# Patient Record
Sex: Female | Born: 1945
Health system: Southern US, Community
[De-identification: ages and names within clinical notes are randomized; demographics above are authoritative.]

## PROBLEM LIST (undated history)

## (undated) DIAGNOSIS — L570 Actinic keratosis: Secondary | ICD-10-CM

## (undated) DIAGNOSIS — R7303 Prediabetes: Secondary | ICD-10-CM

## (undated) DIAGNOSIS — J45909 Unspecified asthma, uncomplicated: Secondary | ICD-10-CM

## (undated) DIAGNOSIS — T7840XA Allergy, unspecified, initial encounter: Secondary | ICD-10-CM

## (undated) DIAGNOSIS — F419 Anxiety disorder, unspecified: Secondary | ICD-10-CM

## (undated) DIAGNOSIS — E785 Hyperlipidemia, unspecified: Secondary | ICD-10-CM

## (undated) DIAGNOSIS — I471 Supraventricular tachycardia, unspecified: Secondary | ICD-10-CM

## (undated) DIAGNOSIS — R011 Cardiac murmur, unspecified: Secondary | ICD-10-CM

## (undated) DIAGNOSIS — H269 Unspecified cataract: Secondary | ICD-10-CM

## (undated) DIAGNOSIS — T8859XA Other complications of anesthesia, initial encounter: Secondary | ICD-10-CM

## (undated) HISTORY — DX: Actinic keratosis: L57.0

## (undated) HISTORY — DX: Allergy, unspecified, initial encounter: T78.40XA

## (undated) HISTORY — PX: SKIN CANCER EXCISION: SHX779

## (undated) HISTORY — DX: Prediabetes: R73.03

## (undated) HISTORY — PX: FRACTURE SURGERY: SHX138

## (undated) HISTORY — DX: Hyperlipidemia, unspecified: E78.5

## (undated) HISTORY — DX: Cardiac murmur, unspecified: R01.1

## (undated) HISTORY — DX: Unspecified cataract: H26.9

---

## 1968-08-20 HISTORY — PX: APPENDECTOMY: SHX54

## 1968-08-20 HISTORY — PX: OOPHORECTOMY: SHX86

## 2019-11-10 ENCOUNTER — Ambulatory Visit (INDEPENDENT_AMBULATORY_CARE_PROVIDER_SITE_OTHER): Payer: Medicare Other | Admitting: Primary Care

## 2019-11-10 ENCOUNTER — Other Ambulatory Visit: Payer: Self-pay

## 2019-11-10 ENCOUNTER — Encounter: Payer: Self-pay | Admitting: Primary Care

## 2019-11-10 VITALS — BP 132/76 | HR 74 | Temp 98.2°F | Wt 120.0 lb

## 2019-11-10 DIAGNOSIS — E2839 Other primary ovarian failure: Secondary | ICD-10-CM | POA: Diagnosis not present

## 2019-11-10 DIAGNOSIS — E785 Hyperlipidemia, unspecified: Secondary | ICD-10-CM | POA: Diagnosis not present

## 2019-11-10 DIAGNOSIS — E7849 Other hyperlipidemia: Secondary | ICD-10-CM | POA: Insufficient documentation

## 2019-11-10 DIAGNOSIS — F419 Anxiety disorder, unspecified: Secondary | ICD-10-CM | POA: Diagnosis not present

## 2019-11-10 DIAGNOSIS — R011 Cardiac murmur, unspecified: Secondary | ICD-10-CM

## 2019-11-10 DIAGNOSIS — Z1231 Encounter for screening mammogram for malignant neoplasm of breast: Secondary | ICD-10-CM

## 2019-11-10 DIAGNOSIS — R768 Other specified abnormal immunological findings in serum: Secondary | ICD-10-CM

## 2019-11-10 LAB — COMPREHENSIVE METABOLIC PANEL
ALT: 19 U/L (ref 0–35)
AST: 22 U/L (ref 0–37)
Albumin: 4.5 g/dL (ref 3.5–5.2)
Alkaline Phosphatase: 122 U/L — ABNORMAL HIGH (ref 39–117)
BUN: 14 mg/dL (ref 6–23)
CO2: 30 mEq/L (ref 19–32)
Calcium: 9.9 mg/dL (ref 8.4–10.5)
Chloride: 102 mEq/L (ref 96–112)
Creatinine, Ser: 0.8 mg/dL (ref 0.40–1.20)
GFR: 70.17 mL/min (ref >60.00)
Glucose, Bld: 111 mg/dL — ABNORMAL HIGH (ref 70–99)
Potassium: 4.6 mEq/L (ref 3.5–5.1)
Sodium: 139 mEq/L (ref 135–145)
Total Bilirubin: 0.6 mg/dL (ref 0.2–1.2)
Total Protein: 7.9 g/dL (ref 6.0–8.3)

## 2019-11-10 LAB — CBC
HCT: 43 % (ref 36.0–46.0)
Hemoglobin: 14.2 g/dL (ref 12.0–15.0)
MCHC: 33.1 g/dL (ref 30.0–36.0)
MCV: 86.8 fl (ref 78.0–100.0)
Platelets: 248 10*3/uL (ref 150.0–400.0)
RBC: 4.95 Mil/uL (ref 3.87–5.11)
RDW: 13.8 % (ref 11.5–15.5)
WBC: 7.3 10*3/uL (ref 4.0–10.5)

## 2019-11-10 LAB — LIPID PANEL
Cholesterol: 339 mg/dL — ABNORMAL HIGH (ref 0–200)
HDL: 91.5 mg/dL (ref >39.00)
LDL Cholesterol: 228 mg/dL — ABNORMAL HIGH (ref 0–99)
NonHDL: 247.6
Total CHOL/HDL Ratio: 4
Triglycerides: 97 mg/dL (ref 0.0–149.0)
VLDL: 19.4 mg/dL (ref 0.0–40.0)

## 2019-11-10 NOTE — Assessment & Plan Note (Signed)
Evident on exam today, patient has no known history.  Echocardiogram pending.  Appears well. No exertional shortness of breath but some decreased "stamina"

## 2019-11-10 NOTE — Assessment & Plan Note (Signed)
Chronic for years, last labs with LDL of 184 in 2019. Refuses statin therapy.  Repeat lipids pending. Family history of stroke and hyperlipidemia in her mother.  Discussed potential effects of uncontrolled hyperlipidemia.

## 2019-11-10 NOTE — Patient Instructions (Addendum)
Stop by the lab prior to leaving today. I will notify you of your results once received.   Call the [facility] to schedule your mammogram and bone density scan.   You will be contacted regarding your echocardiogram.  Please let us know if you have not been contacted within two weeks.   It was a pleasure to meet you today! Please don't hesitate to call or message me with any questions. Welcome to [facility]!

## 2019-11-10 NOTE — Assessment & Plan Note (Signed)
Chronic and intermittent, mostly during travel. Uses very sparingly. Continue same.

## 2019-11-10 NOTE — Assessment & Plan Note (Signed)
Diagnosed several years ago, most recent reading in August 2019 of 1:640. Repeat ANA pending.

## 2019-11-10 NOTE — Progress Notes (Signed)
Subjective:    Patient ID: Cynthia Collins, female    DOB: September 25, 1945, 74 y.o.   MRN: [id number]  HPI  This visit occurred during the SARS-CoV-2 public health emergency.  Safety protocols were in place, including screening questions prior to the visit, additional usage of staff PPE, and extensive cleaning of exam room while observing appropriate contact time as indicated for disinfecting solutions.   Cynthia Collins is a 74 year old female who presents today to establish care and discuss the problems mentioned below. Will obtain/review records.  1) Hyperlipidemia: Diagnosed a few years ago. Last lipid panel was in August 2019, LDL of 184, HDL of 89, TC of 293. She takes a lot of OTC supplements for hyperlipidemia, against taking statin therapy due to intermittent muscle spasms.   Family history of hyperlipidemia, stroke, carotid artery disease in her mother. She endorses a healthy diet and stays active.   2) Positive ANA: Labs from August 2019 with positive ANA with titer of 1:640.   Once followed with rheumatology, tested negative for Sjogren's disease, Lupus. Family history of rheumatoid arthritis in her mother. Chronic history of muscle spasms that occur with increased activity. Occurs to entire body at times, taking [facility] with improvement. Historically has treated with acupuncture and massage therapy.   3) Agoraphobia/Anxiety: Uses Xanax very infrequently during travel.   BP Readings from Last 3 Encounters:  11/10/19 132/76     Review of Systems  Respiratory: Negative for shortness of breath.   Cardiovascular: Negative for chest pain.  Musculoskeletal: Positive for myalgias. Negative for arthralgias and joint swelling.  Skin: Negative for color change.  Neurological: Negative for dizziness, numbness and headaches.       Past Medical History:  Diagnosis Date  . Allergy   . Hyperlipidemia      Social History   Socioeconomic History  . Marital status: Divorced      Spouse name: Not on file  . Number of children: Not on file  . Years of education: Not on file  . Highest education level: Not on file  Occupational History  . Not on file  Tobacco Use  . Smoking status: Never Smoker  . Smokeless tobacco: Never Used  Substance and Sexual Activity  . Alcohol use: Yes    Alcohol/week: 1.0 standard drinks    Types: 1 Cans of beer per week    Comment: Once a month   . Drug use: Never  . Sexual activity: Not on file  Other Topics Concern  . Not on file  Social History Narrative  . Not on file   Social Determinants of Health   Financial Resource Strain:   . Difficulty of Paying Living Expenses:   Food Insecurity:   . Worried About Charity fundraiser in the Last Year:   . Arboriculturist in the Last Year:   Transportation Needs:   . Film/video editor (Medical):   Marland Kitchen Lack of Transportation (Non-Medical):   Physical Activity:   . Days of Exercise per Week:   . Minutes of Exercise per Session:   Stress:   . Feeling of Stress :   Social Connections:   . Frequency of Communication with Friends and Family:   . Frequency of Social Gatherings with Friends and Family:   . Attends Religious Services:   . Active Member of Clubs or Organizations:   . Attends Archivist Meetings:   Marland Kitchen Marital Status:   Intimate Partner Violence:   .  Fear of Current or Ex-Partner:   . Emotionally Abused:   Marland Kitchen Physically Abused:   . Sexually Abused:     Past Surgical History:  Procedure Laterality Date  . APPENDECTOMY  08/20/1968  . CESAREAN SECTION  08/20/1984  . OOPHORECTOMY Right 08/20/1968    Family History  Problem Relation Age of Onset  . Alcohol abuse Mother   . Arthritis Mother   . Heart disease Mother   . Hypertension Mother   . Mental illness Mother   . Stroke Mother   . Stroke Father   . Heart disease Father   . Hearing loss Father   . Alcohol abuse Sister   . Cancer Sister   . Heart disease Maternal Grandmother   .  Diabetes Maternal Grandfather   . Heart disease Paternal Grandmother     No Known Allergies  Current Outpatient Medications on File Prior to Visit  Medication Sig Dispense Refill  . ALPRAZolam (XANAX) 0.5 MG tablet Take 0.5 mg by mouth at bedtime as needed for anxiety.    . Ascorbic Acid (VITAMIN C) 1000 MG tablet Take 1,000 mg by mouth daily.    Marland Kitchen ELDERBERRY PO Take by mouth daily as needed.     No current facility-administered medications on file prior to visit.    BP 132/76 (BP Location: Left Arm, Patient Position: Sitting, Cuff Size: Small)   Pulse 74   Temp 98.2 F (36.8 C) (Temporal)   Wt 120 lb (54.4 kg)   SpO2 98%    Objective:   Physical Exam  Constitutional: She appears well-nourished.  Cardiovascular: Normal rate and regular rhythm.  Murmur heard. Murmur most prominent over aortic valve  Respiratory: Effort normal and breath sounds normal.  Musculoskeletal:     Cervical back: Neck supple.  Skin: Skin is warm and dry.  Psychiatric: She has a normal mood and affect.           Assessment & Plan:

## 2019-11-11 ENCOUNTER — Other Ambulatory Visit (INDEPENDENT_AMBULATORY_CARE_PROVIDER_SITE_OTHER): Payer: Medicare Other

## 2019-11-11 DIAGNOSIS — R739 Hyperglycemia, unspecified: Secondary | ICD-10-CM

## 2019-11-11 LAB — HEMOGLOBIN A1C: Hgb A1c MFr Bld: 5.8 % (ref 4.6–6.5)

## 2019-11-12 LAB — ANA: Anti Nuclear Antibody (ANA): POSITIVE — AB

## 2019-11-12 LAB — ANTI-NUCLEAR AB-TITER (ANA TITER)
ANA TITER: 1:80 {titer} — ABNORMAL HIGH
ANA Titer 1: 1:80 {titer} — ABNORMAL HIGH

## 2019-11-13 ENCOUNTER — Encounter: Payer: Self-pay | Admitting: *Deleted

## 2019-11-18 ENCOUNTER — Other Ambulatory Visit: Payer: Self-pay

## 2019-11-18 ENCOUNTER — Ambulatory Visit (INDEPENDENT_AMBULATORY_CARE_PROVIDER_SITE_OTHER): Payer: Medicare Other

## 2019-11-18 DIAGNOSIS — R011 Cardiac murmur, unspecified: Secondary | ICD-10-CM | POA: Diagnosis not present

## 2019-11-25 ENCOUNTER — Encounter: Payer: Self-pay | Admitting: Radiology

## 2019-11-25 ENCOUNTER — Ambulatory Visit
Admission: RE | Admit: 2019-11-25 | Discharge: 2019-11-25 | Disposition: A | Discharge status: Home / Self Care | Payer: Medicare Other | Source: Ambulatory Visit | Attending: Primary Care | Admitting: Primary Care

## 2019-11-25 DIAGNOSIS — Z1231 Encounter for screening mammogram for malignant neoplasm of breast: Secondary | ICD-10-CM | POA: Diagnosis present

## 2019-11-25 DIAGNOSIS — E2839 Other primary ovarian failure: Secondary | ICD-10-CM | POA: Diagnosis present

## 2019-11-30 ENCOUNTER — Ambulatory Visit: Payer: Medicare Other

## 2019-11-30 ENCOUNTER — Ambulatory Visit: Payer: Medicare Other | Attending: Internal Medicine

## 2019-11-30 ENCOUNTER — Other Ambulatory Visit: Payer: Self-pay

## 2019-11-30 DIAGNOSIS — Z23 Encounter for immunization: Secondary | ICD-10-CM

## 2019-11-30 NOTE — Progress Notes (Signed)
   Covid-19 Vaccination Clinic  Name:  Cynthia Collins    MRN: [id number] DOB: 11-Oct-1945  11/30/2019  Cynthia Collins was observed post Covid-19 immunization for 15 minutes without incident. She was provided with Vaccine Information Sheet and instruction to access the V-Safe system.   Cynthia Collins was instructed to call 911 with any severe reactions post vaccine: Marland Kitchen Difficulty breathing  . Swelling of face and throat  . A fast heartbeat  . A bad rash all over body  . Dizziness and weakness   Immunizations Administered    Name Date Dose VIS Date Route   Pfizer COVID-19 Vaccine 11/30/2019  7:07 PM 0.3 mL 07/31/2019 Intramuscular   Manufacturer: [place]   Lot: [id number]   [place]: [id number]

## 2019-12-18 ENCOUNTER — Ambulatory Visit: Payer: Medicare Other

## 2019-12-21 ENCOUNTER — Other Ambulatory Visit: Payer: Self-pay | Admitting: Primary Care

## 2019-12-21 ENCOUNTER — Ambulatory Visit (INDEPENDENT_AMBULATORY_CARE_PROVIDER_SITE_OTHER): Payer: Medicare Other | Admitting: Primary Care

## 2019-12-21 ENCOUNTER — Encounter: Payer: Self-pay | Admitting: Primary Care

## 2019-12-21 ENCOUNTER — Other Ambulatory Visit: Payer: Self-pay

## 2019-12-21 VITALS — BP 134/76 | HR 80 | Temp 96.5°F | Wt 118.5 lb

## 2019-12-21 DIAGNOSIS — E785 Hyperlipidemia, unspecified: Secondary | ICD-10-CM

## 2019-12-21 DIAGNOSIS — M810 Age-related osteoporosis without current pathological fracture: Secondary | ICD-10-CM

## 2019-12-21 DIAGNOSIS — M79622 Pain in left upper arm: Secondary | ICD-10-CM | POA: Diagnosis not present

## 2019-12-21 DIAGNOSIS — R011 Cardiac murmur, unspecified: Secondary | ICD-10-CM

## 2019-12-21 DIAGNOSIS — R5383 Other fatigue: Secondary | ICD-10-CM | POA: Diagnosis not present

## 2019-12-21 DIAGNOSIS — Z8249 Family history of ischemic heart disease and other diseases of the circulatory system: Secondary | ICD-10-CM

## 2019-12-21 DIAGNOSIS — R7303 Prediabetes: Secondary | ICD-10-CM

## 2019-12-21 DIAGNOSIS — N644 Mastodynia: Secondary | ICD-10-CM

## 2019-12-21 DIAGNOSIS — F419 Anxiety disorder, unspecified: Secondary | ICD-10-CM

## 2019-12-21 LAB — TSH: TSH: 1.28 u[IU]/mL (ref 0.35–4.50)

## 2019-12-21 NOTE — Patient Instructions (Signed)
You will be contacted regarding your referral to cardiology and the ultrasound of your arm.  Please let us know if you have not been contacted within two weeks.   It was a pleasure to see you today!

## 2019-12-21 NOTE — Assessment & Plan Note (Signed)
Recent echocardiogram grossly unremarkable.

## 2019-12-21 NOTE — Assessment & Plan Note (Signed)
LDL of 228 with HDL of 91.  Continues to refuse statin therapy. Given strong FH of heart disease, stroke, AAA, carotid artery stenosis, will refer to cardiology for evaluation.

## 2019-12-21 NOTE — Telephone Encounter (Signed)
Cynthia Collins, Cynthia Collins.

## 2019-12-21 NOTE — Assessment & Plan Note (Signed)
Noted on Bone Density scan from 2021, declines all Rx treatment despite recommendations. Compliant to calcium and vitamin D, also weight bearing exercise. Continue same.

## 2019-12-21 NOTE — Assessment & Plan Note (Addendum)
Chronic since February 2021, increased since Covid-19 vaccine three weeks ago, non provoked. Screening mammogram in early April 2021 negative.  Exam today without obvious mass to left axilla. Non tender. Will check ultrasound of left axilla.  ECG today with left axis, possible BBB?, no t-wave inversion or ST changes. No old ECG to compare.  Will also send to cardiology for cardiac work up given family history of heart disease/stroke coupled with LDL of 228. She has refused statin therapy on numerous occasions.

## 2019-12-21 NOTE — Progress Notes (Signed)
Subjective:    Patient ID: Cynthia Collins, female    DOB: 08-26-1945, 74 y.o.   MRN: [id number]  HPI  This visit occurred during the SARS-CoV-2 public health emergency.  Safety protocols were in place, including screening questions prior to the visit, additional usage of staff PPE, and extensive cleaning of exam room while observing appropriate contact time as indicated for disinfecting solutions.   Cynthia Collins is a 74 year old female with a history of hyperlipidemia, anxiety, cardiac murmur who presents today with a chief complaint of left axillary pain.   She has a chronic ache to her left upper extremity that began in December 2020 after moving to her new home. Her left axillary pain began after doing some demolition work on her house in February 2021. Over the last several weeks she's noticed increased discomfort to her left axilla with radiation to her left breast. Since then her left upper extremity pain has improved.   She had her Covid-19 vaccination three weeks ago, increased left axillary pain began about two weeks ago. She underwent a screening mammogram in early April 2021 which was negative.   She denies chest pain, nausea. She does admit to daily anxiety with an anxiety attack last week. She does note that she's "deteriorated" over the last year, feels more tired after activities, and has more muscle pain than before. She has a strong family history of stroke, carotid artery stenosis, AAA, and heart disease in both parents and brother.    Review of Systems  Eyes: Negative for visual disturbance.  Respiratory: Negative for shortness of breath.   Cardiovascular: Negative for chest pain.  Gastrointestinal: Negative for nausea.  Musculoskeletal: Positive for myalgias.       Left axillar fullness, left axillary discomfort with radiation across left chest  Neurological: Negative for dizziness.  Psychiatric/Behavioral: The patient is nervous/anxious.        Past Medical  History:  Diagnosis Date  . Allergy   . Hyperlipidemia   . Prediabetes      Social History   Socioeconomic History  . Marital status: Divorced    Spouse name: Not on file  . Number of children: Not on file  . Years of education: Not on file  . Highest education level: Not on file  Occupational History  . Not on file  Tobacco Use  . Smoking status: Never Smoker  . Smokeless tobacco: Never Used  Substance and Sexual Activity  . Alcohol use: Yes    Alcohol/week: 1.0 standard drinks    Types: 1 Cans of beer per week    Comment: Once a month   . Drug use: Never  . Sexual activity: Not on file  Other Topics Concern  . Not on file  Social History Narrative  . Not on file   Social Determinants of Health   Financial Resource Strain:   . Difficulty of Paying Living Expenses:   Food Insecurity:   . Worried About Charity fundraiser in the Last Year:   . Arboriculturist in the Last Year:   Transportation Needs:   . Film/video editor (Medical):   Marland Kitchen Lack of Transportation (Non-Medical):   Physical Activity:   . Days of Exercise per Week:   . Minutes of Exercise per Session:   Stress:   . Feeling of Stress :   Social Connections:   . Frequency of Communication with Friends and Family:   . Frequency of Social Gatherings with Friends and Family:   .  Attends Religious Services:   . Active Member of Clubs or Organizations:   . Attends Archivist Meetings:   Marland Kitchen Marital Status:   Intimate Partner Violence:   . Fear of Current or Ex-Partner:   . Emotionally Abused:   Marland Kitchen Physically Abused:   . Sexually Abused:     Past Surgical History:  Procedure Laterality Date  . APPENDECTOMY  08/20/1968  . CESAREAN SECTION  08/20/1984  . OOPHORECTOMY Right 08/20/1968    Family History  Problem Relation Age of Onset  . Alcohol abuse Mother   . Arthritis Mother   . Heart disease Mother   . Hypertension Mother   . Mental illness Mother   . Stroke Mother   .  Hypothyroidism Mother   . Stroke Father   . Heart disease Father   . Hearing loss Father   . Alcohol abuse Sister   . Lung cancer Sister   . Graves' disease Sister   . Heart disease Maternal Grandmother   . Diabetes Maternal Grandfather   . Heart disease Paternal Grandmother   . Breast cancer Neg Hx     No Known Allergies  Current Outpatient Medications on File Prior to Visit  Medication Sig Dispense Refill  . ALPRAZolam (XANAX) 0.5 MG tablet Take 0.5 mg by mouth at bedtime as needed for anxiety.    . Ascorbic Acid (VITAMIN C) 1000 MG tablet Take 1,000 mg by mouth daily.    Marland Kitchen ELDERBERRY PO Take by mouth daily as needed.    . Hyaluronic Acid-Vitamin C (HYALURONIC ACID PO) Take 100 mg by mouth daily.    . Misc Natural Products (LUTEIN 20 PO) Take 20 mg by mouth daily.    . Misc Natural Products (WHITE WILLOW BARK PO) Take by mouth. Patient states she takes this for pain PRN    . Nattokinase 100 MG CAPS Take 100 mg by mouth daily.    . NON FORMULARY Take 1 drop by mouth daily. [employer] Supreme    . RESVERATROL PO Take 1 capsule by mouth 3 (three) times a week.    . Zinc 50 MG CAPS Take 50 mg by mouth as needed.     No current facility-administered medications on file prior to visit.    BP 134/76   Pulse 80   Temp (!) 96.5 F (35.8 C) (Temporal)   Wt 118 lb 8 oz (53.8 kg)   SpO2 98%    Objective:   Physical Exam  Constitutional: She appears well-nourished.  Neck: Carotid bruit is present.  Cardiovascular: Normal rate and regular rhythm.  Murmur heard. Respiratory: Effort normal and breath sounds normal.  Musculoskeletal:     Cervical back: Neck supple.  Skin: Skin is warm and dry.  Psychiatric: She has a normal mood and affect.           Assessment & Plan:

## 2019-12-21 NOTE — Assessment & Plan Note (Signed)
Chronic over the last year which is a change from her normal. Family history of thyroid disease in sister. Checking TSH today.

## 2019-12-22 ENCOUNTER — Encounter: Payer: Self-pay | Admitting: Cardiovascular Disease

## 2019-12-22 ENCOUNTER — Ambulatory Visit (INDEPENDENT_AMBULATORY_CARE_PROVIDER_SITE_OTHER): Payer: Medicare Other | Admitting: Cardiovascular Disease

## 2019-12-22 VITALS — BP 160/74 | HR 78 | Ht 60.0 in | Wt 119.4 lb

## 2019-12-22 DIAGNOSIS — E785 Hyperlipidemia, unspecified: Secondary | ICD-10-CM | POA: Diagnosis not present

## 2019-12-22 DIAGNOSIS — Z136 Encounter for screening for cardiovascular disorders: Secondary | ICD-10-CM

## 2019-12-22 NOTE — Progress Notes (Signed)
Cardiology Office Note   Date:  12/23/2019   ID:  Cynthia Collins, DOB Mar 18, 1946, MRN [id number]  PCP:  Pleas Koch, NP  Cardiologist:   Kathlyn Sacramento, MD   Chief Complaint  Patient presents with  . Other    Left arm pain, HLD, heart murmur and Fam Hx CAD.      History of Present Illness: Cynthia Collins is a 74 y.o. female who referred by Alma Friendly for evaluation of cardiovascular disease given known history of severe hyperlipidemia.  Patient has no previous cardiac history.  She moved from [place] to be closer to her son.  She has known history of anxiety for many years and has known white coat syndrome.  She also has history of hyperlipidemia with elevated LDL and an HDL.  Her LDL is greater than 200.  She has history of positive ANA and suffers from chronic aches and pains and thus she refuses to go on a statin.  There is family history of cardiovascular disease.  Her father died of myocardial infarction.  Her mother had carotid disease and died of heart failure.  She is very active and walks 4 miles a day.  She was found to have a faint heart murmur and had an echocardiogram done in March which showed no significant valvular abnormalities.  She received COVID-19 vaccine about a month ago and had some worsening of pain in the left axilla 2 weeks after that and thus she is now hesitant about taking the second dose.    Past Medical History:  Diagnosis Date  . Allergy   . Heart murmur   . Hyperlipidemia   . Prediabetes     Past Surgical History:  Procedure Laterality Date  . APPENDECTOMY  08/20/1968  . CESAREAN SECTION  08/20/1984  . OOPHORECTOMY Right 08/20/1968     Current Outpatient Medications  Medication Sig Dispense Refill  . ALPRAZolam (XANAX) 0.5 MG tablet Take 0.5 mg by mouth at bedtime as needed for anxiety.    . Ascorbic Acid (VITAMIN C PO) Take by mouth daily.    . Ascorbic Acid (VITAMIN C) 1000 MG tablet Take 1,000 mg by mouth daily.      . Calcium Carbonate-Vitamin D (LIQUID CALCIUM/VITAMIN D PO) Take by mouth daily.    . Coenzyme Q10 (CO Q 10 PO) Take by mouth. Three times weekly.    Marland Kitchen ELDERBERRY PO Take by mouth daily as needed.    . Hyaluronic Acid-Vitamin C (HYALURONIC ACID PO) Take 100 mg by mouth daily.    . Menaquinone-7 (VITAMIN K2 PO) Take by mouth. 3 DAYS WEEKLY    . Misc Natural Products (LUTEIN 20 PO) Take 20 mg by mouth daily.    . Misc Natural Products (WHITE WILLOW BARK PO) Take by mouth. Patient states she takes this for pain PRN    . Multiple Vitamin (MULTIVITAMIN) tablet Take 1 tablet by mouth daily.    . Nattokinase 100 MG CAPS Take 100 mg by mouth daily.    . NON FORMULARY Take 1 drop by mouth daily. [employer] Supreme    . NON FORMULARY Bone Up 1 capsule bid.    . NON FORMULARY BCQ 3 days a week.    . Omega-3 Fatty Acids (FISH OIL PO) Take by mouth. Three times a week.    Marland Kitchen RESVERATROL PO Take 1 capsule by mouth 3 (three) times a week.    . Zinc 50 MG CAPS Take 50 mg by mouth as needed.  No current facility-administered medications for this visit.    Allergies:   Patient has no known allergies.    Social History:  The patient  reports that she has never smoked. She has never used smokeless tobacco. She reports current alcohol use of about 1.0 standard drinks of alcohol per week. She reports that she does not use drugs.   Family History:  The patient's family history includes Alcohol abuse in her mother and sister; Arthritis in her mother; Diabetes in her maternal grandfather; Berenice Primas' disease in her sister; Hearing loss in her father; Heart disease in her father, maternal grandmother, mother, and paternal grandmother; Hypertension in her mother; Hypothyroidism in her mother; Lung cancer in her sister; Mental illness in her mother; Stroke in her father and mother.    ROS:  Please see the history of present illness.   Otherwise, review of systems are positive for none.   All other systems are reviewed  and negative.    PHYSICAL EXAM: VS:  BP (!) 160/74 (BP Location: Right Arm, Patient Position: Sitting, Cuff Size: Normal)   Pulse 78   Ht 5' (1.524 m)   Wt 119 lb 6 oz (54.1 kg)   SpO2 98%   BMI 23.31 kg/m  , BMI Body mass index is 23.31 kg/m. GEN: Well nourished, well developed, in no acute distress  HEENT: normal  Neck: no JVD, carotid bruits, or masses Cardiac: RRR; no rubs, or gallops,no edema .  1 out of 6 systolic murmur at the base Respiratory:  clear to auscultation bilaterally, normal work of breathing GI: soft, nontender, nondistended, + BS MS: no deformity or atrophy  Skin: warm and dry, no rash Neuro:  Strength and sensation are intact Psych: euthymic mood, full affect   EKG:  EKG is ordered today. The ekg ordered today demonstrates normal sinus rhythm with left axis deviation   Recent Labs: 11/10/2019: ALT 19; BUN 14; Creatinine, Ser 0.80; Hemoglobin 14.2; Platelets 248.0; Potassium 4.6; Sodium 139 12/21/2019: TSH 1.28    Lipid Panel    Component Value Date/Time   CHOL 339 (H) 11/10/2019 1055   TRIG 97.0 11/10/2019 1055   HDL 91.50 11/10/2019 1055   CHOLHDL 4 11/10/2019 1055   VLDL 19.4 11/10/2019 1055   LDLCALC 228 (H) 11/10/2019 1055      Wt Readings from Last 3 Encounters:  12/22/19 119 lb 6 oz (54.1 kg)  12/21/19 118 lb 8 oz (53.8 kg)  11/10/19 120 lb (54.4 kg)       PAD Screen 12/22/2019  Previous PAD dx? No  Previous surgical procedure? No  Pain with walking? No  Feet/toe relief with dangling? No  Painful, non-healing ulcers? No  Extremities discolored? No      ASSESSMENT AND PLAN:  1.  Screening for cardiovascular disease: The patient is suspected of having familial hyperlipidemia given extremely elevated LDL.  I discussed different options for screening for heart disease I think the best test is coronary calcium score.  She is agreeable to proceed.  This test will help [place] risk stratify her management.  2.  Pain in the left axillary  area: The patient is getting further work-up with ultrasound and mammogram.  This is not cardiac.  3.  Familial hyperlipidemia: LDL was 228 but at the same time her HDL was 92.  Risk stratify with a coronary calcium score as outlined above.  The patient has refused statins in the past and reports that she had myalgia with supplements to treat hyperlipidemia likely containing  red yeast rice.  I explained to her that there are different options for treating hyperlipidemia including Zetia and PCSK9 inhibitors .  4. Health maintenance: I had a long discussion with the patient about the advantages of taking the second dose of COVID-19 infection   Disposition:   FU with me in 1 month  Signed,  Kathlyn Sacramento, MD  12/23/2019 4:10 PM    [place]

## 2019-12-22 NOTE — Patient Instructions (Signed)
Medication Instructions:  Your physician recommends that you continue on your current medications as directed. Please refer to the Current Medication list given to you today.  *If you need a refill on your cardiac medications before your next appointment, please call your pharmacy*   Lab Work: None ordered If you have labs (blood work) drawn today and your tests are completely normal, you will receive your results only by: Marland Kitchen MyChart Message (if you have MyChart) OR . A paper copy in the mail If you have any lab test that is abnormal or we need to change your treatment, we will call you to review the results.   Testing/Procedures: Dr. Fletcher Anon has recommended you have a CT Calcium Score. The out of pocket cost is $150.  Please call [phone] to schedule  [place], [place]   Follow-Up: At [facility], you and your health needs are our priority.  As part of our continuing mission to provide you with exceptional heart care, we have created designated Provider Care Teams.  These Care Teams include your primary Cardiologist (physician) and Advanced Practice Providers (APPs -  Physician Assistants and Nurse Practitioners) who all work together to provide you with the care you need, when you need it.  We recommend signing up for the patient portal called "MyChart".  Sign up information is provided on this After Visit Summary.  MyChart is used to connect with patients for Virtual Visits (Telemedicine).  Patients are able to view lab/test results, encounter notes, upcoming appointments, etc.  Non-urgent messages can be sent to your provider as well.   To learn more about what you can do with MyChart, go to [email or web address].    Your next appointment:   4 week(s)  The format for your next appointment:   In Person  Provider:    You may see No primary care provider on file. Dr. Fletcher Anon or one of the following Advanced  Practice Providers on your designated Care Team:    Murray Hodgkins, NP  Christell Faith, PA-C  Marrianne Mood, PA-C    Other Instructions Dr.Arida has ordered a CT coronary calcium score. This is $150 out of pocket.   Coronary CalciumScan A coronary calcium scan is an imaging test used to look for deposits of calcium and other fatty materials (plaques) in the inner lining of the blood vessels of the heart (coronary arteries). These deposits of calcium and plaques can partly clog and narrow the coronary arteries without producing any symptoms or warning signs. This puts a person at risk for a heart attack. This test can detect these deposits before symptoms develop. Tell a health care provider about:  Any allergies you have.  All medicines you are taking, including vitamins, herbs, eye drops, creams, and over-the-counter medicines.  Any problems you or family members have had with anesthetic medicines.  Any blood disorders you have.  Any surgeries you have had.  Any medical conditions you have.  Whether you are pregnant or may be pregnant. What are the risks? Generally, this is a safe procedure. However, problems may occur, including:  Harm to a pregnant woman and her unborn baby. This test involves the use of radiation. Radiation exposure can be dangerous to a pregnant woman and her unborn baby. If you are pregnant, you generally should not have this procedure done.  Slight increase in the risk of cancer. This is because of the radiation involved in the test. What happens before the procedure? No  preparation is needed for this procedure. What happens during the procedure?  You will undress and remove any jewelry around your neck or chest.  You will put on a hospital gown.  Sticky electrodes will be placed on your chest. The electrodes will be connected to an electrocardiogram (ECG) machine to record a tracing of the electrical activity of your heart.  A CT scanner will  take pictures of your heart. During this time, you will be asked to lie still and hold your breath for 2-3 seconds while a picture of your heart is being taken. The procedure may vary among health care providers and hospitals. What happens after the procedure?  You can get dressed.  You can return to your normal activities.  It is up to you to get the results of your test. Ask your health care provider, or the department that is doing the test, when your results will be ready. Summary  A coronary calcium scan is an imaging test used to look for deposits of calcium and other fatty materials (plaques) in the inner lining of the blood vessels of the heart (coronary arteries).  Generally, this is a safe procedure. Tell your health care provider if you are pregnant or may be pregnant.  No preparation is needed for this procedure.  A CT scanner will take pictures of your heart.  You can return to your normal activities after the scan is done. This information is not intended to replace advice given to you by your health care provider. Make sure you discuss any questions you have with your health care provider. Document Released: 02/02/2008 Document Revised: 06/25/2016 Document Reviewed: 06/25/2016 Elsevier Interactive Patient Education  2017 [employer].

## 2019-12-28 ENCOUNTER — Ambulatory Visit
Admission: RE | Admit: 2019-12-28 | Discharge: 2019-12-28 | Disposition: A | Discharge status: Home / Self Care | Payer: Medicare Other | Source: Ambulatory Visit | Attending: Cardiovascular Disease | Admitting: Cardiovascular Disease

## 2019-12-28 ENCOUNTER — Other Ambulatory Visit: Payer: Self-pay

## 2019-12-28 DIAGNOSIS — E785 Hyperlipidemia, unspecified: Secondary | ICD-10-CM | POA: Insufficient documentation

## 2019-12-29 ENCOUNTER — Ambulatory Visit
Admission: RE | Admit: 2019-12-29 | Discharge: 2019-12-29 | Disposition: A | Discharge status: Home / Self Care | Payer: Medicare Other | Source: Ambulatory Visit | Attending: Primary Care | Admitting: Primary Care

## 2019-12-29 DIAGNOSIS — N644 Mastodynia: Secondary | ICD-10-CM | POA: Diagnosis present

## 2019-12-29 DIAGNOSIS — M79622 Pain in left upper arm: Secondary | ICD-10-CM | POA: Diagnosis not present

## 2019-12-30 ENCOUNTER — Ambulatory Visit: Payer: Medicare Other | Attending: Internal Medicine

## 2019-12-30 ENCOUNTER — Ambulatory Visit: Payer: Medicare Other

## 2019-12-30 DIAGNOSIS — Z23 Encounter for immunization: Secondary | ICD-10-CM

## 2019-12-30 NOTE — Progress Notes (Signed)
   Covid-19 Vaccination Clinic  Name:  Cynthia Collins    MRN: [id number] DOB: 1946-06-15  12/30/2019  [name] was observed post Covid-19 immunization for 15 minutes without incident. She was provided with Vaccine Information Sheet and instruction to access the V-Safe system.   [name] was instructed to call 911 with any severe reactions post vaccine: Marland Kitchen Difficulty breathing  . Swelling of face and throat  . A fast heartbeat  . A bad rash all over body  . Dizziness and weakness   Immunizations Administered    Name Date Dose VIS Date Route   Pfizer COVID-19 Vaccine 12/30/2019  1:58 PM 0.3 mL 10/14/2018 Intramuscular   Manufacturer: [place]   Lot: [id number]   Platinum: [id number]

## 2019-12-31 ENCOUNTER — Telehealth: Payer: Self-pay

## 2019-12-31 DIAGNOSIS — E785 Hyperlipidemia, unspecified: Secondary | ICD-10-CM

## 2019-12-31 MED ORDER — EZETIMIBE 10 MG PO TABS
10.0000 mg | ORAL_TABLET | Freq: Every day | ORAL | 5 refills | Status: DC
Start: 2019-12-31 — End: 2020-03-08

## 2019-12-31 NOTE — Telephone Encounter (Signed)
Patient made aware of her  coronary calcium score results and Dr. Tyrell Antonio recommendation. Patient is agreeable with starting Zetia 10 mg daily. Patient will [facility] her fasting lipid and [facility]  Patient will keep her 01/19/20 appt with Laurann Montana, NP. She will discuss proceeding with a stress test at her appt, she sts that she is not opposed to it. Patient verbalized understanding and voiced appreciation for the call.

## 2019-12-31 NOTE — Telephone Encounter (Signed)
-----   Message from Wellington Hampshire, MD sent at 12/30/2019  3:36 PM EDT ----- Inform patient that coronary calcium score was extremely high.  We have to treat her hyperlipidemia.  She reports inability to take statins.  Recommend adding Zetia 10 mg daily and repeat lipid and liver profile in 2 months.  I suspect that ultimately she will require treatment with a PCSK9 inhibitor. We should also consider doing a stress test to make sure she has no obstructive disease given the previous calcifications.  This can be discussed with her during his follow-up appointment with [facility] in few weeks.

## 2020-01-04 MED ORDER — SERTRALINE HCL 50 MG PO TABS: 50.0000 mg | ORAL_TABLET | Freq: Every day | ORAL | 1 refills | Status: DC

## 2020-01-19 ENCOUNTER — Encounter: Payer: Self-pay | Admitting: Family

## 2020-01-19 ENCOUNTER — Other Ambulatory Visit: Payer: Self-pay

## 2020-01-19 ENCOUNTER — Ambulatory Visit (INDEPENDENT_AMBULATORY_CARE_PROVIDER_SITE_OTHER): Payer: Medicare Other | Admitting: Family

## 2020-01-19 VITALS — BP 146/90 | HR 83 | Ht 60.0 in | Wt 119.5 lb

## 2020-01-19 DIAGNOSIS — E785 Hyperlipidemia, unspecified: Secondary | ICD-10-CM

## 2020-01-19 DIAGNOSIS — Z8249 Family history of ischemic heart disease and other diseases of the circulatory system: Secondary | ICD-10-CM

## 2020-01-19 DIAGNOSIS — I2510 Atherosclerotic heart disease of native coronary artery without angina pectoris: Secondary | ICD-10-CM | POA: Diagnosis not present

## 2020-01-19 DIAGNOSIS — F419 Anxiety disorder, unspecified: Secondary | ICD-10-CM

## 2020-01-19 MED ORDER — ASPIRIN EC 81 MG PO TBEC
81.0000 mg | DELAYED_RELEASE_TABLET | Freq: Every day | ORAL | 0 refills | Status: DC
Start: 2020-01-19 — End: 2020-11-08

## 2020-01-19 NOTE — Progress Notes (Signed)
Office Visit    Patient Name: Cynthia Collins Date of Encounter: 01/19/2020  Primary Care Provider:  Pleas Koch, NP Primary Cardiologist:  Kathlyn [place], MD Electrophysiologist:  None   Chief Complaint    Cynthia Collins is a 74 y.o. female with a hx of severe hyperlipidemia, anxiety, whitecoat syndrome, family history notable for CAD presents today for follow-up after cardiac CTA  Past Medical History    Past Medical History:  Diagnosis Date  . Allergy   . Heart murmur   . Hyperlipidemia   . Prediabetes    Past Surgical History:  Procedure Laterality Date  . APPENDECTOMY  08/20/1968  . CESAREAN SECTION  08/20/1984  . OOPHORECTOMY Right 08/20/1968    Allergies  No Known Allergies  History of Present Illness    Cynthia Collins is a 74 y.o. female with a hx of  severe hyperlipidemia, anxiety, whitecoat syndrome, heart murmur with normal echo 10/2019, family history notable for CAD.  She was last seen 12/22/2019 by Dr. Fletcher Anon.  Family history notable for CAD with father having passed of MI.  Her mother had carotid disease and died of heart failure.  Echocardiogram 10/2019 in setting of faint heart murmur showed no significant valvular normalities.  She was referred to Dr. Fletcher Anon for evaluation of cardiovascular disease given severe hyperlipidemia.  Her LDL is greater than 200.  She has chronic aches and pain and has refused statin. Recommended for coronary calcium score - showed coronary calcium score 1118 placing her in 96th percentile for age/sex. Recommended for treatment of HLD.   She is active and walks 4 miles per day without exertional dyspnea or chest pain.  ANA score 1280 a number of years ago. Saw rheumatologist and all testing was negative. Tells me her muscle system that hurts presently is her left arm and between her shoulder blades that has been ongoing for 6 months. Previously managed with therapist, massage therapist, and acupuncturist.  Has not  reestablished with these provider since moving to the area.  BP at home 113/67. Monitors carefully at home with arm blood pressure cuff.   Tells me she was fine until she had testing done but now notes that she has some anxiety about her heart. Has discussed with her niece who she is close with.   Works out daily with 10 pound weights. Tells me last week she had a vertical sense of "discomfort" in her mid sternum that sent her into anxiety that lasted 5 minutes.  Occurred mid exercise routine but has not recurred since.  List of multiple questions does endorse she is anxious.  Encouraged her to continue to take PO calcium as recommended by PCP for low bone density studies.  We discussed oral calcium would not contribute to coronary calcification.  In discussing cholesterol she asks if she can eat 1 egg per day, told her this was fine.  Tells me she previously thought her cholesterol was fine since her HDL was always good. Cut back on fish oil to 3 days per week when starting Zetia at her own discretion. Tells me her HDL went up after being on fish oil. Presently on 3 days per week.  Has used xanax for >40 years. Family history notable for addiction per her report. Tells me she uses xanax as a "safety blanket".  Was very hesitant regarding the Zoloft that her primary care provider for her.  Long discussion regarding the difference between long-acting anxiety medications and rescue medications.  Encouraged her to trial.  We discussed that it may take 4 to 6 weeks for her to notice a difference.  Encouraged to follow closely with her primary care provider.  We discussed the limitations on primary care providers regarding Xanax and encouraged her to request referral to psychiatry if she feels she needs one for Xanax.  No suicidal ideation noted today.     EKGs/Labs/Other Studies Reviewed:   The following studies were reviewed today:  Echo 11/18/19 1. Left ventricular ejection fraction, by  estimation, is 55 to 60%. The  left ventricle has normal function. The left ventricle has no regional  wall motion abnormalities. Left ventricular diastolic parameters are  consistent with Grade I diastolic  dysfunction (impaired relaxation).   2. Right ventricular systolic function is normal. The right ventricular  size is normal. There is normal pulmonary artery systolic pressure.   3. The mitral valve is normal in structure. No evidence of mitral valve  regurgitation. No evidence of mitral stenosis.   4. The aortic valve is tricuspid. Aortic valve regurgitation is not  visualized. Mild aortic valve sclerosis is present, with no evidence of  aortic valve stenosis.   5. The inferior vena cava is normal in size with greater than 50%  respiratory variability, suggesting right atrial pressure of 3 mmHg.   CT calcium score 12/2019 IMPRESSION: 1. Coronary calcium score of 1118 . This was 96th percentile for age and sex matched control.   2. High calcium score. Recommend guideline directed medical therapy and risk factor modification.  EKG:  EKG is ordered today.  The ekg ordered today demonstrates NSR 83 bpm with left axis deviation no acute ST/T wave changes  Recent Labs: 11/10/2019: ALT 19; BUN 14; Creatinine, Ser 0.80; Hemoglobin 14.2; Platelets 248.0; Potassium 4.6; Sodium 139 12/21/2019: TSH 1.28  Recent Lipid Panel    Component Value Date/Time   CHOL 339 (H) 11/10/2019 1055   TRIG 97.0 11/10/2019 1055   HDL 91.50 11/10/2019 1055   CHOLHDL 4 11/10/2019 1055   VLDL 19.4 11/10/2019 1055   LDLCALC 228 (H) 11/10/2019 1055    Home Medications   Current Meds  Medication Sig  . ALPRAZolam (XANAX) 0.5 MG tablet Take 0.5 mg by mouth at bedtime as needed for anxiety.  . Ascorbic Acid (VITAMIN C) 1000 MG tablet Take 1,000 mg by mouth daily.  . Coenzyme Q10 (CO Q 10 PO) Take by mouth. Three times weekly.  Marland Kitchen ELDERBERRY PO Take by mouth daily as needed.  . ezetimibe (ZETIA) 10 MG tablet  Take 1 tablet (10 mg total) by mouth daily.  Marland Kitchen Hyaluronic Acid-Vitamin C (HYALURONIC ACID PO) Take 100 mg by mouth daily.  . Menaquinone-7 (VITAMIN K2 PO) Take by mouth. 3 DAYS WEEKLY  . Misc Natural Products (LUTEIN 20 PO) Take 20 mg by mouth daily.  . Misc Natural Products (WHITE WILLOW BARK PO) Take by mouth. Patient states she takes this for pain PRN  . Multiple Vitamin (MULTIVITAMIN) tablet Take 1 tablet by mouth daily.  . NON FORMULARY Take 1 drop by mouth daily. Haw[place]orn Supreme  . NON FORMULARY Bone Up 1 capsule bid.  . NON FORMULARY BCQ 3 days a week.  . Omega-3 Fatty Acids (FISH OIL PO) Take by mouth. Three times a week.  Marland Kitchen RESVERATROL PO Take 1 capsule by mouth 3 (three) times a week.  Marland Kitchen VITAMIN D, CHOLECALCIFEROL, PO Take by mouth daily.  . Zinc 50 MG CAPS Take 50 mg by mouth as needed.  . [DISCONTINUED] Nattokinase 100 MG  CAPS Take 100 mg by mouth daily.     Review of Systems      Review of Systems  Constitution: Negative for chills, fever and malaise/fatigue.  Cardiovascular: Negative for chest pain, dyspnea on exertion, irregular heartbeat, leg swelling, near-syncope, orthopnea, palpitations and syncope.  Respiratory: Negative for cough, shortness of breath and wheezing.   Gastrointestinal: Negative for melena, nausea and vomiting.  Genitourinary: Negative for hematuria.  Neurological: Negative for dizziness, light-headedness and weakness.  Psychiatric/Behavioral: The patient is nervous/anxious.    All other systems reviewed and are otherwise negative except as noted above.  Physical Exam    VS:  BP (!) 146/90 (BP Location: Left Arm, Patient Position: Sitting, Cuff Size: Normal)   Pulse 83   Ht 5' (1.524 m)   Wt 119 lb 8 oz (54.2 kg)   SpO2 98%   BMI 23.34 kg/m  , BMI Body mass index is 23.34 kg/m. GEN: Well nourished, well developed, in no acute distress. HEENT: normal. Neck: Supple, no JVD, carotid bruits, or masses. Cardiac: RRR, no murmurs, rubs, or  gallops. No clubbing, cyanosis, edema.  Radials/DP/PT 2+ and equal bilaterally.  Respiratory:  Respirations regular and unlabored, clear to auscultation bilaterally. GI: Soft, nontender, nondistended, BS + x 4. MS: No deformity or atrophy. Skin: Warm and dry, no rash. Neuro:  Strength and sensation are intact. Psych: Normal affect.  Assessment & Plan    1. Coronary calcium on CT - Coronary calcium score 1118 placing her 96th percentile for risk. Hx intolerance to statin.  Start aspirin 81 mg daily. She will stop her previous Natokinase that she took as prescribed by naturopath as she was told that it is similar to aspirin.  She was provided samples. Has had one episode of chest pain that was fleeting and described as "sharp vertical discomfort" in her midsternal area. Walks 4 miles per day without chest pain, dyspnea. Plan for Lexiscan Myoview to rule out ischemia. Lipid management, as below. Recommend continuing heart healthy diet and regular cardiovascular exercise.   2. Familial hyperlipidemia -Most recent labs with LDL 228 and HDL 92. Hx of intolerance to statin and hx extremely elevated ANA. Tolerating Zetia 10mg  daily. Plan for repeat labs 03/01/20. If LDL remains <70, plan for referral to lipid clinic for consideration of PCSK9i.   3. Pain in left axillary area - Noncardiac.  Undergoing work-up with primary care provider.  4. Anxiety - Long discussion today.  Encouraged to start Zoloft as recommended by her primary care provider.  5. Family history carotid disease -multiple family members on her mother side with carotid endarterectomy.  No bruit noted on exam today.  Lipid management, as above.  May benefit from screening duplex in the future.  Will discuss at follow-up.  Disposition: Follow up in 6 week(s) with Dr. Fletcher Anon or APP.   Loel Dubonnet, NP 01/19/2020, 9:02 PM

## 2020-01-19 NOTE — Patient Instructions (Addendum)
Your calcium score showed your coronary calcium score was 118 placing you in 96th percentile for risk.   Medication Instructions:  Your physician has recommended you make the following change in your medication:   1. Stop natokinase.  2. Start Aspirin 81 mg take one tablet daily.   Medication Samples have been provided to the patient.  Drug name: Aspirin      Strength: 81 mg         Qty: 2 Boxes  LOT: [id number]  Exp.Date: 11/21   The patient has been instructed regarding the correct time, dose, and frequency of taking this medication, including desired effects and most common side effects.   Dolores Lory 3:37 PM 01/19/2020  *If you need a refill on your cardiac medications before your next appointment, please call your pharmacy*   Lab Work: No blood work ordered today.   If you have labs (blood work) drawn today and your tests are completely normal, you will receive your results only by: Marland Kitchen MyChart Message (if you have MyChart) OR . A paper copy in the mail If you have any lab test that is abnormal or we need to change your treatment, we will call you to review the results.   Testing/Procedures: Your physician has requested that you have a lexiscan myoview.  [place]  Your caregiver has ordered a Stress Test with nuclear imaging. The purpose of this test is to evaluate the blood supply to your heart muscle. This procedure is referred to as a "Non-Invasive Stress Test." This is because other than having an IV started in your vein, nothing is inserted or "invades" your body. Cardiac stress tests are done to find areas of poor blood flow to the heart by determining the extent of coronary artery disease (CAD). Some patients exercise on a treadmill, which naturally increases the blood flow to your heart, while others who are  unable to walk on a treadmill due to physical limitations have a pharmacologic/chemical stress agent called Lexiscan . This medicine will mimic walking on a  treadmill by temporarily increasing your coronary blood flow.   Please note: these test may take anywhere between 2-4 hours to complete  PLEASE REPORT TO [facility] AT THE FIRST DESK WILL DIRECT YOU WHERE TO GO  Date of Procedure:_____________________________________  Arrival Time for Procedure:______________________________  Instructions regarding medication:   _x___ :  You may take all your medications the morning of your procedure with enough water to get the pill down safely.   PLEASE NOTIFY THE OFFICE AT LEAST 82 HOURS IN ADVANCE IF YOU ARE UNABLE TO KEEP YOUR APPOINTMENT.  [phone] AND  PLEASE NOTIFY NUCLEAR MEDICINE AT [facility] AT LEAST 24 HOURS IN ADVANCE IF YOU ARE UNABLE TO KEEP YOUR APPOINTMENT. [phone]  How to prepare for your Myoview test:  1. Do not eat or drink after midnight 2. No caffeine for 24 hours prior to test 3. No smoking 24 hours prior to test. 4. Your medication may be taken with water.  If your doctor stopped a medication because of this test, do not take that medication. 5. Ladies, please do not wear dresses.  Skirts or pants are appropriate. Please wear a short sleeve shirt. 6. No perfume, cologne or lotion. 7. Wear comfortable walking shoes. No heels!       Follow-Up: At [facility], you and your health needs are our priority.  As part of our continuing mission to provide you with exceptional heart care, we  have created designated Provider Care Teams.  These Care Teams include your primary Cardiologist (physician) and Advanced Practice Providers (APPs -  Physician Assistants and Nurse Practitioners) who all work together to provide you with the care you need, when you need it.  We recommend signing up for the patient portal called "MyChart".  Sign up information is provided on this After Visit Summary.  MyChart is used to connect with patients for Virtual Visits (Telemedicine).  Patients are able to view  lab/test results, encounter notes, upcoming appointments, etc.  Non-urgent messages can be sent to your provider as well.   To learn more about what you can do with MyChart, go to [email or web address].    Your next appointment:   6 week(s)  The format for your next appointment:   In Person  Provider:    You may see Kathlyn Sacramento, M.D. or one of the following Advanced Practice Providers on your designated Care Team:    Murray Hodgkins, NP  Christell Faith, PA-C  Marrianne Mood, PA-C    Other Instructions  Cardiac Nuclear Scan A cardiac nuclear scan is a test that is done to check the flow of blood to your heart. It is done when you are resting and when you are exercising. The test looks for problems such as:  Not enough blood reaching a portion of the heart.  The heart muscle not working as it should. You may need this test if:  You have heart disease.  You have had lab results that are not normal.  You have had heart surgery or a balloon procedure to open up blocked arteries (angioplasty).  You have chest pain.  You have shortness of breath. In this test, a special dye (tracer) is put into your bloodstream. The tracer will travel to your heart. A camera will then take pictures of your heart to see how the tracer moves through your heart. This test is usually done at a hospital and takes 2-4 hours. Tell a doctor about:  Any allergies you have.  All medicines you are taking, including vitamins, herbs, eye drops, creams, and over-the-counter medicines.  Any problems you or family members have had with anesthetic medicines.  Any blood disorders you have.  Any surgeries you have had.  Any medical conditions you have.  Whether you are pregnant or may be pregnant. What are the risks? Generally, this is a safe test. However, problems may occur, such as:  Serious chest pain and heart attack. This is only a risk if the stress portion of the test is done.  Rapid  heartbeat.  A feeling of warmth in your chest. This feeling usually does not last long.  Allergic reaction to the tracer. What happens before the test?  Ask your doctor about changing or stopping your normal medicines. This is important.  Follow instructions from your doctor about what you cannot eat or drink.  Remove your jewelry on the day of the test. What happens during the test?  An IV tube will be inserted into one of your veins.  Your doctor will give you a small amount of tracer through the IV tube.  You will wait for 20-40 minutes while the tracer moves through your bloodstream.  Your heart will be monitored with an electrocardiogram (ECG).  You will lie down on an exam table.  Pictures of your heart will be taken for about 15-20 minutes.  You may also have a stress test. For this test, one of these things may be  done: ? You will be asked to exercise on a treadmill or a stationary bike. ? You will be given medicines that will make your heart work harder. This is done if you are unable to exercise.  When blood flow to your heart has peaked, a tracer will again be given through the IV tube.  After 20-40 minutes, you will get back on the exam table. More pictures will be taken of your heart.  Depending on the tracer that is used, more pictures may need to be taken 3-4 hours later.  Your IV tube will be removed when the test is over. The test may vary among doctors and hospitals. What happens after the test?  Ask your doctor: ? Whether you can return to your normal schedule, including diet, activities, and medicines. ? Whether you should drink more fluids. This will help to remove the tracer from your body. Drink enough fluid to keep your pee (urine) pale yellow.  Ask your doctor, or the department that is doing the test: ? When will my results be ready? ? How will I get my results? Summary  A cardiac nuclear scan is a test that is done to check the flow of blood  to your heart.  Tell your doctor whether you are pregnant or may be pregnant.  Before the test, ask your doctor about changing or stopping your normal medicines. This is important.  Ask your doctor whether you can return to your normal activities. You may be asked to drink more fluids. This information is not intended to replace advice given to you by your health care provider. Make sure you discuss any questions you have with your health care provider. Document Revised: 11/26/2018 Document Reviewed: 01/20/2018 Elsevier Patient Education  [place].

## 2020-01-20 NOTE — Addendum Note (Signed)
Addended by: Alvis Lemmings C on: 01/20/2020 08:42 AM   Modules accepted: Orders

## 2020-01-22 ENCOUNTER — Telehealth: Payer: Self-pay | Admitting: Family

## 2020-01-22 NOTE — Telephone Encounter (Signed)
Patient wants to know if she can take a xanax prior to Myoview.

## 2020-01-22 NOTE — Telephone Encounter (Signed)
Pt reports she has a myoview coming up and she is wondering about if she could take xanax prior to imaging.   She has started zoloft and thinks it may be in her system enough by then that she will not need it but wanted to know just in case.   I told patient if she will not be able to get through testing without it, I don't see any reason that she cannot take a half dose.   Pt reports she will carry it with her just in case she needs it.   Advised pt to call for any further questions or concerns.

## 2020-01-26 ENCOUNTER — Other Ambulatory Visit: Payer: Medicare Other

## 2020-02-02 ENCOUNTER — Other Ambulatory Visit: Payer: Self-pay

## 2020-02-02 ENCOUNTER — Encounter
Admission: RE | Admit: 2020-02-02 | Discharge: 2020-02-02 | Disposition: A | Discharge status: Home / Self Care | Payer: Medicare Other | Source: Ambulatory Visit | Attending: Family | Admitting: Family

## 2020-02-02 DIAGNOSIS — I2510 Atherosclerotic heart disease of native coronary artery without angina pectoris: Secondary | ICD-10-CM | POA: Diagnosis present

## 2020-02-02 DIAGNOSIS — Z8249 Family history of ischemic heart disease and other diseases of the circulatory system: Secondary | ICD-10-CM | POA: Insufficient documentation

## 2020-02-02 LAB — NM MYOCAR MULTI W/SPECT W/WALL MOTION / EF
Estimated workload: 9.1 METS
Exercise duration (min): 7 min
Exercise duration (sec): 23 s
LV dias vol: 41 mL (ref 46–106)
LV sys vol: 14 mL
MPHR: 147 {beats}/min
Peak HR: 153 {beats}/min
Percent HR: 104 %
Rest HR: 75 {beats}/min
TID: 0.95

## 2020-02-02 MED ORDER — TECHNETIUM TC 99M TETROFOSMIN IV KIT
30.0000 | PACK | Freq: Once | INTRAVENOUS | Status: AC | PRN
  Administered 2020-02-02: 32.238 via INTRAVENOUS

## 2020-02-02 MED ORDER — TECHNETIUM TC 99M TETROFOSMIN IV KIT
10.0000 | PACK | Freq: Once | INTRAVENOUS | Status: AC | PRN
  Administered 2020-02-02: 11.059 via INTRAVENOUS

## 2020-02-02 MED ORDER — REGADENOSON 0.4 MG/5ML IV SOLN
0.4000 mg | Freq: Once | INTRAVENOUS | Status: AC
  Administered 2020-02-02: 0.4 mg via INTRAVENOUS
  Filled 2020-02-02: qty 5

## 2020-02-03 ENCOUNTER — Encounter: Payer: Self-pay | Admitting: Family

## 2020-02-03 ENCOUNTER — Telehealth: Payer: Self-pay | Admitting: Family

## 2020-02-03 NOTE — Telephone Encounter (Signed)
Reviewed stress test result via phone. Normal LVEF, no evidence of ischemia. Moderate sized defect involving inferoseptal wall consistent with scar unable to exclude artifact - echo 10/2019 with no regional wall motion abnormalities, EKG with no ST/T wave abnormalities. No indication for further ischemic evaluation.  Reports no chest pain, pressure, tightness. Continues to walk daily without difficulty.   Discussed cholesterol goal of LDL <70. She will continue Zetia and have repeat labs 03/01/20.   Loel Dubonnet, NP

## 2020-02-15 ENCOUNTER — Encounter: Payer: Self-pay | Admitting: Family

## 2020-03-02 ENCOUNTER — Other Ambulatory Visit (INDEPENDENT_AMBULATORY_CARE_PROVIDER_SITE_OTHER): Payer: Medicare Other

## 2020-03-02 ENCOUNTER — Other Ambulatory Visit: Payer: Self-pay

## 2020-03-02 DIAGNOSIS — R7303 Prediabetes: Secondary | ICD-10-CM

## 2020-03-02 DIAGNOSIS — E785 Hyperlipidemia, unspecified: Secondary | ICD-10-CM | POA: Diagnosis not present

## 2020-03-02 DIAGNOSIS — M810 Age-related osteoporosis without current pathological fracture: Secondary | ICD-10-CM

## 2020-03-02 LAB — HEMOGLOBIN A1C: Hgb A1c MFr Bld: 5.8 % (ref 4.6–6.5)

## 2020-03-02 LAB — LIPID PANEL
Cholesterol: 285 mg/dL — ABNORMAL HIGH (ref 0–200)
HDL: 82.6 mg/dL (ref >39.00)
LDL Cholesterol: 183 mg/dL — ABNORMAL HIGH (ref 0–99)
NonHDL: 202.85
Total CHOL/HDL Ratio: 3
Triglycerides: 100 mg/dL (ref 0.0–149.0)
VLDL: 20 mg/dL (ref 0.0–40.0)

## 2020-03-02 LAB — VITAMIN D 25 HYDROXY (VIT D DEFICIENCY, FRACTURES): VITD: 39.99 ng/mL (ref 30.00–100.00)

## 2020-03-08 ENCOUNTER — Encounter: Payer: Self-pay | Admitting: Family

## 2020-03-08 ENCOUNTER — Other Ambulatory Visit: Payer: Self-pay

## 2020-03-08 ENCOUNTER — Ambulatory Visit (INDEPENDENT_AMBULATORY_CARE_PROVIDER_SITE_OTHER): Payer: Medicare Other | Admitting: Family

## 2020-03-08 VITALS — BP 140/100 | HR 75 | Ht 60.0 in | Wt 120.0 lb

## 2020-03-08 DIAGNOSIS — I2510 Atherosclerotic heart disease of native coronary artery without angina pectoris: Secondary | ICD-10-CM

## 2020-03-08 DIAGNOSIS — E785 Hyperlipidemia, unspecified: Secondary | ICD-10-CM

## 2020-03-08 NOTE — Progress Notes (Signed)
Office Visit    Patient Name: Cynthia Collins Date of Encounter: 03/08/2020  Primary Care Provider:  Pleas Koch, NP Primary Cardiologist:  Kathlyn Sacramento, MD Electrophysiologist:  None   Chief Complaint    Cynthia Collins is a 74 y.o. female with a hx of severe hyperlipidemia, anxiety, whitecoat syndrome, family history notable for CAD presents today for follow-up after lexican myoview.  Past Medical History    Past Medical History:  Diagnosis Date  . Allergy   . Heart murmur   . Hyperlipidemia   . Prediabetes    Past Surgical History:  Procedure Laterality Date  . APPENDECTOMY  08/20/1968  . CESAREAN SECTION  08/20/1984  . OOPHORECTOMY Right 08/20/1968    Allergies  Allergies  Allergen Reactions  . Zetia [Ezetimibe]     Nausea/stomach pain/gagging  . Zoloft [Sertraline] Anxiety    History of Present Illness    [name] is a 74 y.o. female with a hx of  severe hyperlipidemia, anxiety, whitecoat syndrome, heart murmur with normal echo 10/2019, family history notable for CAD.  She was last seen 01/19/20.  Family history notable for CAD with father having passed of MI.  Her mother had carotid disease and died of heart failure.  Echocardiogram 10/2019 in setting of faint heart murmur showed no significant valvular normalities.  She was referred to Dr. Fletcher Anon for evaluation of cardiovascular disease given severe hyperlipidemia.  Her LDL is greater than 200.  She has chronic aches and pain and has refused statin. Recommended for coronary calcium score - showed coronary calcium score 1118 placing her in 96th percentile for age/sex. Recommended for treatment of HLD.   She is active and walks 4 miles per day without exertional dyspnea or chest pain.  ANA score 1280 a number of years ago. Saw rheumatologist and all testing was negative. Tells me her muscle system that hurts presently is her left arm and between her shoulder blades that has been ongoing for 6  months. Previously managed with therapist, massage therapist, and acupuncturist.  Has not reestablished with these provider since moving to the area.  She was seen 01/19/2020. She noted BP at home routinely 113/67. She reported some anxiety about her heart after the cardiac CT. She works out daily with 10 lb weights. She was started on aspirin 81 mg daily. She was also started on Zetia but had some stomach discomfort and subsequently stopped. She was recommended for Lexiscan Myoview to rule out ischemia. Lexiscan Myoview showed normal LVEF, no evidence of ischemia, noted moderate size defect involving inferoseptal wall consistent with scar unable to include artifact however echo in March was without regional wall motion abnormalities and as such further ischemic evaluation was deferred.  Lipid panel 10/2019 with total cholesterol 339, HDL 91, LDL 228, triglycerides 97. 03/02/2020 total cholesterol 285, HDL 82, LDL 183, triglycerides 100.  We reviewed her recent Lexiscan Myoview, calcium score, lipid panel.  She does note previous intolerance to red yeast rice and is not interested in trying statin understandably.  She has researched utilizing herbal supplements Berbine and Citrus bergamot.  She tells me that with the Zetia after 45 days of taking it she had nausea" violent gagging ".  Nurses are typically more sensitive to medications.  She is unsure whether this was a true reaction or psychosomatic reaction but is understandably unwilling to resume.  Continues to exercise by walking at least 2 to 4 miles per day without difficulty.  No chest pain, pressure, tightness.  No  dyspnea on exertion nor shortness of breath at rest.  She endorses eating a heart healthy diet.  Blood pressure log at home shows blood pressures are routinely 1 teens over 60s.  She has a longstanding history of whitecoat hypertension.  EKGs/Labs/Other Studies Reviewed:   The following studies were reviewed today:  Echo 11/18/19 1. Left  ventricular ejection fraction, by estimation, is 55 to 60%. The  left ventricle has normal function. The left ventricle has no regional  wall motion abnormalities. Left ventricular diastolic parameters are  consistent with Grade I diastolic  dysfunction (impaired relaxation).   2. Right ventricular systolic function is normal. The right ventricular  size is normal. There is normal pulmonary artery systolic pressure.   3. The mitral valve is normal in structure. No evidence of mitral valve  regurgitation. No evidence of mitral stenosis.   4. The aortic valve is tricuspid. Aortic valve regurgitation is not  visualized. Mild aortic valve sclerosis is present, with no evidence of  aortic valve stenosis.   5. The inferior vena cava is normal in size with greater than 50%  respiratory variability, suggesting right atrial pressure of 3 mmHg.   CT calcium score 12/2019 IMPRESSION: 1. Coronary calcium score of 1118 . This was 96th percentile for age and sex matched control.   2. High calcium score. Recommend guideline directed medical therapy and risk factor modification.  EKG:  EKG is ordered today.  The ekg ordered today demonstrates NSR 75 bpm with left axis deviation no acute ST/T wave changes  Recent Labs: 11/10/2019: ALT 19; BUN 14; Creatinine, Ser 0.80; Hemoglobin 14.2; Platelets 248.0; Potassium 4.6; Sodium 139 12/21/2019: TSH 1.28  Recent Lipid Panel    Component Value Date/Time   CHOL 285 (H) 03/02/2020 0847   TRIG 100.0 03/02/2020 0847   HDL 82.60 03/02/2020 0847   CHOLHDL 3 03/02/2020 0847   VLDL 20.0 03/02/2020 0847   LDLCALC 183 (H) 03/02/2020 0847    Home Medications   Current Meds  Medication Sig  . ALPRAZolam (XANAX) 0.5 MG tablet Take 0.5 mg by mouth at bedtime as needed for anxiety.  . Ascorbic Acid (VITAMIN C) 1000 MG tablet Take 1,000 mg by mouth daily.  Marland Kitchen aspirin EC 81 MG tablet Take 1 tablet (81 mg total) by mouth daily.  . Calcium Carbonate-Vitamin D (LIQUID  CALCIUM/VITAMIN D PO) Take by mouth daily.  . Coenzyme Q10 (CO Q 10 PO) Take by mouth. Three times weekly.  Marland Kitchen ELDERBERRY PO Take by mouth daily as needed.  . Hyaluronic Acid-Vitamin C (HYALURONIC ACID PO) Take 100 mg by mouth daily.  . Menaquinone-7 (VITAMIN K2 PO) Take by mouth. 3 DAYS WEEKLY  . Misc Natural Products (LUTEIN 20 PO) Take 20 mg by mouth daily.  . Misc Natural Products (WHITE WILLOW BARK PO) Take by mouth. Patient states she takes this for pain PRN  . Multiple Vitamin (MULTIVITAMIN) tablet Take 1 tablet by mouth daily.  Marland Kitchen NATTOKINASE PO Take by mouth. Qd.  . NON FORMULARY Take 1 drop by mouth daily. Hawthorn Supreme  . NON FORMULARY Bone Up 1 capsule bid.  . NON FORMULARY BCQ 3 days a week.  . NON FORMULARY Collagen Powder Takes 1 scoop daily.  . Omega-3 Fatty Acids (FISH OIL PO) Take by mouth. Three times a week.  Marland Kitchen RESVERATROL PO Take 1 capsule by mouth 3 (three) times a week.  Marland Kitchen VITAMIN D, CHOLECALCIFEROL, PO Take by mouth daily.  . Zinc 50 MG CAPS Take 50 mg  by mouth as needed.     Review of Systems      Review of Systems  Constitutional: Negative for chills, fever and malaise/fatigue.  Cardiovascular: Negative for chest pain, dyspnea on exertion, irregular heartbeat, leg swelling, near-syncope, orthopnea, palpitations and syncope.  Respiratory: Negative for cough, shortness of breath and wheezing.   Gastrointestinal: Negative for melena, nausea and vomiting.  Genitourinary: Negative for hematuria.  Neurological: Negative for dizziness, light-headedness and weakness.  Psychiatric/Behavioral: The patient is nervous/anxious.    All other systems reviewed and are otherwise negative except as noted above.  Physical Exam   VS:  BP (!) 140/100 (BP Location: Left Arm, Patient Position: Sitting, Cuff Size: Normal)   Pulse 75   Ht 5' (1.524 m)   Wt 120 lb (54.4 kg)   SpO2 98%   BMI 23.44 kg/m  , BMI Body mass index is 23.44 kg/m. GEN: Well nourished, well  developed, in no acute distress. HEENT: normal. Neck: Supple, no JVD, carotid bruits, or masses. Cardiac: RRR, no murmurs, rubs, or gallops. No clubbing, cyanosis, edema.  Radials/DP/PT 2+ and equal bilaterally.  Respiratory:  Respirations regular and unlabored, clear to auscultation bilaterally. GI: Soft, nontender, nondistended, BS + x 4. MS: No deformity or atrophy. Skin: Warm and dry, no rash. Neuro:  Strength and sensation are intact. Psych: Normal affect.  Assessment & Plan    1. Coronary calcium on CT - Coronary calcium score 1118 placing her 96th percentile for risk. Hx intolerance to statin.  Lexiscan Myoview 02/02/2020 with moderate in size, severe, fixed defect involving inferior wall consistent with scar but cannot rule out artifact, EF greater than 65%, abnormal probably low risk exercise myocardial perfusion stress test.  As she is without anginal symptoms no indication for further ischemic evaluation at this time.  Recommend she continue aspirin.  Intolerant to statin.  She was educated on signs of symptoms of angina to report.  2. Familial hyperlipidemia/HLD, LDL goal less than 70-Lipid panel 03/02/20 with total cholesterol 285, HDL 82.6, LDL 183, triglycerides 100. Hx of intolerance to statin and hx extremely elevated ANA.  Intolerant to Zetia with nausea and "violent gagging".  She prefers natural method and has to supplement over-the-counter she is trying to lower her cholesterol.  Politely declines injectable PCSK9.  Recommend lipid-lowering diet.  Disposition: Follow up in 1 year(s) with Dr. Fletcher Anon or APP.   Loel Dubonnet, NP 03/08/2020, 2:08 PM

## 2020-03-08 NOTE — Patient Instructions (Addendum)
Medication Instructions:  No medication changes today.   *If you need a refill on your cardiac medications before your next appointment, please call your pharmacy*  Lab Work: Your recent cholesterol lab work was improved compared to previous.   Testing/Procedures: Your EKG today showed normal sinus rhythm.   Follow-Up: At [facility], you and your health needs are our priority.  As part of our continuing mission to provide you with exceptional heart care, we have created designated Provider Care Teams.  These Care Teams include your primary Cardiologist (physician) and Advanced Practice Providers (APPs -  Physician Assistants and Nurse Practitioners) who all work together to provide you with the care you need, when you need it.  We recommend signing up for the patient portal called "MyChart".  Sign up information is provided on this After Visit Summary.  MyChart is used to connect with patients for Virtual Visits (Telemedicine).  Patients are able to view lab/test results, encounter notes, upcoming appointments, etc.  Non-urgent messages can be sent to your provider as well.   To learn more about what you can do with MyChart, go to [email or web address].    Your next appointment:   1 year(s)  The format for your next appointment:   In Person  Provider:   You may see Kathlyn Sacramento, MD or one of the following Advanced Practice Providers on your designated Care Team:    Murray Hodgkins, NP  Christell Faith, PA-C  Marrianne Mood, PA-C  Laurann Montana, NP

## 2020-05-19 ENCOUNTER — Ambulatory Visit (INDEPENDENT_AMBULATORY_CARE_PROVIDER_SITE_OTHER): Payer: Medicare Other

## 2020-05-19 ENCOUNTER — Other Ambulatory Visit: Payer: Self-pay

## 2020-05-19 DIAGNOSIS — Z23 Encounter for immunization: Secondary | ICD-10-CM | POA: Diagnosis not present

## 2020-05-19 NOTE — Progress Notes (Signed)
Per orders of Alma Friendly, injection of tdap given by Kem Parkinson cma. Patient tolerated injection well.

## 2020-06-02 ENCOUNTER — Ambulatory Visit (INDEPENDENT_AMBULATORY_CARE_PROVIDER_SITE_OTHER): Payer: Medicare Other

## 2020-06-02 ENCOUNTER — Other Ambulatory Visit: Payer: Self-pay

## 2020-06-02 DIAGNOSIS — Z23 Encounter for immunization: Secondary | ICD-10-CM

## 2020-07-13 IMAGING — US US BREAST*L* LIMITED INC AXILLA
1 series · 4 of 4 positions shown · non-contrast
Comparison: 11/25/2019

CLINICAL DATA: Patient has throbbing and achy pain in the UPPER
portion of the LEFT breast, radiating from the axilla. Patient had
similar symptoms prior to Xovid-A1 vaccine in the LEFT arm,
performed on 11/30/2019. After the vaccine she had increased
symptoms.

EXAM:
DIGITAL DIAGNOSTIC LEFT MAMMOGRAM WITH CAD AND TOMO
ULTRASOUND LEFT BREAST

[Series 1: us breast*left* limited inc axilla · 0.06mm/px · 4 of 4 slices shown]
[im 1/4]
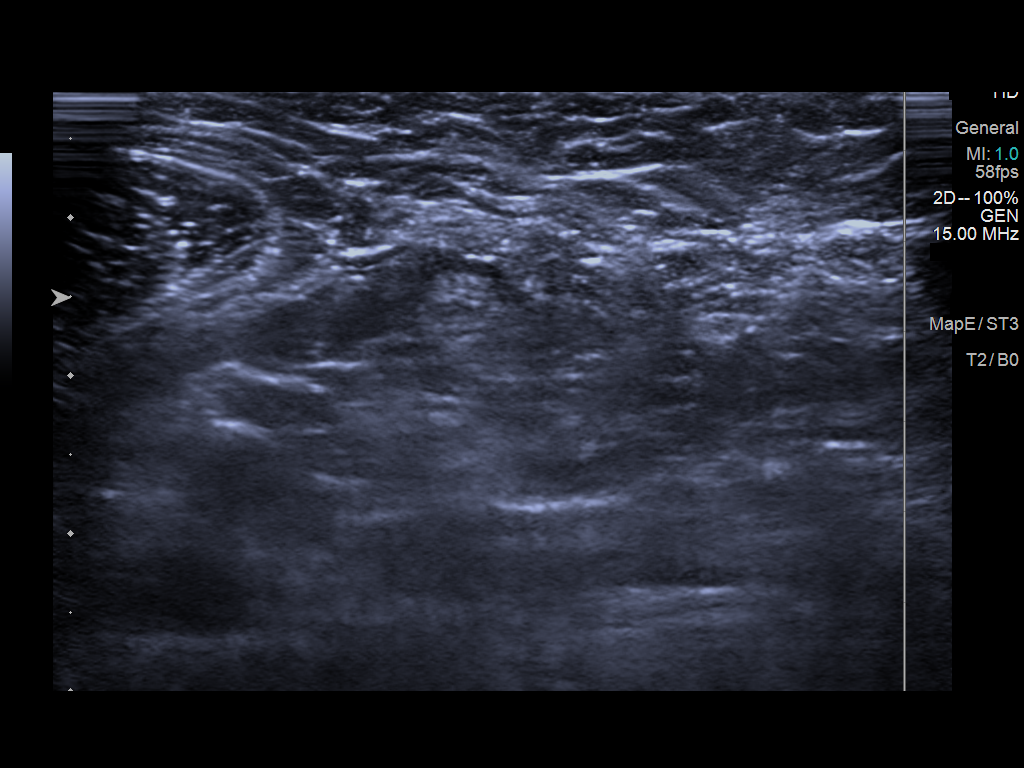
[im 2/4]
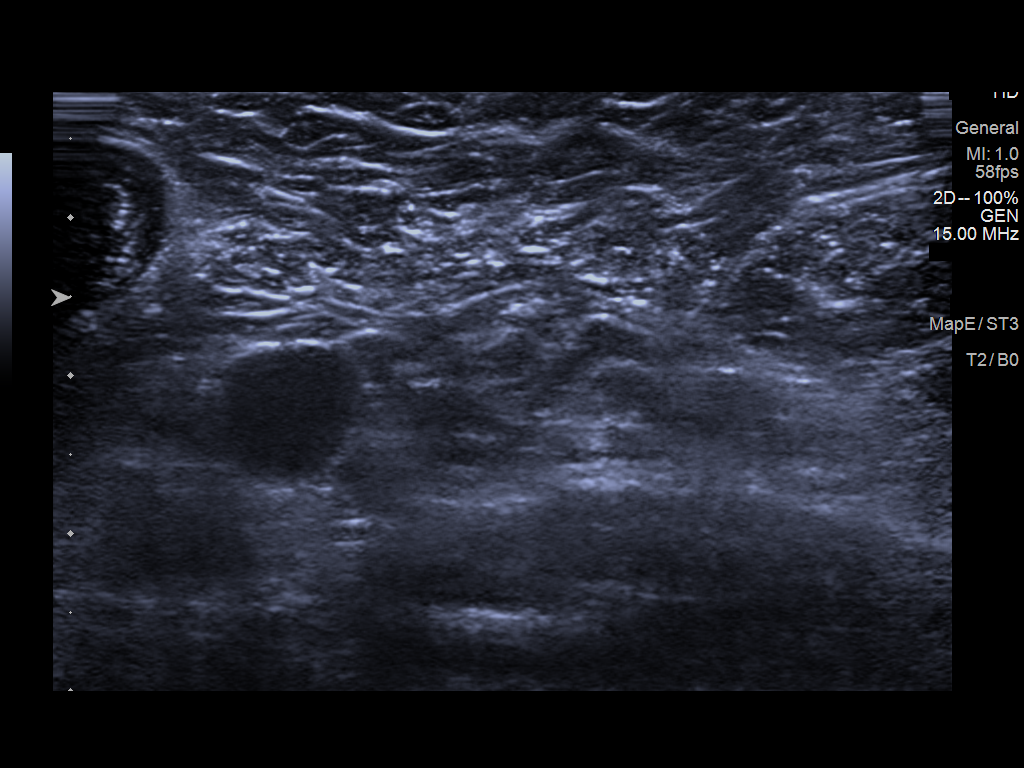
[im 3/4]
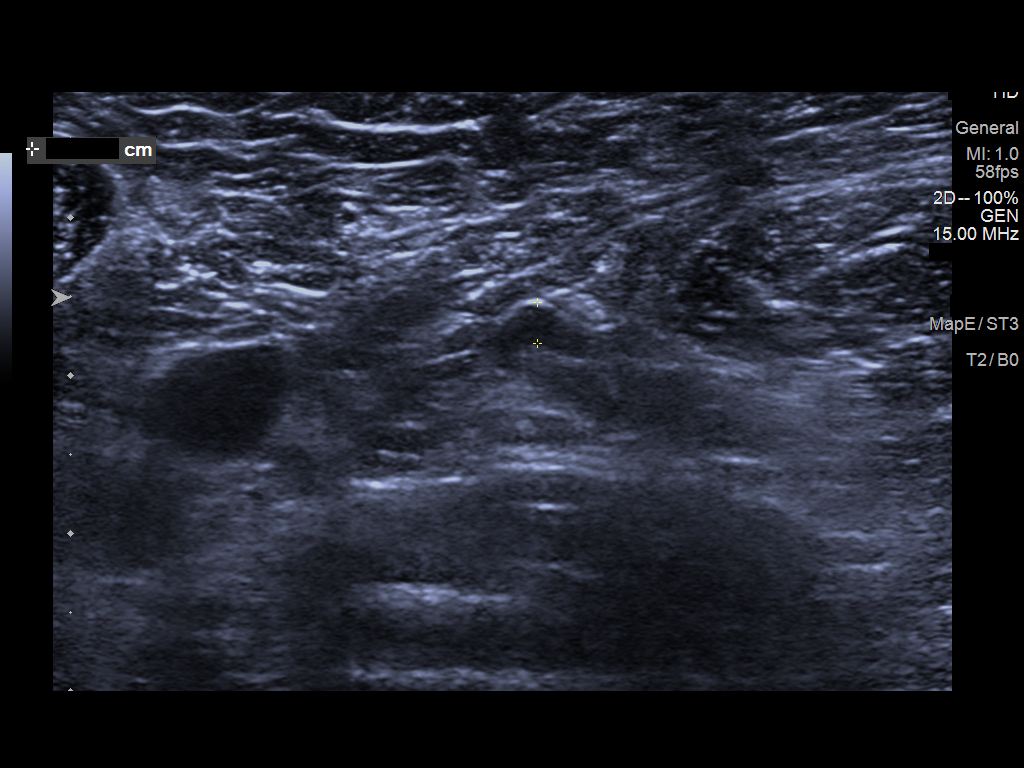
[im 4/4]
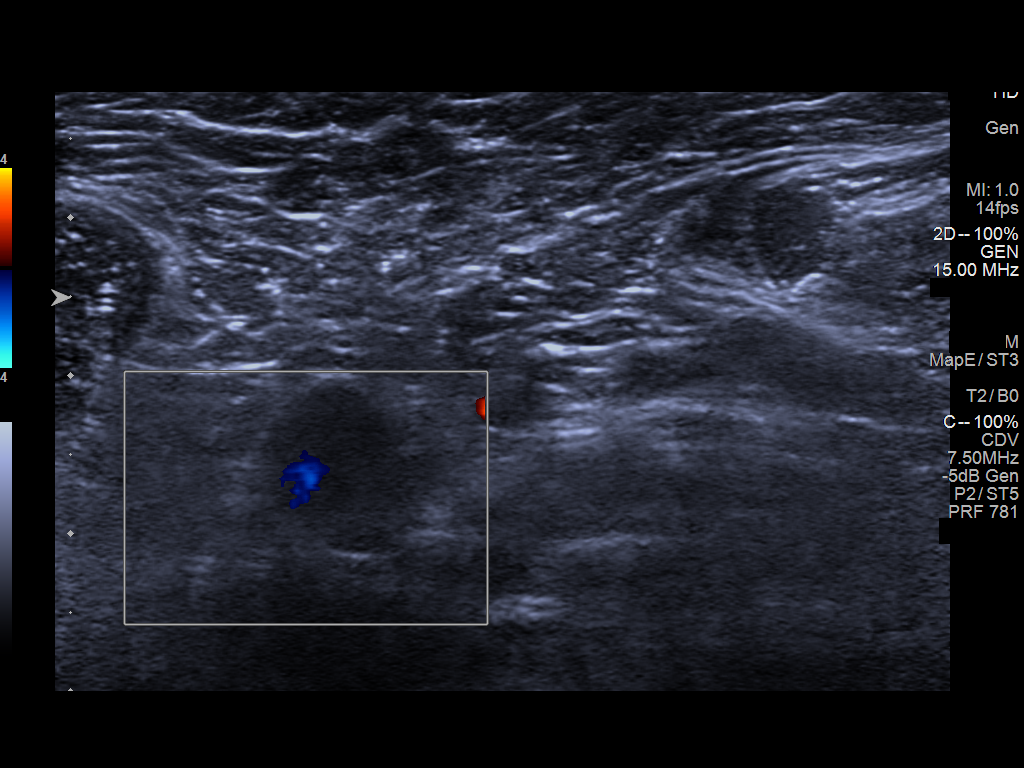

[4 of 4 positions shown; findings below may reference images not displayed]

ACR Breast Density Category c: The breast tissue is heterogeneously
dense, which may obscure small masses.
FINDINGS: No suspicious mass, distortion, or microcalcifications are
identified to suggest presence of malignancy. Specifically, spot
tangential view performed in the central aspect of the LEFT axilla
at the area of patient's concern shows normal appearing fibrofatty
tissue and lymph nodes with normal morphology.

Mammographic images were processed with CAD.

On physical exam, palpate no abnormality in the LEFT axilla.

Targeted ultrasound is performed, showing normal appearing LEFT
axillary lymph nodes. No suspicious mass or adenopathy.
IMPRESSION: No mammographic or ultrasound evidence for malignancy.

RECOMMENDATION:
Screening mammogram is recommended in November 2020.

I have discussed the findings and recommendations with the patient.
If applicable, a reminder letter will be sent to the patient
regarding the next appointment.

BI-RADS CATEGORY  1: Negative.

## 2020-07-13 IMAGING — MG MM DIGITAL DIAGNOSTIC UNILAT*L* W/ TOMO W/ CAD
6 series · 6 of 18 positions shown · non-contrast
Comparison: 11/25/2019

CLINICAL DATA: Patient has throbbing and achy pain in the UPPER
portion of the LEFT breast, radiating from the axilla. Patient had
similar symptoms prior to Xovid-A1 vaccine in the LEFT arm,
performed on 11/30/2019. After the vaccine she had increased
symptoms.

EXAM:
DIGITAL DIAGNOSTIC LEFT MAMMOGRAM WITH CAD AND TOMO
ULTRASOUND LEFT BREAST

[L MLO synth-2D (1 of 3)]
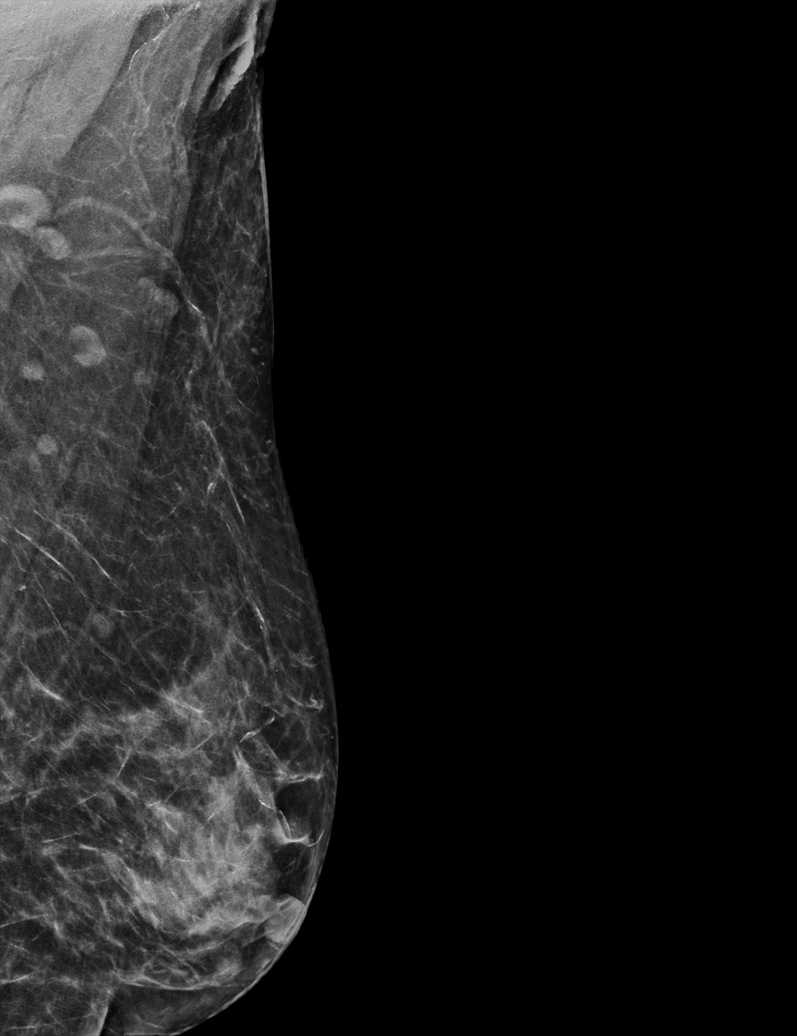

[L MLO synth-2D (2 of 3)]
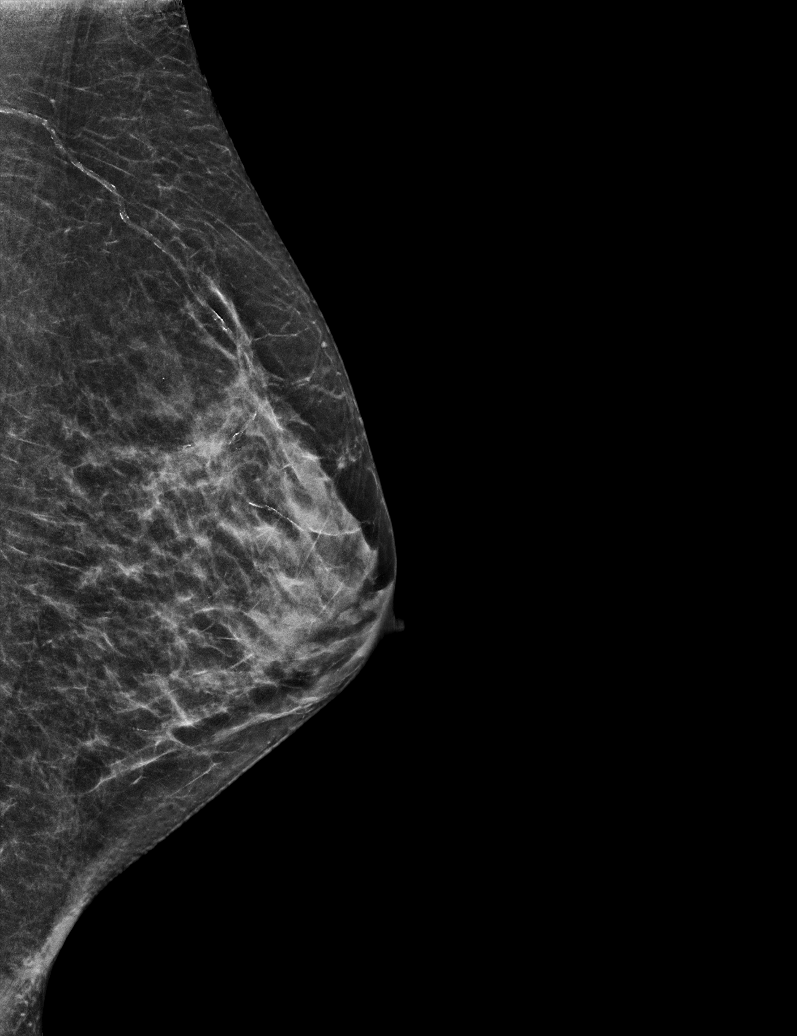

[L MLO synth-2D (3 of 3)]
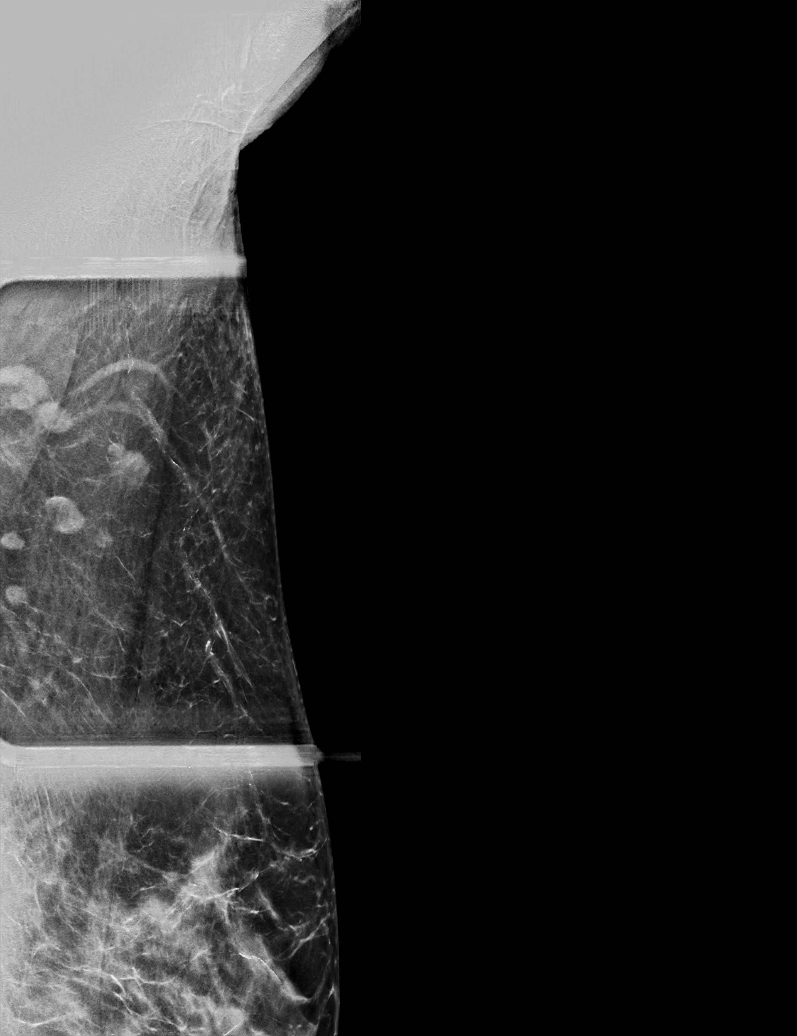

[L MLO tomo (1 of 3) · tomo slice 31/62.0]
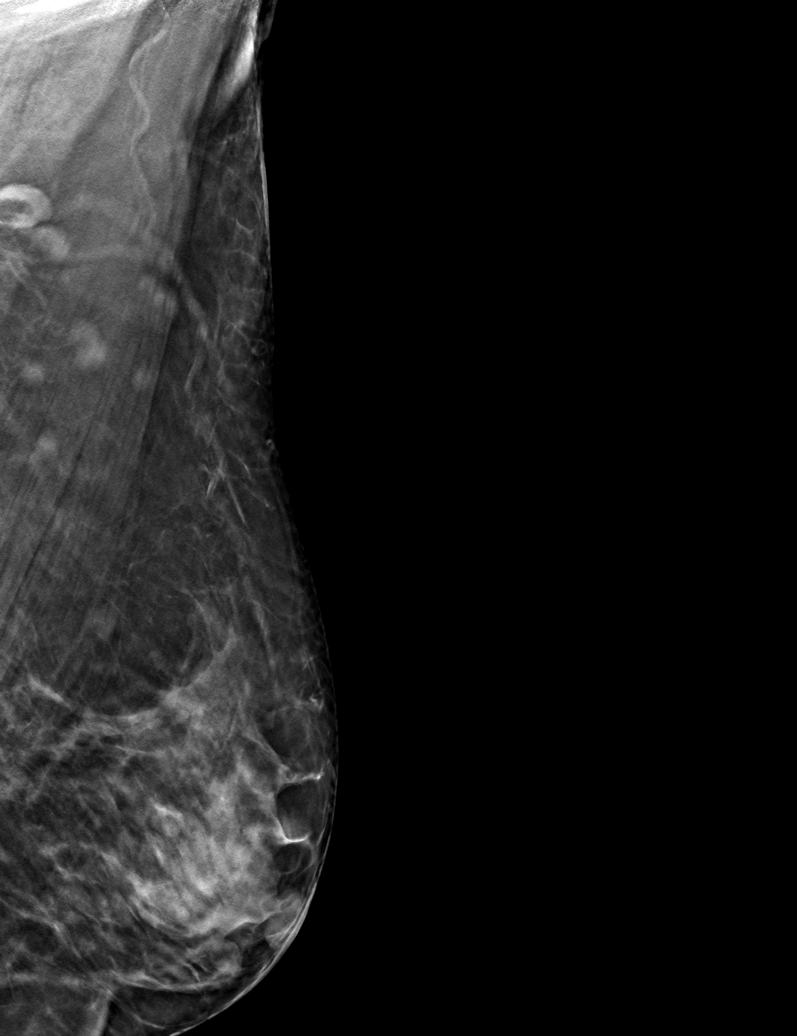

[L MLO tomo (2 of 3) · tomo slice 25/48.0]
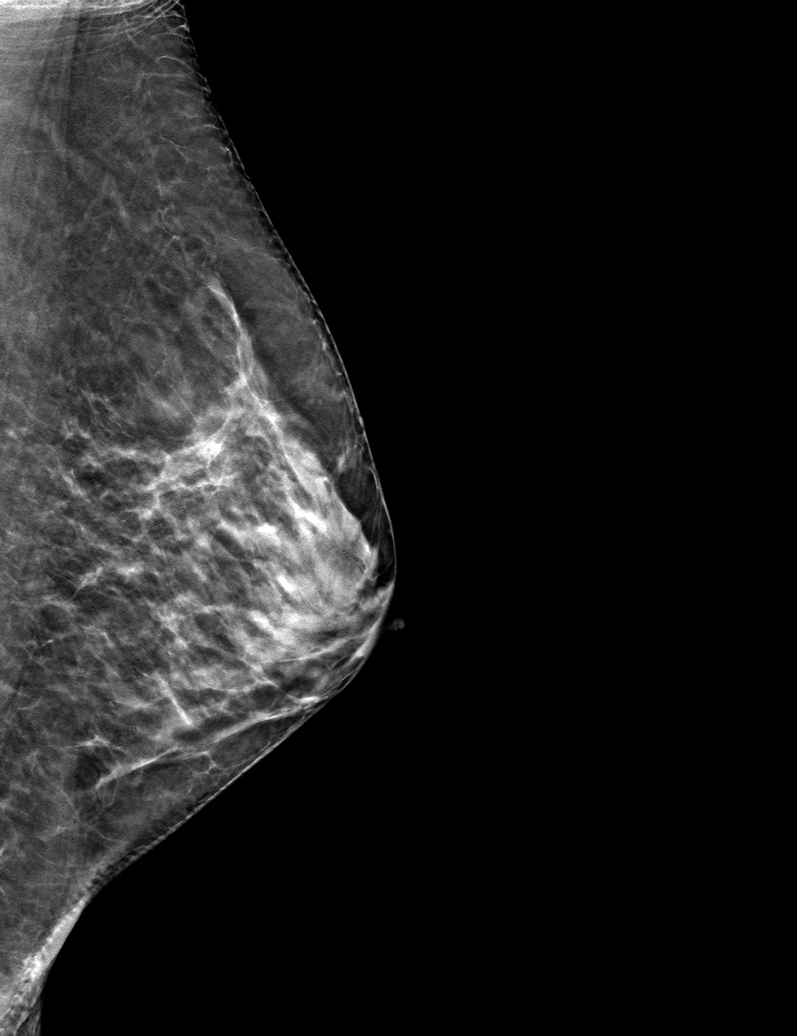

[L MLO tomo (3 of 3) · tomo slice 33/65.0]
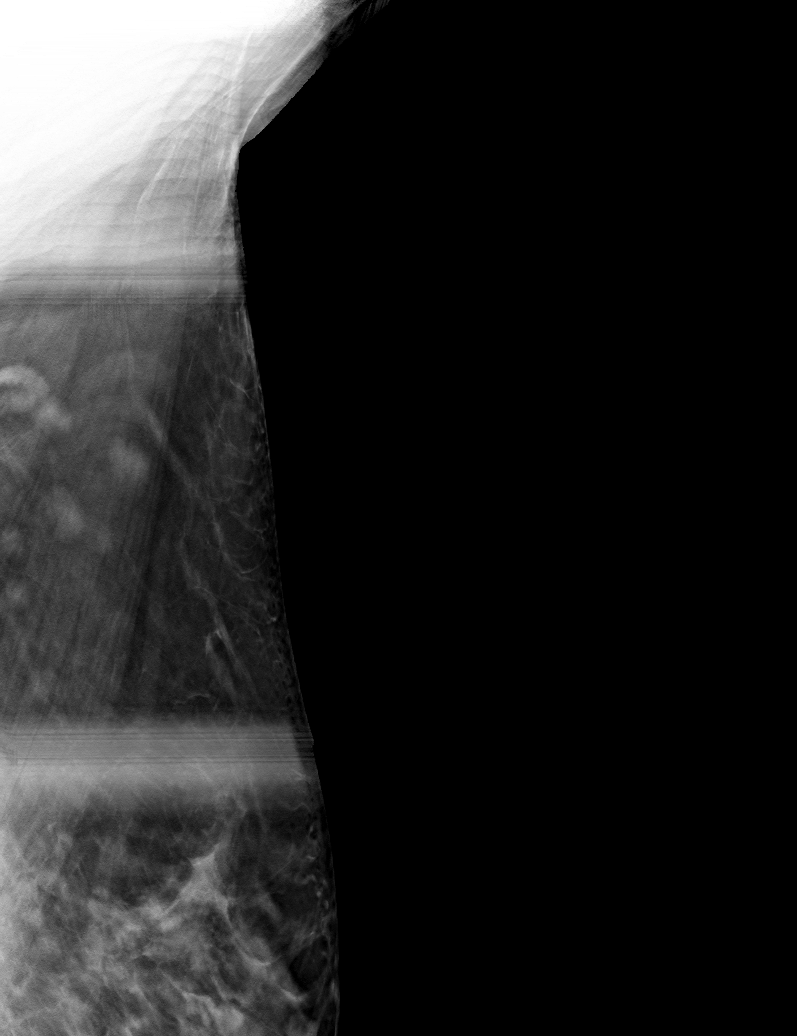

[6 of 18 positions shown; findings below may reference images not displayed]

ACR Breast Density Category c: The breast tissue is heterogeneously
dense, which may obscure small masses.
FINDINGS: No suspicious mass, distortion, or microcalcifications are
identified to suggest presence of malignancy. Specifically, spot
tangential view performed in the central aspect of the LEFT axilla
at the area of patient's concern shows normal appearing fibrofatty
tissue and lymph nodes with normal morphology.

Mammographic images were processed with CAD.

On physical exam, palpate no abnormality in the LEFT axilla.

Targeted ultrasound is performed, showing normal appearing LEFT
axillary lymph nodes. No suspicious mass or adenopathy.
IMPRESSION: No mammographic or ultrasound evidence for malignancy.

RECOMMENDATION:
Screening mammogram is recommended in November 2020.

I have discussed the findings and recommendations with the patient.
If applicable, a reminder letter will be sent to the patient
regarding the next appointment.

BI-RADS CATEGORY  1: Negative.

## 2020-09-26 ENCOUNTER — Encounter: Payer: Self-pay | Admitting: Family

## 2020-09-26 DIAGNOSIS — E785 Hyperlipidemia, unspecified: Secondary | ICD-10-CM

## 2020-09-26 DIAGNOSIS — R7303 Prediabetes: Secondary | ICD-10-CM

## 2020-09-30 ENCOUNTER — Other Ambulatory Visit: Payer: Self-pay

## 2020-09-30 ENCOUNTER — Other Ambulatory Visit (INDEPENDENT_AMBULATORY_CARE_PROVIDER_SITE_OTHER): Payer: Medicare Other

## 2020-09-30 DIAGNOSIS — E785 Hyperlipidemia, unspecified: Secondary | ICD-10-CM | POA: Diagnosis not present

## 2020-09-30 DIAGNOSIS — R7303 Prediabetes: Secondary | ICD-10-CM

## 2020-10-01 LAB — LIPID PANEL
Chol/HDL Ratio: 3.6 ratio (ref 0.0–4.4)
Cholesterol, Total: 281 mg/dL — ABNORMAL HIGH (ref 100–199)
HDL: 78 mg/dL (ref >39)
LDL Chol Calc (NIH): 191 mg/dL — ABNORMAL HIGH (ref 0–99)
Triglycerides: 78 mg/dL (ref 0–149)
VLDL Cholesterol Cal: 12 mg/dL (ref 5–40)

## 2020-10-01 LAB — HEMOGLOBIN A1C
Est. average glucose Bld gHb Est-mCnc: 117 mg/dL
Hgb A1c MFr Bld: 5.7 % — ABNORMAL HIGH (ref 4.8–5.6)

## 2020-10-04 ENCOUNTER — Telehealth: Payer: Self-pay | Admitting: *Deleted

## 2020-10-04 DIAGNOSIS — E785 Hyperlipidemia, unspecified: Secondary | ICD-10-CM

## 2020-10-04 NOTE — Telephone Encounter (Signed)
-----   Message from Loel Dubonnet, NP sent at 10/03/2020 10:00 AM EST ----- A1c of 5.7 which is in the pre-diabetic range (5.7-6.4) though improved compared to previous 5.8. Recommend continued dietary and lifestyle changes.   Lipid panel shows total cholesterol 281 not at goal of <200 and LDL of 191 not at goal of <191. Her LDL has actually increased from previous reading of 183. Given history of intolerance to statin as well as Zetia, would recommend referral to lipid clinic.

## 2020-10-04 NOTE — Telephone Encounter (Signed)
Spoke to pt. Notified of results and provider's recc.  Pt verbalized understanding. Pt will continue dietary and lifestyle changes.  Discussed lipid clinic in detail with pt and she does agree to referral.  Pt only wants to have virtual visit for lipid consult.  Orders placed for referral and noted pt's request for virtual.

## 2020-11-08 ENCOUNTER — Telehealth (INDEPENDENT_AMBULATORY_CARE_PROVIDER_SITE_OTHER): Payer: Medicare Other | Admitting: Internal Medicine

## 2020-11-08 ENCOUNTER — Encounter: Payer: Self-pay | Admitting: Internal Medicine

## 2020-11-08 VITALS — BP 117/61 | HR 76 | Wt 115.0 lb

## 2020-11-08 DIAGNOSIS — E7849 Other hyperlipidemia: Secondary | ICD-10-CM

## 2020-11-08 DIAGNOSIS — I2510 Atherosclerotic heart disease of native coronary artery without angina pectoris: Secondary | ICD-10-CM | POA: Diagnosis not present

## 2020-11-08 DIAGNOSIS — Z8249 Family history of ischemic heart disease and other diseases of the circulatory system: Secondary | ICD-10-CM

## 2020-11-08 NOTE — Progress Notes (Signed)
Virtual Visit via Video Note   This visit type was conducted due to national recommendations for restrictions regarding the COVID-19 Pandemic (e.g. social distancing) in an effort to limit this patient's exposure and mitigate transmission in our community.  Due to her co-morbid illnesses, this patient is at least at moderate risk for complications without adequate follow up.  This format is felt to be most appropriate for this patient at this time.  All issues noted in this document were discussed and addressed.  A limited physical exam was performed with this format.  Please refer to the patient's chart for her consent to telehealth for [facility].      Date:  11/08/2020   ID:  Cynthia Collins, DOB [birth year], MRN [id number] The patient was identified using 2 identifiers.  Evaluation Performed:  New Patient Evaluation  Patient Location:  [address] [place] [address]  Provider location:   [address] [place], [place] [address]  PCP:  Pleas Koch, NP  Cardiologist:  Kathlyn Sacramento, MD Electrophysiologist:  None   Chief Complaint:  FH  History of Present Illness:    Cynthia Collins is a 75 y.o. female who presents via audio/video conferencing for a telehealth visit today.  This is a 74 year old female kindly referred by Laurann Montana, NP for evaluation of dyslipidemia and possible familial hyperlipidemia.  She had lipids done a month ago showing total cholesterol 281, triglycerides 78, HDL 78 and LDL 191.  She had calcium scoring done in May 2021, this demonstrated a calcium score 1118 which was 57 percentile for age and sex matched control.  Calcification was noted in all 3 coronary vessels and was considered a high risk score.  He underwent Myoview stress testing in June which suggested a low risk perfusion study with no reversible ischemia however extensive coronary calcification and aortic atherosclerosis was noted on the attenuation correction CT.   Unfortunately she has been statin and ezetimibe intolerant having had significant side effects including nausea and violent gagging as well as side effects to red yeast rice which caused her intolerance for she was unwilling to try statins.  She has a strong family history of heart disease including her father who died of an MI in her mother who had carotid artery disease and died of heart failure.  She is currently trying the step 1 diet, however review of this cardiologist invented dietary supplement shows that at best lipid lowering is not more than just over 8%, which is consistent with other dietary measures to reduce cholesterol.  She will need at least 50% reduction to reach meaningful risk reduction.  The patient does not have symptoms concerning for COVID-19 infection (fever, chills, cough, or new SHORTNESS OF BREATH).    Prior CV studies:   The following studies were reviewed today:  , Lab work  PMHx:  Past Medical History:  Diagnosis Date  . Allergy   . Heart murmur   . Hyperlipidemia   . Prediabetes     Past Surgical History:  Procedure Laterality Date  . APPENDECTOMY  08/20/1968  . CESAREAN SECTION  08/20/1984  . OOPHORECTOMY Right 08/20/1968    FAMHx:  Family History  Problem Relation Age of Onset  . Alcohol abuse Mother   . Arthritis Mother   . Heart disease Mother   . Hypertension Mother   . Mental illness Mother   . Stroke Mother   . Hypothyroidism Mother   . Stroke Father   . Heart disease Father   .  Hearing loss Father   . Alcohol abuse Sister   . Lung cancer Sister   . Graves' disease Sister   . Heart disease Maternal Grandmother   . Diabetes Maternal Grandfather   . Heart disease Paternal Grandmother   . Breast cancer Neg Hx     SOCHx:   reports that she has never smoked. She has never used smokeless tobacco. She reports current alcohol use of about 1.0 standard drink of alcohol per week. She reports that she does not use drugs.  ALLERGIES:   Allergies  Allergen Reactions  . Red Yeast Rice Extract     Muscle pain  . Zetia [Ezetimibe]     Nausea/stomach pain/gagging  . Zoloft [Sertraline] Anxiety    MEDS:  Current Meds  Medication Sig  . ALPRAZolam (XANAX) 0.5 MG tablet Take 0.5 mg by mouth at bedtime as needed for anxiety.  . Ascorbic Acid (VITAMIN C) 1000 MG tablet Take 1,000 mg by mouth daily.  . Calcium Carbonate-Vitamin D (LIQUID CALCIUM/VITAMIN D PO) Take by mouth daily.  . Coenzyme Q10 (CO Q 10 PO) Take by mouth. Three times weekly.  Marland Kitchen ELDERBERRY PO Take by mouth daily as needed.  . Hyaluronic Acid-Vitamin C (HYALURONIC ACID PO) Take 100 mg by mouth daily.  . Menaquinone-7 (VITAMIN K2 PO) Take by mouth. 3 DAYS WEEKLY  . Misc Natural Products (LUTEIN 20 PO) Take 20 mg by mouth daily.  . Misc Natural Products (WHITE WILLOW BARK PO) Take by mouth. Patient states she takes this for pain PRN  . Multiple Vitamin (MULTIVITAMIN) tablet Take 1 tablet by mouth daily.  Marland Kitchen NATTOKINASE PO Take by mouth. Qd.  . NON FORMULARY Take 1 drop by mouth daily. Haw[employer]orn Supreme  . NON FORMULARY Bone Up 1 capsule bid.  . NON FORMULARY BCQ 3 days a week.  . NON FORMULARY Collagen Powder Takes 1 scoop daily.  . NON FORMULARY [employer]  . NON FORMULARY 500 mg daily. berberine  . Omega-3 Fatty Acids (FISH OIL PO) Take by mouth. Three times a week.  Marland Kitchen RESVERATROL PO Take 1 capsule by mouth 3 (three) times a week.  Marland Kitchen VITAMIN D, CHOLECALCIFEROL, PO Take by mouth daily.  . Zinc 50 MG CAPS Take 50 mg by mouth as needed.     ROS: Pertinent items noted in HPI and remainder of comprehensive ROS otherwise negative.  Labs/Other Tests and Data Reviewed:    Recent Labs: 11/10/2019: ALT 19; BUN 14; Creatinine, Ser 0.80; Hemoglobin 14.2; Platelets 248.0; Potassium 4.6; Sodium 139 12/21/2019: TSH 1.28   Recent Lipid Panel Lab Results  Component Value Date/Time   CHOL 281 (H) 09/30/2020 08:35 AM   TRIG 78 09/30/2020 08:35 AM   HDL 78  09/30/2020 08:35 AM   CHOLHDL 3.6 09/30/2020 08:35 AM   CHOLHDL 3 03/02/2020 08:47 AM   LDLCALC 191 (H) 09/30/2020 08:35 AM    Wt Readings from Last 3 Encounters:  11/08/20 115 lb (52.2 kg)  03/08/20 120 lb (54.4 kg)  01/19/20 119 lb 8 oz (54.2 kg)     Exam:    Vital Signs:  BP 117/61   Pulse 76   Wt 115 lb (52.2 kg)   BMI 22.46 kg/m    General appearance: alert and no distress Lungs: No visual respiratory difficulty Abdomen: Normal weight Extremities: extremities normal, atraumatic, no cyanosis or edema Skin: Skin color, texture, turgor normal. No rashes or lesions Neurologic: Grossly normal Psych: Pleasant  ASSESSMENT & PLAN:    1.  Probable familial hyperlipidemia, LDL greater than 190 2. Severe multivessel coronary calcification, CAC score of 1118 (96 percentile) 3. Family history of heart disease in both parents  Ms. Staples has severe multivessel coronary artery calcification and probable familial hyperlipidemia.  She has not been interested in medications for most of her life.  She tried ezetimibe but then had severe side effects with it.  She had previously tried red yeast rice and had side effects with that and was not interested in taking high potency statins.  Options I think are very limited for her.  She is trying dietary approaches but will not get a sufficient reduction in cholesterol that approach in my opinion.  Her best bet would be a PCSK9 inhibitor which I think would be well-tolerated and she should qualify for.  She was very hesitant, understandably about this and would like to research it more.  She would also like to understand her particle make-up and whether or not she has an elevated LP(a).  I think this is very reasonable and will order repeat testing in 3 months to address these issues.  This should give her more time to see if her cholesterol comes down on her new dietary approach that she is pursuing.  Plan follow-up with me at that time.  Thanks  for the kind referral.  COVID-19 Education: The signs and symptoms of COVID-19 were discussed with the patient and how to seek care for testing (follow up with PCP or arrange E-visit).  The importance of social distancing was discussed today.  Patient Risk:   After full review of this patients clinical status, I feel that they are at least moderate risk at this time.  Time:   Today, I have spent 25 minutes with the patient with telehealth technology discussing dyslipidemia, familial hyperlipidemia, severe multivessel coronary calcification, PCSK9 inhibitors.     Medication Adjustments/Labs and Tests Ordered: Current medicines are reviewed at length with the patient today.  Concerns regarding medicines are outlined above.   Tests Ordered: Orders Placed This Encounter  Procedures  . Lipoprotein A (LPA)  . NMR, lipoprofile    Medication Changes: No orders of the defined types were placed in this encounter.   Disposition:  in 3 month(s)  Pixie Casino, MD, [facility], [place] Director of the Advanced Lipid Disorders &  Cardiovascular Risk Reduction Clinic Diplomate of the American Board of Clinical Lipidology Attending Cardiologist  Direct Dial: [phone]  Fax: [phone]  Website:  [email or web address]  Pixie Casino, MD  11/08/2020 9:07 AM

## 2020-11-08 NOTE — Patient Instructions (Signed)
Medication Instructions:  Your physician recommends that you continue on your current medications as directed. Please refer to the Current Medication list given to you today.  *If you need a refill on your cardiac medications before your next appointment, please call your pharmacy*   Lab Work: FASTING lab work in 3 months - NMR lipoprofile, LP(a) -- complete about 1 week before your next visit with Dr. Debara Pickett  If you have labs (blood work) drawn today and your tests are completely normal, you will receive your results only by: Marland Kitchen MyChart Message (if you have MyChart) OR . A paper copy in the mail If you have any lab test that is abnormal or we need to change your treatment, we will call you to review the results.   Testing/Procedures: NONE   Follow-Up: At [facility], you and your health needs are our priority.  As part of our continuing mission to provide you with exceptional heart care, we have created designated Provider Care Teams.  These Care Teams include your primary Cardiologist (physician) and Advanced Practice Providers (APPs -  Physician Assistants and Nurse Practitioners) who all work together to provide you with the care you need, when you need it.  We recommend signing up for the patient portal called "MyChart".  Sign up information is provided on this After Visit Summary.  MyChart is used to connect with patients for Virtual Visits (Telemedicine).  Patients are able to view lab/test results, encounter notes, upcoming appointments, etc.  Non-urgent messages can be sent to your provider as well.   To learn more about what you can do with MyChart, go to [email or web address].    Your next appointment:   3 month(s)  The format for your next appointment:   Virtual Visit   Provider:   Kathy Breach, MD -- lipid clinic   Other Instructions

## 2021-01-20 ENCOUNTER — Encounter: Payer: Medicare Other | Admitting: Primary Care

## 2021-01-24 ENCOUNTER — Other Ambulatory Visit: Payer: Self-pay

## 2021-01-24 ENCOUNTER — Ambulatory Visit (INDEPENDENT_AMBULATORY_CARE_PROVIDER_SITE_OTHER): Payer: Medicare Other | Admitting: Physician Assistant

## 2021-01-24 ENCOUNTER — Ambulatory Visit (INDEPENDENT_AMBULATORY_CARE_PROVIDER_SITE_OTHER): Payer: Medicare Other

## 2021-01-24 ENCOUNTER — Encounter: Payer: Self-pay | Admitting: Physician Assistant

## 2021-01-24 VITALS — BP 136/92 | HR 72 | Ht 60.0 in | Wt 117.0 lb

## 2021-01-24 DIAGNOSIS — F410 Panic disorder [episodic paroxysmal anxiety] without agoraphobia: Secondary | ICD-10-CM

## 2021-01-24 DIAGNOSIS — E785 Hyperlipidemia, unspecified: Secondary | ICD-10-CM | POA: Diagnosis not present

## 2021-01-24 DIAGNOSIS — I2510 Atherosclerotic heart disease of native coronary artery without angina pectoris: Secondary | ICD-10-CM

## 2021-01-24 DIAGNOSIS — R002 Palpitations: Secondary | ICD-10-CM | POA: Diagnosis not present

## 2021-01-24 DIAGNOSIS — F419 Anxiety disorder, unspecified: Secondary | ICD-10-CM

## 2021-01-24 NOTE — Patient Instructions (Addendum)
Medication Instructions:  - Your physician recommends that you continue on your current medications as directed. Please refer to the Current Medication list given to you today.  *If you need a refill on your cardiac medications before your next appointment, please call your pharmacy*   Lab Work: - Your physician recommends that you have lab work with your Primary Care Provider: CBC/ BMP/ TSH/ Magnesium  If you have labs (blood work) drawn today and your tests are completely normal, you will receive your results only by: Marland Kitchen MyChart Message (if you have MyChart) OR . A paper copy in the mail If you have any lab test that is abnormal or we need to change your treatment, we will call you to review the results.   Testing/Procedures: - Your physician has recommended that you wear a Zio XT (heart) monitor x 14 days- to be placed in office today.   This monitor is a medical device that records the heart's electrical activity. Doctors most often use these monitors to diagnose arrhythmias. Arrhythmias are problems with the speed or rhythm of the heartbeat. The monitor is a small device applied to your chest. You can wear one while you do your normal daily activities. While wearing this monitor if you have any symptoms to push the button and record what you felt. Once you have worn this monitor for the period of time provider prescribed (Usually 14 days), you will return the monitor device in the postage paid box. Once it is returned they will download the data collected and provide [place] with a report which the provider will then review and we will call you with those results. Important tips:  1. Avoid showering during the first 24 hours of wearing the monitor. 2. Avoid excessive sweating to help maximize wear time. 3. Do not submerge the device, no hot tubs, and no swimming pools. 4. Keep any lotions or oils away from the patch. 5. After 24 hours you may shower with the patch on. Take brief showers with  your back facing the shower head.  6. Do not remove patch once it has been placed because that will interrupt data and decrease adhesive wear time. 7. Push the button when you have any symptoms and write down what you were feeling. 8. Once you have completed wearing your monitor, remove and place into box which has postage paid and place in your outgoing mailbox.  9. If for some reason you have misplaced your box then call our office and we can provide another box and/or mail it off for you.   Follow-Up: At [facility], you and your health needs are our priority.  As part of our continuing mission to provide you with exceptional heart care, we have created designated Provider Care Teams.  These Care Teams include your primary Cardiologist (physician) and Advanced Practice Providers (APPs -  Physician Assistants and Nurse Practitioners) who all work together to provide you with the care you need, when you need it.  We recommend signing up for the patient portal called "MyChart".  Sign up information is provided on this After Visit Summary.  MyChart is used to connect with patients for Virtual Visits (Telemedicine).  Patients are able to view lab/test results, encounter notes, upcoming appointments, etc.  Non-urgent messages can be sent to your provider as well.   To learn more about what you can do with MyChart, go to [email or web address].    Your next appointment:   2 month(s)  The format for your next  appointment:   In Person  Provider:   You may see Kathlyn Sacramento, MD or one of the following Advanced Practice Providers on your designated Care Team:    Murray Hodgkins, NP  Christell Faith, PA-C  Marrianne Mood, PA-C  Cadence Ringoes, [place]  Laurann Montana, NP    Other Instructions n/a

## 2021-01-24 NOTE — Progress Notes (Signed)
Cardiology Office Note    Date:  01/24/2021   ID:  Cynthia Collins, DOB 1945/12/12, MRN [id number]  PCP:  Pleas Koch, NP  Cardiologist:  Kathlyn Sacramento, MD  Electrophysiologist:  None   Chief Complaint: Palpitations   History of Present Illness:   Cynthia Collins is a 75 y.o. female with history of severe hyperlipidemia, anxiety with whitecoat syndrome and underlying panic attack disorder, and family history of CAD who presents for evaluation of palpitations.  Prior echo in 10/2019 for evaluation of murmur showed an EF of 55 to 60%, no regional wall motion abnormalities, grade 1 diastolic dysfunction, normal RV systolic function and ventricular cavity size, normal PASP, mild aortic valve sclerosis without evidence of stenosis, and an estimated right atrial pressure of 3 mmHg.  Murmur noted was likely in the setting of mild aortic valve sclerosis.  She was referred to Dr. Fletcher Anon for evaluation of cardiovascular disease given severe hyperlipidemia with an LDL of greater than 200 historically.  She has chronic generalized myalgias and arthralgias with prior refusal of statin therapy.  Prior rheumatology work-up unrevealing.  Coronary artery calcium score in 12/2019 of 1118 which was the 84 percentile for age and sex matched control.  At baseline, she is active walking approximately 4 miles per day without exertional symptoms and works out with [employer].  Lexiscan MPI in 01/2020 showed a moderate in size, severe, fixed defect involving the inferior septal wall most consistent with scar though could not rule out artifact without significant ischemia being identified.  EF greater than 65%.  Attenuation corrected CT images demonstrated coronary artery calcification and aortic atherosclerosis.  Overall, this was an abnormal, probably low risk nuclear stress test.  With regards to her hyperlipidemia, she has not been interested in trying a statin and has documented intolerance to red yeast rice and  Zetia.  She has been evaluated by the lipid clinic with recommendation for PCSK9 inhibitor.  She preferred to research this prior to initiating therapy.  She now indicates she does not want to move forward with Repatha.  She comes in today noting a 2 to 3-week history of intermittent randomly occurring palpitations which will occur approximately 3 times per day and will last for several seconds in duration followed by spontaneous resolution.  With these, typically there have been no associated symptoms including chest pain, shortness of breath, dizziness, presyncope, syncope, or vision changes.  While laying in the bed last evening she had an episode of tachypalpitations that felt like they were radiating superiorly along her sternum.  There was no frank chest pain.  This episode did not last longer therefore she proceeded to go to the ED.  However, upon arrival to the parking lot her symptoms resolved and she went back home.  She indicates that she has been in a "panic attack" since.  There have been no significant lifestyle changes leading up to these events.  She drinks unsweetened green tea during the day.  She does participate in a holistic diet that she receives from an outside physician with timing possibly correlating with onset of palpitations.  She did note the palpitations seem to improve when she stopped this diet though she has subsequently resumed it leading up to her upcoming lipid panel to be drawn later next week.  She is currently without tachypalpitations.  She continues to be very active at baseline exercising on a daily basis and walking 3 miles per day without cardiopulmonary limitation or symptomatic palpitation burden noted.  Labs independently reviewed: 09/2020 - A1c 5.7, TC 281, TG 78, HDL 78, LDL 191 12/2019 - TSH normal 10/2019 - Hgb 14.2, PLT 248, potassium 4.6, BUN 14, serum creatinine 0.8, albumin 4.5, AST/ALT normal  Past Medical History:  Diagnosis Date  . Allergy   .  Heart murmur   . Hyperlipidemia   . Prediabetes     Past Surgical History:  Procedure Laterality Date  . APPENDECTOMY  08/20/1968  . CESAREAN SECTION  08/20/1984  . OOPHORECTOMY Right 08/20/1968    Current Medications: Current Meds  Medication Sig  . ALPRAZolam (XANAX) 0.5 MG tablet Take 0.5 mg by mouth at bedtime as needed for anxiety.  . Ascorbic Acid (VITAMIN C) 1000 MG tablet Take 1,000 mg by mouth daily.  . Coenzyme Q10 (CO Q 10 PO) Take by mouth. Three times weekly.  Marland Kitchen ELDERBERRY PO Take by mouth daily as needed.  Marland Kitchen GARLIC PO Take by mouth daily.  Marland Kitchen Hyaluronic Acid-Vitamin C (HYALURONIC ACID PO) Take 100 mg by mouth daily.  . Menaquinone-7 (VITAMIN K2 PO) Take by mouth. 3 DAYS WEEKLY  . Misc Natural Products (LUTEIN 20 PO) Take 20 mg by mouth daily.  . Misc Natural Products (WHITE WILLOW BARK PO) Take by mouth. Patient states she takes this for pain PRN  . Multiple Vitamin (MULTIVITAMIN) tablet Take 1 tablet by mouth daily.  Marland Kitchen NATTOKINASE PO Take by mouth. Qd.  . NON FORMULARY Take 1 drop by mouth daily. Hawthorn Supreme  . NON FORMULARY Bone Up 1 capsule bid.  . NON FORMULARY BCQ 3 days a week.  . NON FORMULARY Collagen Powder Takes 1 scoop daily.  . NON FORMULARY Step One Foods  . Omega-3 Fatty Acids (FISH OIL PO) Take by mouth. Three times a week.  Marland Kitchen RESVERATROL PO Take 1 capsule by mouth 3 (three) times a week.  Marland Kitchen VITAMIN D, CHOLECALCIFEROL, PO Take by mouth daily.  . Zinc 50 MG CAPS Take 50 mg by mouth as needed.    Allergies:   Red yeast rice extract, Zetia [ezetimibe], and Zoloft [sertraline]   Social History   Socioeconomic History  . Marital status: Divorced    Spouse name: Not on file  . Number of children: Not on file  . Years of education: Not on file  . Highest education level: Not on file  Occupational History  . Not on file  Tobacco Use  . Smoking status: Never Smoker  . Smokeless tobacco: Never Used  Vaping Use  . Vaping Use: Never used   Substance and Sexual Activity  . Alcohol use: Yes    Alcohol/week: 1.0 standard drink    Types: 1 Cans of beer per week    Comment: Once a month   . Drug use: Never  . Sexual activity: Not on file  Other Topics Concern  . Not on file  Social History Narrative  . Not on file   Social Determinants of Health   Financial Resource Strain: Not on file  Food Insecurity: Not on file  Transportation Needs: Not on file  Physical Activity: Not on file  Stress: Not on file  Social Connections: Not on file     Family History:  The patient's family history includes Alcohol abuse in her mother and sister; Arthritis in her mother; Diabetes in her maternal grandfather; Berenice Primas' disease in her sister; Hearing loss in her father; Heart disease in her father, maternal grandmother, mother, and paternal grandmother; Hypertension in her mother; Hypothyroidism in her mother; Lung  cancer in her sister; Mental illness in her mother; Stroke in her father and mother. There is no history of Breast cancer.  ROS:   Review of Systems  Constitutional: Negative for chills, diaphoresis, fever, malaise/fatigue and weight loss.  HENT: Negative for congestion.   Eyes: Negative for discharge and redness.  Respiratory: Negative for cough, sputum production, shortness of breath and wheezing.   Cardiovascular: Positive for palpitations. Negative for chest pain, orthopnea, claudication, leg swelling and PND.  Gastrointestinal: Negative for abdominal pain, heartburn, nausea and vomiting.  Musculoskeletal: Negative for falls and myalgias.  Skin: Negative for rash.  Neurological: Negative for dizziness, tingling, tremors, sensory change, speech change, focal weakness, loss of consciousness and weakness.  Endo/Heme/Allergies: Does not bruise/bleed easily.  Psychiatric/Behavioral: Negative for substance abuse. The patient is nervous/anxious.   All other systems reviewed and are negative.    EKGs/Labs/Other Studies  Reviewed:    Studies reviewed were summarized above. The additional studies were reviewed today:  Lexiscan MPI 01/2020:  Abnormal, probably low risk exercise myocardial perfusion stress test.  There is a moderate in size, severe, fixed defect involving the inferoseptal wall most consistent with scar but cannot rule out artifact.  No significant ischemia was identified.  The left ventricular ejection fraction is normal (>65%).  Attenuation correction CT demonstrates coronary artery calcification and aortic atherosclerosis. __________  Calcium score 12/2019: 1. Coronary calcium score of 1118 . This was 96th percentile for age and sex matched control. 2. High calcium score. Recommend guideline directed medical therapy and risk factor modification. __________  2D echo 10/2019: 1. Left ventricular ejection fraction, by estimation, is 55 to 60%. The  left ventricle has normal function. The left ventricle has no regional  wall motion abnormalities. Left ventricular diastolic parameters are  consistent with Grade I diastolic  dysfunction (impaired relaxation).  2. Right ventricular systolic function is normal. The right ventricular  size is normal. There is normal pulmonary artery systolic pressure.  3. The mitral valve is normal in structure. No evidence of mitral valve  regurgitation. No evidence of mitral stenosis.  4. The aortic valve is tricuspid. Aortic valve regurgitation is not  visualized. Mild aortic valve sclerosis is present, with no evidence of  aortic valve stenosis.  5. The inferior vena cava is normal in size with greater than 50%  respiratory variability, suggesting right atrial pressure of 3 mmHg.   EKG:  EKG is ordered today.  The EKG ordered today demonstrates NSR with sinus arrhythmia, 72 bpm, left axis deviation, no acute ST-T changes, no acute changes when compared to prior tracing  Recent Labs: No results found for requested labs within last 8760 hours.   Recent Lipid Panel    Component Value Date/Time   CHOL 281 (H) 09/30/2020 0835   TRIG 78 09/30/2020 0835   HDL 78 09/30/2020 0835   CHOLHDL 3.6 09/30/2020 0835   CHOLHDL 3 03/02/2020 0847   VLDL 20.0 03/02/2020 0847   LDLCALC 191 (H) 09/30/2020 0835    PHYSICAL EXAM:    VS:  BP (!) 136/92   Pulse 72   Ht 5' (1.524 m)   Wt 117 lb (53.1 kg)   BMI 22.85 kg/m   BMI: Body mass index is 22.85 kg/m.  Physical Exam Vitals reviewed.  Constitutional:      Appearance: She is well-developed.  HENT:     Head: Normocephalic and atraumatic.  Eyes:     General:        Right eye: No discharge.  Left eye: No discharge.  Neck:     Vascular: No JVD.  Cardiovascular:     Rate and Rhythm: Normal rate and regular rhythm.     Pulses: No midsystolic click and no opening snap.          Dorsalis pedis pulses are 2+ on the right side and 2+ on the left side.       Posterior tibial pulses are 2+ on the right side and 2+ on the left side.     Heart sounds: S1 normal and S2 normal. Heart sounds not distant. Murmur heard.   Systolic murmur is present with a grade of 1/6 at the upper right sternal border. No friction rub.  Pulmonary:     Effort: Pulmonary effort is normal. No respiratory distress.     Breath sounds: Normal breath sounds. No decreased breath sounds, wheezing or rales.  Chest:     Chest wall: No tenderness.  Abdominal:     General: There is no distension.     Palpations: Abdomen is soft.     Tenderness: There is no abdominal tenderness.  Musculoskeletal:     Cervical back: Normal range of motion.  Skin:    General: Skin is warm and dry.     Nails: There is no clubbing.  Neurological:     Mental Status: She is alert and oriented to person, place, and time.  Psychiatric:        Speech: Speech normal.        Behavior: Behavior normal.        Thought Content: Thought content normal.        Judgment: Judgment normal.     Wt Readings from Last 3 Encounters:   01/24/21 117 lb (53.1 kg)  11/08/20 115 lb (52.2 kg)  03/08/20 120 lb (54.4 kg)     ASSESSMENT & PLAN:   1. Palpitations: She notes a 2 to 3-week history of palpitations that will be several seconds in duration occurring upwards of 3 times per day.  There may be a possible correlation with symptom onset with her recent holistic diet initiation "step 1 foods."  In this setting we have asked that she defer continuing this holistic diet at this time to assess her symptoms.  We will place a ZIO XT for 2 weeks.  She will be having blood work drawn next week through her PCPs office.  We will place future orders for a CBC, BMP, TSH, and magnesium to be drawn at that time with results routed to me for review.  She should continue to minimize caffeine intake and stay well-hydrated.  Recent echo demonstrated preserved LVSF with recent [place] MPI showing no evidence of ischemia.  2. CAD involving the native coronary arteries without angina with associated severe hyperlipidemia: She has been evaluated by the lipid clinic.  She declines statins and has noted intolerance to Zetia and red yeast rice.  She declines initiation of PCSK9 inhibitor.  She did start a holistic diet as prescribed by an outside physician, though this will be on hold at this time as there may be a timeline correlation associated with onset of palpitations.  Recent Lexiscan MPI showed no evidence of ischemia.  Further management of her hyperlipidemia per lipid clinic.  3. Aortic valve sclerosis: No evidence of stenosis on recent echo.  No further work-up indicated at this time.  4. Anxiety/panic attack: Possibly contributing to her overall presentation.  This was discussed with her in the office today.  Recommend  she discuss treatment options with PCP.   Disposition: F/u with Dr. Fletcher Anon or an APP in 2 months.   Medication Adjustments/Labs and Tests Ordered: Current medicines are reviewed at length with the patient today.  Concerns  regarding medicines are outlined above. Medication changes, Labs and Tests ordered today are summarized above and listed in the Patient Instructions accessible in Encounters.   Signed, Christell Faith, PA-C 01/24/2021 4:53 PM     [address] [place], [place] [address] [phone]

## 2021-01-25 NOTE — Addendum Note (Signed)
Addended by: Ernie Hew D on: 01/25/2021 11:28 AM   Modules accepted: Orders

## 2021-01-27 ENCOUNTER — Other Ambulatory Visit: Payer: Self-pay

## 2021-01-27 ENCOUNTER — Ambulatory Visit (INDEPENDENT_AMBULATORY_CARE_PROVIDER_SITE_OTHER): Payer: Medicare Other | Admitting: Primary Care

## 2021-01-27 VITALS — BP 162/88 | HR 78 | Temp 97.8°F | Ht 60.0 in | Wt 118.2 lb

## 2021-01-27 DIAGNOSIS — D229 Melanocytic nevi, unspecified: Secondary | ICD-10-CM

## 2021-01-27 DIAGNOSIS — R002 Palpitations: Secondary | ICD-10-CM | POA: Diagnosis not present

## 2021-01-27 DIAGNOSIS — R7303 Prediabetes: Secondary | ICD-10-CM

## 2021-01-27 DIAGNOSIS — E7849 Other hyperlipidemia: Secondary | ICD-10-CM | POA: Diagnosis not present

## 2021-01-27 DIAGNOSIS — I2510 Atherosclerotic heart disease of native coronary artery without angina pectoris: Secondary | ICD-10-CM | POA: Diagnosis not present

## 2021-01-27 DIAGNOSIS — E785 Hyperlipidemia, unspecified: Secondary | ICD-10-CM

## 2021-01-27 DIAGNOSIS — F419 Anxiety disorder, unspecified: Secondary | ICD-10-CM

## 2021-01-27 DIAGNOSIS — M810 Age-related osteoporosis without current pathological fracture: Secondary | ICD-10-CM | POA: Diagnosis not present

## 2021-01-27 MED ORDER — ALPRAZOLAM 0.25 MG PO TABS
ORAL_TABLET | ORAL | 0 refills | Status: AC
Start: 2021-01-27 — End: ?

## 2021-01-27 NOTE — Assessment & Plan Note (Signed)
Chronic, continued.  Agree to fill 10 tablets of Xanax 0.25 mg to use PRN that should last one full year. She agrees.

## 2021-01-27 NOTE — Assessment & Plan Note (Signed)
Following with cardiology. Continue Zio monitor. Labs pending.

## 2021-01-27 NOTE — Progress Notes (Signed)
Subjective:    Patient ID: Cynthia Collins, female    DOB: October 20, 1945, 75 y.o.   MRN: [id number]  HPI  Cynthia Collins is a very pleasant 75 y.o. female who presents today for follow up of chronic conditions. She does not want a mammogram this year.   ) CAD/Hyperlipidemia/Murmur: Following with cardiology for palpitations and severe hyperlipidemia, last visit was last week for increased palpitations. She was asked to stop her new holistic diet and to wear a ZIO monitor for 2 weeks. Needs labs drawn for TSH, BMP, CBC, magnesium. Intolerant to statins and Zetia, she has declined PCSK9 inhibitor.   Last echo was in 2021 with LVEF of 55-60%, grade 1 DD, normal PASP, mild aortic valve stenosis. Coronary artery calcium score in 2021 of 1118 (96%) for age and gender. She underwent Lexiscan in 2021 with inferior septal wall scar.  She is wearing her monitor today, has stopped her new diet. Is needing labs today for cardiology. Remains active with walking daily. She monitors her BP at home which ranges 110's/70's.   2) Anxiety: Chronic and suspected to be contributing to palpitations per cardiology. She endorses daily, chronic anxiety, is requesting a refill of her Xanax for which she uses infrequently. No recent refill. She does not want to take anything daily for her symptoms, Zoloft made her feel "awful".   3) Osteoporosis: On bone density scan from 2021. She is compliant to some calcium and vitamin D, walks daily.     BP Readings from Last 3 Encounters:  01/27/21 (!) 162/88  01/24/21 (!) 136/92  11/08/20 117/61     Review of Systems  Respiratory:  Negative for shortness of breath.   Cardiovascular:  Positive for palpitations. Negative for chest pain.  Musculoskeletal:  Negative for arthralgias.  Neurological:  Negative for dizziness and headaches.  Psychiatric/Behavioral:  The patient is nervous/anxious.         Past Medical History:  Diagnosis Date   Allergy    Heart murmur     Hyperlipidemia    Prediabetes     Social History   Socioeconomic History   Marital status: Divorced    Spouse name: Not on file   Number of children: Not on file   Years of education: Not on file   Highest education level: Not on file  Occupational History   Not on file  Tobacco Use   Smoking status: Never   Smokeless tobacco: Never  Vaping Use   Vaping Use: Never used  Substance and Sexual Activity   Alcohol use: Yes    Alcohol/week: 1.0 standard drink    Types: 1 Cans of beer per week    Comment: Once a month    Drug use: Never   Sexual activity: Not on file  Other Topics Concern   Not on file  Social History Narrative   Not on file   Social Determinants of Health   Financial Resource Strain: Not on file  Food Insecurity: Not on file  Transportation Needs: Not on file  Physical Activity: Not on file  Stress: Not on file  Social Connections: Not on file  Intimate Partner Violence: Not on file    Past Surgical History:  Procedure Laterality Date   APPENDECTOMY  08/20/1968   CESAREAN SECTION  08/20/1984   OOPHORECTOMY Right 08/20/1968    Family History  Problem Relation Age of Onset   Alcohol abuse Mother    Arthritis Mother    Heart disease Mother    Hypertension  Mother    Mental illness Mother    Stroke Mother    Hypothyroidism Mother    Stroke Father    Heart disease Father    Hearing loss Father    Alcohol abuse Sister    Lung cancer Sister    Berenice Primas' disease Sister    Heart disease Maternal Grandmother    Diabetes Maternal Grandfather    Heart disease Paternal Grandmother    Breast cancer Neg Hx     Allergies  Allergen Reactions   Red Yeast Rice Extract     Muscle pain   Zetia [Ezetimibe]     Nausea/stomach pain/gagging   Zoloft [Sertraline] Anxiety    Current Outpatient Medications on File Prior to Visit  Medication Sig Dispense Refill   Ascorbic Acid (VITAMIN C) 1000 MG tablet Take 1,000 mg by mouth daily.     Coenzyme Q10 (CO Q  10 PO) Take by mouth. Three times weekly.     ELDERBERRY PO Take by mouth daily as needed.     GARLIC PO Take by mouth daily.     Hyaluronic Acid-Vitamin C (HYALURONIC ACID PO) Take 100 mg by mouth daily.     Menaquinone-7 (VITAMIN K2 PO) Take by mouth. 3 DAYS WEEKLY     Misc Natural Products (LUTEIN 20 PO) Take 20 mg by mouth daily.     Misc Natural Products (WHITE WILLOW BARK PO) Take by mouth. Patient states she takes this for pain PRN     Multiple Vitamin (MULTIVITAMIN) tablet Take 1 tablet by mouth daily.     NATTOKINASE PO Take by mouth. Qd.     NON FORMULARY Take 1 drop by mouth daily. Haw[employer]orn [employer]     NON FORMULARY Bone Up 1 capsule bid.     NON FORMULARY BCQ 3 days a week.     NON FORMULARY Collagen Powder Takes 1 scoop daily.     NON FORMULARY [employer]     Omega-3 Fatty Acids (FISH OIL PO) Take by mouth. Three times a week.     RESVERATROL PO Take 1 capsule by mouth 3 (three) times a week.     VITAMIN D, CHOLECALCIFEROL, PO Take by mouth daily.     Zinc 50 MG CAPS Take 50 mg by mouth as needed.     No current facility-administered medications on file prior to visit.    BP (!) 162/88 Comment: pt states she has white coat syndrome  Pulse 78   Temp 97.8 F (36.6 C) (Temporal)   Ht 5' (1.524 m)   Wt 118 lb 4 oz (53.6 kg)   SpO2 97%   BMI 23.09 kg/m  Objective:   Physical Exam Cardiovascular:     Rate and Rhythm: Normal rate and regular rhythm.  Pulmonary:     Effort: Pulmonary effort is normal.     Breath sounds: Normal breath sounds.  Musculoskeletal:     Cervical back: Neck supple.  Skin:    General: Skin is warm and dry.  Neurological:     Mental Status: She is alert.  Psychiatric:        Mood and Affect: Mood normal.          Assessment & Plan:      This visit occurred during the SARS-CoV-2 public health emergency.  Safety protocols were in place, including screening questions prior to the visit, additional usage of staff PPE, and extensive  cleaning of exam room while observing appropriate contact time as indicated for disinfecting  solutions.

## 2021-01-27 NOTE — Assessment & Plan Note (Signed)
Not on any treatment, cannot tolerate statins/zetia, declines PCSK9 inhibitor.  Repeat lipids pending.

## 2021-01-27 NOTE — Assessment & Plan Note (Signed)
Compliant to calcium and vitamin D, commended her on daily walking.   Repeat bone density scan due next year.

## 2021-01-27 NOTE — Assessment & Plan Note (Signed)
Repeat A1C pending. 

## 2021-01-27 NOTE — Patient Instructions (Signed)
Schedule a lab appointment for when you are ready to have labs drawn.  Continue regular exercise.  It was a pleasure to see you today!

## 2021-01-30 ENCOUNTER — Other Ambulatory Visit (INDEPENDENT_AMBULATORY_CARE_PROVIDER_SITE_OTHER): Payer: Medicare Other

## 2021-01-30 ENCOUNTER — Other Ambulatory Visit: Payer: Self-pay

## 2021-01-30 DIAGNOSIS — E7849 Other hyperlipidemia: Secondary | ICD-10-CM

## 2021-01-30 DIAGNOSIS — R002 Palpitations: Secondary | ICD-10-CM | POA: Diagnosis not present

## 2021-01-30 DIAGNOSIS — R7303 Prediabetes: Secondary | ICD-10-CM

## 2021-01-30 LAB — COMPREHENSIVE METABOLIC PANEL
ALT: 14 U/L (ref 0–35)
AST: 19 U/L (ref 0–37)
Albumin: 4.4 g/dL (ref 3.5–5.2)
Alkaline Phosphatase: 107 U/L (ref 39–117)
BUN: 16 mg/dL (ref 6–23)
CO2: 29 mEq/L (ref 19–32)
Calcium: 9.6 mg/dL (ref 8.4–10.5)
Chloride: 103 mEq/L (ref 96–112)
Creatinine, Ser: 0.86 mg/dL (ref 0.40–1.20)
GFR: 66.31 mL/min (ref >60.00)
Glucose, Bld: 103 mg/dL — ABNORMAL HIGH (ref 70–99)
Potassium: 4.3 mEq/L (ref 3.5–5.1)
Sodium: 140 mEq/L (ref 135–145)
Total Bilirubin: 0.6 mg/dL (ref 0.2–1.2)
Total Protein: 6.9 g/dL (ref 6.0–8.3)

## 2021-01-30 LAB — CBC WITH DIFFERENTIAL/PLATELET
Basophils Absolute: 0 10*3/uL (ref 0.0–0.1)
Basophils Relative: 0.8 % (ref 0.0–3.0)
Eosinophils Absolute: 0 10*3/uL (ref 0.0–0.7)
Eosinophils Relative: 0.8 % (ref 0.0–5.0)
HCT: 42.6 % (ref 36.0–46.0)
Hemoglobin: 14.2 g/dL (ref 12.0–15.0)
Lymphocytes Relative: 20.2 % (ref 12.0–46.0)
Lymphs Abs: 1.2 10*3/uL (ref 0.7–4.0)
MCHC: 33.3 g/dL (ref 30.0–36.0)
MCV: 87.6 fl (ref 78.0–100.0)
Monocytes Absolute: 0.4 10*3/uL (ref 0.1–1.0)
Monocytes Relative: 7.1 % (ref 3.0–12.0)
Neutro Abs: 4.3 10*3/uL (ref 1.4–7.7)
Neutrophils Relative %: 71.1 % (ref 43.0–77.0)
Platelets: 220 10*3/uL (ref 150.0–400.0)
RBC: 4.86 Mil/uL (ref 3.87–5.11)
RDW: 13.8 % (ref 11.5–15.5)
WBC: 6 10*3/uL (ref 4.0–10.5)

## 2021-01-30 LAB — HEMOGLOBIN A1C: Hgb A1c MFr Bld: 6 % (ref 4.6–6.5)

## 2021-01-30 LAB — MAGNESIUM: Magnesium: 2.1 mg/dL (ref 1.5–2.5)

## 2021-01-30 LAB — TSH: TSH: 1.8 u[IU]/mL (ref 0.35–4.50)

## 2021-01-30 NOTE — Addendum Note (Signed)
Addended by: Cloyd Stagers on: 01/30/2021 09:14 AM   Modules accepted: Orders

## 2021-02-01 LAB — NMR, LIPOPROFILE
Cholesterol, Total: 262 mg/dL — ABNORMAL HIGH (ref 100–199)
HDL Particle Number: 36.5 umol/L (ref >=30.5)
HDL-C: 83 mg/dL (ref >39)
LDL Particle Number: 1850 nmol/L — ABNORMAL HIGH (ref <1000)
LDL Size: 21.2 nm (ref >20.5)
LDL-C (NIH Calc): 163 mg/dL — ABNORMAL HIGH (ref 0–99)
LP-IR Score: <25 (ref <=45)
Small LDL Particle Number: 453 nmol/L (ref <=527)
Triglycerides: 94 mg/dL (ref 0–149)

## 2021-02-01 LAB — LIPOPROTEIN A (LPA): Lipoprotein (a): 19 nmol/L (ref <75)

## 2021-02-27 ENCOUNTER — Ambulatory Visit: Payer: Medicare Other | Admitting: Physician Assistant

## 2021-03-03 ENCOUNTER — Telehealth: Payer: Self-pay | Admitting: *Deleted

## 2021-03-03 NOTE — Telephone Encounter (Signed)
-----   Message from Rise Mu, PA-C sent at 03/03/2021  3:23 PM EDT ----- Outpatient cardiac monitor showed a predominant rhythm of sinus with an average heart rate of 70 bpm (range 42-136 bpm), 2 runs of SVT (fast rhythm from the top portion of the heart) with the fastest and longest episode lasting just 8 beats, rare PACs and PVCs were noted. Patient triggered events did not correspond with arrhythmia. Follow up as planned to discuss if further management is needed, based on symptoms.

## 2021-03-03 NOTE — Telephone Encounter (Signed)
Left voicemail message on both home and mobile numbers listed with instructions to call back on Monday to review results.

## 2021-03-06 NOTE — Telephone Encounter (Signed)
Spoke with patient and reviewed her monitor results. We discussed the findings and all questions were answered. Confirmed her appointment and recommended she keep that to discuss her symptoms and any further concerns. She verbalized understanding of our conversation with no further questions at this time.

## 2021-03-23 NOTE — Progress Notes (Signed)
Cardiology Office Note    Date:  03/29/2021   ID:  Cynthia Collins, DOB [birth year], MRN [id number]  PCP:  Pleas Koch, NP  Cardiologist:  Kathlyn Sacramento, MD  Electrophysiologist:  None   Chief Complaint: Follow-up  History of Present Illness:   Cynthia Collins is a 75 y.o. female with history of paroxysmal SVT, severe hyperlipidemia, anxiety with whitecoat syndrome and underlying panic attack disorder, and family history of CAD who presents for follow up of Zio patch.   Prior echo in 10/2019 for evaluation of murmur showed an EF of 55 to 60%, no regional wall motion abnormalities, grade 1 diastolic dysfunction, normal RV systolic function and ventricular cavity size, normal PASP, mild aortic valve sclerosis without evidence of stenosis, and an estimated right atrial pressure of 3 mmHg.  Murmur noted was likely in the setting of mild aortic valve sclerosis.  She was referred to Dr. Fletcher Anon for evaluation of cardiovascular disease given severe hyperlipidemia with an LDL of greater than 200 historically.  She has chronic generalized myalgias and arthralgias with prior refusal of statin therapy.  Prior rheumatology work-up unrevealing.  Coronary artery calcium score in 12/2019 of 1118 which was the 25 percentile for age and sex matched control.  At baseline, she is active walking approximately 4 miles per day without exertional symptoms and works out with [employer].  Lexiscan MPI in 01/2020 showed a moderate in size, severe, fixed defect involving the inferior septal wall most consistent with scar though could not rule out artifact without significant ischemia being identified.  EF greater than 65%.  Attenuation corrected CT images demonstrated coronary artery calcification and aortic atherosclerosis.  Overall, this was an abnormal, probably low risk nuclear stress test.   With regards to her hyperlipidemia, she has not been interested in trying a statin and has documented intolerance to red yeast  rice and Zetia.  She has been evaluated by the lipid clinic with recommendation for PCSK9 inhibitor.  She preferred to research this prior to initiating therapy and when I saw her in 01/2021, she indicated she did not want to move forward with PCSK9 inhibitor.  She was seen in 01/2021 noting a 2 to 3-week history of intermittent randomly occurring palpitations that would occur approximately 3 times per day and last for several seconds in duration followed by spontaneous resolution.  There were no associated symptoms with these.  She did wonder if some recent holistic diet changes were contributing to her symptoms.  Zio patch in 02/2021 showed a predominant rhythm of sinus with an average heart rate of 70 bpm (range 42 to 136 bpm), 2 runs of SVT with the fastest and longest interval lasting just 8 beats, and rare PACs and PVCs.  Patient triggered events did not correlate with arrhythmia.  She comes in doing well from a cardiac perspective.  No chest pain, dyspnea, dizziness, presyncope, or syncope.  She has had 1 episode of tachypalpitations since she was last seen, occurring at the dentist office with noted increased stress/anxiety at that time.  Blood pressure at home has been well controlled in the low [id number] systolic.  With noted excessive heat, and lower extremity myalgias, she has not been as active as she typically is.  Otherwise, she does not have any issues or concerns at this time.     Labs independently reviewed: 01/2021 - LP (a) negative, LDL 163, TC 262, TG 94, HDL 83, Hgb 14.2, PLT 220, potassium 4.3, BUN 16, serum creatinine 0.86, albumin 4.4, AST/ALT  normal, TSH normal, A1c 6.0, magnesium 2.1   Past Medical History:  Diagnosis Date   Allergy    Heart murmur    Hyperlipidemia    Prediabetes     Past Surgical History:  Procedure Laterality Date   APPENDECTOMY  08/20/1968   CESAREAN SECTION  08/20/1984   OOPHORECTOMY Right 08/20/1968    Current Medications: Current Meds  Medication  Sig   ALPRAZolam (XANAX) 0.25 MG tablet Take 1 tablet by mouth daily as needed for anxiety. Use sparingly.   Ascorbic Acid (VITAMIN C) 1000 MG tablet Take 1,000 mg by mouth daily.   Coenzyme Q10 (CO Q 10 PO) Take by mouth. Three times weekly.   ELDERBERRY PO Take by mouth daily as needed.   GARLIC PO Take by mouth daily.   Hyaluronic Acid-Vitamin C (HYALURONIC ACID PO) Take 100 mg by mouth daily.   Menaquinone-7 (VITAMIN K2 PO) Take by mouth. 3 DAYS WEEKLY   Misc Natural Products (LUTEIN 20 PO) Take 20 mg by mouth daily.   Misc Natural Products (WHITE WILLOW BARK PO) Take by mouth. Patient states she takes this for pain PRN   Multiple Vitamin (MULTIVITAMIN) tablet Take 1 tablet by mouth daily.   NATTOKINASE PO Take by mouth. Qd.   NON FORMULARY Take 1 drop by mouth daily. Haw[employer]orn Supreme   NON FORMULARY Bone Up 1 capsule bid.   NON FORMULARY BCQ 3 days a week.   NON FORMULARY Collagen Powder Takes 1 scoop daily.   Omega-3 Fatty Acids (FISH OIL PO) Take by mouth. Three times a week.   RESVERATROL PO Take 1 capsule by mouth 3 (three) times a week.   VITAMIN D, CHOLECALCIFEROL, PO Take by mouth daily.   Zinc 50 MG CAPS Take 50 mg by mouth as needed.    Allergies:   Red yeast rice extract, Statins, Zetia [ezetimibe], and Zoloft [sertraline]   Social History   Socioeconomic History   Marital status: Divorced    Spouse name: Not on file   Number of children: Not on file   Years of education: Not on file   Highest education level: Not on file  Occupational History   Not on file  Tobacco Use   Smoking status: Never   Smokeless tobacco: Never  Vaping Use   Vaping Use: Never used  Substance and Sexual Activity   Alcohol use: Yes    Alcohol/week: 1.0 standard drink    Types: 1 Cans of beer per week    Comment: Once a month    Drug use: Never   Sexual activity: Not on file  Other Topics Concern   Not on file  Social History Narrative   Not on file   Social Determinants of  Health   Financial Resource Strain: Not on file  Food Insecurity: Not on file  Transportation Needs: Not on file  Physical Activity: Not on file  Stress: Not on file  Social Connections: Not on file     Family History:  The patient's family history includes Alcohol abuse in her mother and sister; Arthritis in her mother; Diabetes in her maternal grandfather; Berenice Primas' disease in her sister; Hearing loss in her father; Heart disease in her father, maternal grandmother, mother, and paternal grandmother; Hypertension in her mother; Hypothyroidism in her mother; Lung cancer in her sister; Mental illness in her mother; Stroke in her father and mother. There is no history of Breast cancer.  ROS:   Review of Systems  Constitutional:  Negative for chills,  diaphoresis, fever, malaise/fatigue and weight loss.  HENT:  Negative for congestion.   Eyes:  Negative for discharge and redness.  Respiratory:  Negative for cough, sputum production, shortness of breath and wheezing.   Cardiovascular:  Negative for chest pain, palpitations, orthopnea, claudication, leg swelling and PND.  Gastrointestinal:  Negative for abdominal pain, heartburn, nausea and vomiting.  Musculoskeletal:  Positive for myalgias. Negative for falls.  Skin:  Negative for rash.  Neurological:  Negative for dizziness, tingling, tremors, sensory change, speech change, focal weakness, loss of consciousness and weakness.  Endo/Heme/Allergies:  Does not bruise/bleed easily.  Psychiatric/Behavioral:  Negative for substance abuse. The patient is nervous/anxious.     EKGs/Labs/Other Studies Reviewed:    Studies reviewed were summarized above. The additional studies were reviewed today:  Lexiscan MPI 01/2020: Abnormal, probably low risk exercise myocardial perfusion stress test. There is a moderate in size, severe, fixed defect involving the inferoseptal wall most consistent with scar but cannot rule out artifact. No significant ischemia  was identified. The left ventricular ejection fraction is normal (>65%). Attenuation correction CT demonstrates coronary artery calcification and aortic atherosclerosis. __________   Calcium score 12/2019: 1. Coronary calcium score of 1118 . This was 96th percentile for age and sex matched control. 2. High calcium score. Recommend guideline directed medical therapy and risk factor modification. __________   2D echo 10/2019: 1. Left ventricular ejection fraction, by estimation, is 55 to 60%. The  left ventricle has normal function. The left ventricle has no regional  wall motion abnormalities. Left ventricular diastolic parameters are  consistent with Grade I diastolic  dysfunction (impaired relaxation).   2. Right ventricular systolic function is normal. The right ventricular  size is normal. There is normal pulmonary artery systolic pressure.   3. The mitral valve is normal in structure. No evidence of mitral valve  regurgitation. No evidence of mitral stenosis.   4. The aortic valve is tricuspid. Aortic valve regurgitation is not  visualized. Mild aortic valve sclerosis is present, with no evidence of  aortic valve stenosis.   5. The inferior vena cava is normal in size with greater than 50%  respiratory variability, suggesting right atrial pressure of 3 mmHg.   EKG:  EKG is ordered today.  The EKG ordered today demonstrates NSR, 81 bpm, no acute ST-T changes  Recent Labs: 01/30/2021: ALT 14; BUN 16; Creatinine, Ser 0.86; Hemoglobin 14.2; Magnesium 2.1; Platelets 220.0; Potassium 4.3; Sodium 140; TSH 1.80  Recent Lipid Panel    Component Value Date/Time   CHOL 281 (H) 09/30/2020 0835   TRIG 78 09/30/2020 0835   HDL 78 09/30/2020 0835   CHOLHDL 3.6 09/30/2020 0835   CHOLHDL 3 03/02/2020 0847   VLDL 20.0 03/02/2020 0847   LDLCALC 191 (H) 09/30/2020 0835    PHYSICAL EXAM:    VS:  BP 140/76 (BP Location: Left Arm, Patient Position: Sitting, Cuff Size: Normal)   Pulse 81    Ht 5' (1.524 m)   Wt 119 lb 6 oz (54.1 kg)   SpO2 98%   BMI 23.31 kg/m   BMI: Body mass index is 23.31 kg/m.  Physical Exam Vitals reviewed.  Constitutional:      Appearance: She is well-developed.  HENT:     Head: Normocephalic and atraumatic.  Eyes:     General:        Right eye: No discharge.        Left eye: No discharge.  Neck:     Vascular: No JVD.  Cardiovascular:  Rate and Rhythm: Normal rate and regular rhythm.     Pulses:          Dorsalis pedis pulses are 2+ on the right side and 2+ on the left side.       Posterior tibial pulses are 2+ on the right side and 2+ on the left side.     Heart sounds: S1 normal and S2 normal. Heart sounds not distant. No midsystolic click and no opening snap. Murmur heard.  Systolic murmur is present with a grade of 1/6 at the upper right sternal border.    No friction rub.  Pulmonary:     Effort: Pulmonary effort is normal. No respiratory distress.     Breath sounds: Normal breath sounds. No decreased breath sounds, wheezing or rales.  Chest:     Chest wall: No tenderness.  Abdominal:     General: There is no distension.     Palpations: Abdomen is soft.     Tenderness: There is no abdominal tenderness.  Musculoskeletal:     Cervical back: Normal range of motion.     Right lower leg: No edema.     Left lower leg: No edema.  Skin:    General: Skin is warm and dry.     Nails: There is no clubbing.  Neurological:     Mental Status: She is alert and oriented to person, place, and time.  Psychiatric:        Speech: Speech normal.        Behavior: Behavior normal.        Thought Content: Thought content normal.        Judgment: Judgment normal.    Wt Readings from Last 3 Encounters:  03/29/21 119 lb 6 oz (54.1 kg)  01/27/21 118 lb 4 oz (53.6 kg)  01/24/21 117 lb (53.1 kg)     ASSESSMENT & PLAN:   Palpitations/paroxysmal SVT: Improved following discontinuation of step 1 foods diet.  Outpatient cardiac monitoring did not  reveal any significant sustained arrhythmia.  No indication for escalation of medical therapy at this time.  CAD involving the native coronary arteries without angina with associated severe hyperlipidemia: She is doing well without symptoms concerning for angina.  She has been evaluated by lipid clinic and declines further treatment at this time.  Recent Lexiscan MPI showed no evidence of ischemia.  Continued aggressive resector modification recommended.  No indication for further ischemic testing at this time.  Aortic valve sclerosis: No evidence of stenosis on recent echo.  Anxiety/panic attack: Well-controlled.  Follow-up with PCP as directed.  Disposition: F/u with Dr. Fletcher Anon or an APP in 12 months, sooner if needed.   Medication Adjustments/Labs and Tests Ordered: Current medicines are reviewed at length with the patient today.  Concerns regarding medicines are outlined above. Medication changes, Labs and Tests ordered today are summarized above and listed in the Patient Instructions accessible in Encounters.   Signed, Christell Faith, PA-C 03/29/2021 3:20 PM     [address], [place] [address] [phone]

## 2021-03-29 ENCOUNTER — Encounter: Payer: Self-pay | Admitting: Physician Assistant

## 2021-03-29 ENCOUNTER — Ambulatory Visit (INDEPENDENT_AMBULATORY_CARE_PROVIDER_SITE_OTHER): Payer: Medicare Other | Admitting: Physician Assistant

## 2021-03-29 ENCOUNTER — Other Ambulatory Visit: Payer: Self-pay

## 2021-03-29 VITALS — BP 140/76 | HR 81 | Ht 60.0 in | Wt 119.4 lb

## 2021-03-29 DIAGNOSIS — I2510 Atherosclerotic heart disease of native coronary artery without angina pectoris: Secondary | ICD-10-CM | POA: Diagnosis not present

## 2021-03-29 DIAGNOSIS — R002 Palpitations: Secondary | ICD-10-CM

## 2021-03-29 DIAGNOSIS — I358 Other nonrheumatic aortic valve disorders: Secondary | ICD-10-CM | POA: Diagnosis not present

## 2021-03-29 DIAGNOSIS — I471 Supraventricular tachycardia: Secondary | ICD-10-CM

## 2021-03-29 DIAGNOSIS — F419 Anxiety disorder, unspecified: Secondary | ICD-10-CM

## 2021-03-29 NOTE — Patient Instructions (Signed)
Medication Instructions:  No changes at this time.  *If you need a refill on your cardiac medications before your next appointment, please call your pharmacy*   Lab Work: None  If you have labs (blood work) drawn today and your tests are completely normal, you will receive your results only by: Cynthia Collins (if you have MyChart) OR A paper copy in the mail If you have any lab test that is abnormal or we need to change your treatment, we will call you to review the results.   Testing/Procedures: None   Follow-Up: At [facility], you and your health needs are our priority.  As part of our continuing mission to provide you with exceptional heart care, we have created designated Provider Care Teams.  These Care Teams include your primary Cardiologist (physician) and Advanced Practice Providers (APPs -  Physician Assistants and Nurse Practitioners) who all work together to provide you with the care you need, when you need it.  Your next appointment:   1 year(s)  The format for your next appointment:   In Person  Provider:   You may see Kathlyn Sacramento, MD or one of the following Advanced Practice Providers on your designated Care Team:   Murray Hodgkins, NP Christell Faith, PA-C Marrianne Mood, PA-C [place], [place]

## 2021-08-03 ENCOUNTER — Other Ambulatory Visit: Payer: Self-pay

## 2021-08-03 ENCOUNTER — Encounter: Payer: Self-pay | Admitting: Dermatology

## 2021-08-03 ENCOUNTER — Ambulatory Visit (INDEPENDENT_AMBULATORY_CARE_PROVIDER_SITE_OTHER): Payer: Medicare Other | Admitting: Dermatology

## 2021-08-03 DIAGNOSIS — D485 Neoplasm of uncertain behavior of skin: Secondary | ICD-10-CM

## 2021-08-03 DIAGNOSIS — Z1283 Encounter for screening for malignant neoplasm of skin: Secondary | ICD-10-CM | POA: Diagnosis not present

## 2021-08-03 DIAGNOSIS — D1800 Hemangioma unspecified site: Secondary | ICD-10-CM | POA: Diagnosis not present

## 2021-08-03 DIAGNOSIS — L814 Other melanin hyperpigmentation: Secondary | ICD-10-CM

## 2021-08-03 DIAGNOSIS — Z808 Family history of malignant neoplasm of other organs or systems: Secondary | ICD-10-CM

## 2021-08-03 DIAGNOSIS — D489 Neoplasm of uncertain behavior, unspecified: Secondary | ICD-10-CM

## 2021-08-03 DIAGNOSIS — D229 Melanocytic nevi, unspecified: Secondary | ICD-10-CM | POA: Diagnosis not present

## 2021-08-03 DIAGNOSIS — L821 Other seborrheic keratosis: Secondary | ICD-10-CM

## 2021-08-03 DIAGNOSIS — C4492 Squamous cell carcinoma of skin, unspecified: Secondary | ICD-10-CM

## 2021-08-03 DIAGNOSIS — L578 Other skin changes due to chronic exposure to nonionizing radiation: Secondary | ICD-10-CM | POA: Diagnosis not present

## 2021-08-03 DIAGNOSIS — D045 Carcinoma in situ of skin of trunk: Secondary | ICD-10-CM | POA: Diagnosis not present

## 2021-08-03 HISTORY — DX: Squamous cell carcinoma of skin, unspecified: C44.92

## 2021-08-03 MED ORDER — MUPIROCIN 2 % EX OINT: TOPICAL_OINTMENT | CUTANEOUS | 0 refills | Status: DC

## 2021-08-03 NOTE — Patient Instructions (Addendum)

## 2021-08-03 NOTE — Progress Notes (Signed)
New Patient Visit  Subjective  Cynthia Collins is a 75 y.o. female who presents for the following: Annual Exam (No hx of skin CA, Fhx of MM in sister, ). Patient brought a list of skin lesions that she would like checked today - R cheek, R neck, L neck R breast, abdomen, between shoulder blades.   The following portions of the chart were reviewed this encounter and updated as appropriate:   Tobacco   Allergies   Meds   Problems   Med Hx   Surg Hx   Fam Hx       Review of Systems:  No other skin or systemic complaints except as noted in HPI or Assessment and Plan.  Objective  Well appearing patient in no apparent distress; mood and affect are within normal limits.  A full examination was performed including scalp, head, eyes, ears, nose, lips, neck, chest, axillae, abdomen, back, buttocks, bilateral upper extremities, bilateral lower extremities, hands, feet, fingers, toes, fingernails, and toenails. All findings within normal limits unless otherwise noted below.  R chest 0.5 cm pink papule soft without features suspicious for malignancy on dermoscopy.   Mid chest 0.35 cm brown and pink papule     L post upper thigh 0.2 cm brown macule with surrounding white halo.      Assessment & Plan  Neoplasm of uncertain behavior R chest  Favor neurofibroma vs nevus - Benign-appearing.  Observation.  Call clinic for new or changing lesions.  Recommend daily use of broad spectrum spf 30+ sunscreen to sun-exposed areas.    Family history of melanoma Sister  Neoplasm of uncertain behavior of skin (2) Mid chest  Epidermal / dermal shaving  Lesion diameter (cm):  0.3 Informed consent: discussed and consent obtained   Timeout: patient name, date of birth, surgical site, and procedure verified   Procedure prep:  Patient was prepped and draped in usual sterile fashion Prep type:  Isopropyl alcohol Anesthesia: the lesion was anesthetized in a standard fashion   Anesthetic:  1%  lidocaine w/ epinephrine 1-100,000 buffered w/ 8.4% NaHCO3 Instrument used: flexible razor blade   Hemostasis achieved with: pressure, aluminum chloride and electrodesiccation   Outcome: patient tolerated procedure well   Post-procedure details: sterile dressing applied and wound care instructions given   Dressing type: bandage and petrolatum    mupirocin ointment (BACTROBAN) 2 % Apply to wounds QD until healed.  Specimen 1 - Surgical pathology Differential Diagnosis: D48.5 nevus r/o atypia vs SK vs BCC  Check Margins: No  L post upper thigh  Epidermal / dermal shaving  Lesion diameter (cm):  0.2 Informed consent: discussed and consent obtained   Timeout: patient name, date of birth, surgical site, and procedure verified   Procedure prep:  Patient was prepped and draped in usual sterile fashion Prep type:  Isopropyl alcohol Anesthesia: the lesion was anesthetized in a standard fashion   Anesthetic:  1% lidocaine w/ epinephrine 1-100,000 buffered w/ 8.4% NaHCO3 Instrument used: flexible razor blade   Hemostasis achieved with: pressure, aluminum chloride and electrodesiccation   Outcome: patient tolerated procedure well   Post-procedure details: sterile dressing applied and wound care instructions given   Dressing type: bandage and petrolatum    Specimen 2 - Surgical pathology Differential Diagnosis: D48.5 halo nevus r/o atypia Check Margins: No  Start Mupirocin 2% ointment to healing wounds QD until healed.   Lentigines - Scattered tan macules - Due to sun exposure - Benign-appearing, observe - Recommend daily broad spectrum sunscreen SPF  30+ to sun-exposed areas, reapply every 2 hours as needed. - Call for any changes  Seborrheic Keratoses - Stuck-on, waxy, tan-brown papules and/or plaques  - Benign-appearing - Discussed benign etiology and prognosis. - Observe - Call for any changes  Melanocytic Nevi - Tan-brown and/or pink-flesh-colored symmetric macules and  papules - Benign appearing on exam today - Observation - Call clinic for new or changing moles - Recommend daily use of broad spectrum spf 30+ sunscreen to sun-exposed areas.   Hemangiomas - Red papules - Discussed benign nature - Observe - Call for any changes  Actinic Damage - Chronic condition, secondary to cumulative UV/sun exposure - diffuse scaly erythematous macules with underlying dyspigmentation - Recommend daily broad spectrum sunscreen SPF 30+ to sun-exposed areas, reapply every 2 hours as needed.  - Staying in the shade or wearing long sleeves, sun glasses (UVA+UVB protection) and wide brim hats (4-inch brim around the entire circumference of the hat) are also recommended for sun protection.  - Call for new or changing lesions.  Skin cancer screening performed today.  Return in about 1 year (around 08/03/2022) for TBSE.  Luther Redo, CMA, am acting as scribe for Forest Gleason, MD .   Documentation: I have reviewed the above documentation for accuracy and completeness, and I agree with the above.  Forest Gleason, MD

## 2021-08-17 ENCOUNTER — Telehealth: Payer: Self-pay

## 2021-08-17 NOTE — Telephone Encounter (Signed)
Patient scheduled for [facility] 09/07/21 at 12./js

## 2021-09-07 ENCOUNTER — Other Ambulatory Visit: Payer: Self-pay

## 2021-09-07 ENCOUNTER — Encounter: Payer: Self-pay | Admitting: Dermatology

## 2021-09-07 ENCOUNTER — Ambulatory Visit (INDEPENDENT_AMBULATORY_CARE_PROVIDER_SITE_OTHER): Payer: Medicare Other | Admitting: Dermatology

## 2021-09-07 DIAGNOSIS — L821 Other seborrheic keratosis: Secondary | ICD-10-CM

## 2021-09-07 DIAGNOSIS — L578 Other skin changes due to chronic exposure to nonionizing radiation: Secondary | ICD-10-CM

## 2021-09-07 DIAGNOSIS — D045 Carcinoma in situ of skin of trunk: Secondary | ICD-10-CM | POA: Diagnosis not present

## 2021-09-07 DIAGNOSIS — D099 Carcinoma in situ, unspecified: Secondary | ICD-10-CM

## 2021-09-07 NOTE — Progress Notes (Signed)
° °  Follow-Up Visit   Subjective  Cynthia Collins is a 76 y.o. female who presents for the following: Bx proven SCCIS (Of the mid chest - patient is here today for treatment of skin cancer). She has other skin lesions on the face that she would like checked today.   The following portions of the chart were reviewed this encounter and updated as appropriate:   Tobacco   Allergies   Meds   Problems   Med Hx   Surg Hx   Fam Hx       Review of Systems:  No other skin or systemic complaints except as noted in HPI or Assessment and Plan.  Objective  Well appearing patient in no apparent distress; mood and affect are within normal limits.  A focused examination was performed including the face and chest. Relevant physical exam findings are noted in the Assessment and Plan.  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  Destruction of lesion Complexity: extensive   Destruction method: electrodesiccation and curettage   Informed consent: discussed and consent obtained   Timeout:  patient name, date of birth, surgical site, and procedure verified Procedure prep:  Patient was prepped and draped in usual sterile fashion Prep type:  Isopropyl alcohol Anesthesia: the lesion was anesthetized in a standard fashion   Anesthetic:  1% lidocaine w/ epinephrine 1-100,000 buffered w/ 8.4% NaHCO3 Curettage performed in three different directions: Yes   Electrodesiccation performed over the curetted area: Yes   Curettage cycles:  3 Final wound size (cm):  1.1 Hemostasis achieved with:  pressure, aluminum chloride and electrodesiccation Outcome: patient tolerated procedure well with no complications   Post-procedure details: sterile dressing applied and wound care instructions given   Dressing type: bandage and petrolatum     Seborrheic Keratoses - Face, chest - Stuck-on, waxy, tan-brown papules and/or plaques  - Benign-appearing - Discussed benign  etiology and prognosis. - Observe - Call for any changes  Actinic Damage - chronic, secondary to cumulative UV radiation exposure/sun exposure over time - diffuse scaly erythematous macules with underlying dyspigmentation - Recommend daily broad spectrum sunscreen SPF 30+ to sun-exposed areas, reapply every 2 hours as needed.  - Recommend staying in the shade or wearing long sleeves, sun glasses (UVA+UVB protection) and wide brim hats (4-inch brim around the entire circumference of the hat). - Call for new or changing lesions.  Return in about 6 months (around 03/07/2022) for TBSE.  Luther Redo, CMA, am acting as scribe for Forest Gleason, MD .  Documentation: I have reviewed the above documentation for accuracy and completeness, and I agree with the above.  Forest Gleason, MD

## 2021-09-07 NOTE — Patient Instructions (Signed)
Electrodesiccation and Curettage (Scrape and Burn) Wound Care Instructions  Leave the original bandage on for 24 hours if possible.  If the bandage becomes soaked or soiled before that time, it is OK to remove it and examine the wound.  A small amount of post-operative bleeding is normal.  If excessive bleeding occurs, remove the bandage, place gauze over the site and apply continuous pressure (no peeking) over the area for 30 minutes. If this does not work, please call our clinic as soon as possible or page your doctor if it is after hours.   Once a day, cleanse the wound with soap and water. It is fine to shower. If a thick crust develops you may use a Q-tip dipped into dilute hydrogen peroxide (mix 1:1 with water) to dissolve it.  Hydrogen peroxide can slow the healing process, so use it only as needed.    After washing, apply petroleum jelly (Vaseline) or an antibiotic ointment if your doctor prescribed one for you, followed by a bandage.    For best healing, the wound should be covered with a layer of ointment at all times. If you are not able to keep the area covered with a bandage to hold the ointment in place, this may mean re-applying the ointment several times a day.  Continue this wound care until the wound has healed and is no longer open. It may take several weeks for the wound to heal and close.  Itching and mild discomfort is normal during the healing process.  If you have any discomfort, you can take Tylenol (acetaminophen) or ibuprofen as directed on the bottle. (Please do not take these if you have an allergy to them or cannot take them for another reason).  Some redness, tenderness and white or yellow material in the wound is normal healing.  If the area becomes very sore and red, or develops a thick yellow-green material (pus), it may be infected; please notify us.    Wound healing continues for up to one year following surgery. It is not unusual to experience pain in the scar  from time to time during the interval.  If the pain becomes severe or the scar thickens, you should notify the office.    A slight amount of redness in a scar is expected for the first six months.  After six months, the redness will fade and the scar will soften and fade.  The color difference becomes less noticeable with time.  If there are any problems, return for a post-op surgery check at your earliest convenience.  To improve the appearance of the scar, you can use silicone scar gel, cream, or sheets (such as Mederma or Serica) every night for up to one year. These are available over the counter (without a prescription).  Please call our office at [phone] for any questions or concerns.  If You Need Anything After Your Visit  If you have any questions or concerns for your doctor, please call our main line at [phone] and press option 4 to reach your doctor's medical assistant. If no one answers, please leave a voicemail as directed and we will return your call as soon as possible. Messages left after 4 pm will be answered the following business day.   You may also send [place] a message via [place]. We typically respond to MyChart messages within 1-2 business days.  For prescription refills, please ask your pharmacy to contact our office. Our fax number is [phone].  If you have an  urgent issue when the clinic is closed that cannot wait until the next business day, you can page your doctor at the number below.    Please note that while we do our best to be available for urgent issues outside of office hours, we are not available 24/7.   If you have an urgent issue and are unable to reach [place], you may choose to seek medical care at your doctor's office, retail clinic, urgent care center, or emergency room.  If you have a medical emergency, please immediately call 911 or go to the emergency department.  Pager Numbers  - Dr. Nehemiah Massed: [phone]  - Dr. Laurence Ferrari: [phone]  -  Dr. Nicole Kindred: [phone]  In the event of inclement weather, please call our main line at [phone] for an update on the status of any delays or closures.  Dermatology Medication Tips: Please keep the boxes that topical medications come in in order to help keep track of the instructions about where and how to use these. Pharmacies typically print the medication instructions only on the boxes and not directly on the medication tubes.   If your medication is too expensive, please contact our office at [phone] option 4 or send [place] a message through [place].   We are unable to tell what your co-pay for medications will be in advance as this is different depending on your insurance coverage. However, we may be able to find a substitute medication at lower cost or fill out paperwork to get insurance to cover a needed medication.   If a prior authorization is required to get your medication covered by your insurance company, please allow [place] 1-2 business days to complete this process.  Drug prices often vary depending on where the prescription is filled and some pharmacies may offer cheaper prices.  The website www.goodrx.com contains coupons for medications through different pharmacies. The prices here do not account for what the cost may be with help from insurance (it may be cheaper with your insurance), but the website can give you the price if you did not use any insurance.  - You can print the associated coupon and take it with your prescription to the pharmacy.  - You may also stop by our office during regular business hours and pick up a GoodRx coupon card.  - If you need your prescription sent electronically to a different pharmacy, notify our office through [facility] or by phone at [phone] option 4.     Si Usted Necesita Algo Despus de Su Visita  Tambin puede enviarnos un mensaje a travs de Pharmacist, community. Por lo general respondemos a los mensajes de MyChart en el  transcurso de 1 a 2 das hbiles.  Para renovar recetas, por favor pida a su farmacia que se ponga en contacto con nuestra oficina. Harland Dingwall de fax es [place] [phone].  Si tiene un asunto urgente cuando la clnica est cerrada y que no puede esperar hasta el siguiente da hbil, puede llamar/localizar a su doctor(a) al nmero que aparece a continuacin.   Por favor, tenga en cuenta que aunque hacemos todo lo posible para estar disponibles para asuntos urgentes fuera del horario de [place], no estamos disponibles las 24 horas del da, los 7 das de la [place].   Si tiene un problema urgente y no puede comunicarse con nosotros, puede optar por buscar atencin mdica  en el consultorio de su doctor(a), en una clnica privada, en un centro de atencin urgente o en una sala de emergencias.  Si tiene Engineering geologist, por favor llame inmediatamente al 911 o vaya a la sala de emergencias.  Nmeros de bper  - Dr. Nehemiah Massed: [phone]  - Dra. Moye: [phone]  - Dra. Nicole Kindred: [phone]  En caso de inclemencias del [place], por favor llame a Johnsie Kindred principal al [phone] para una actualizacin sobre el [place] de cualquier retraso o cierre.  Consejos para la medicacin en dermatologa: Por favor, guarde las cajas en las que vienen los medicamentos de uso tpico para ayudarle a seguir las instrucciones sobre dnde y cmo usarlos. Las farmacias generalmente imprimen las instrucciones del medicamento slo en las cajas y no directamente en los tubos del [place].   Si su medicamento es muy caro, por favor, pngase en contacto con Zigmund Daniel llamando al [phone] y presione la opcin 4 o envenos un mensaje a travs de Pharmacist, community.   No podemos decirle cul ser su copago por los medicamentos por adelantado ya que esto es diferente dependiendo de la cobertura de su seguro. Sin embargo, es posible que podamos encontrar un medicamento sustituto a Electrical engineer un  formulario para que el seguro cubra el medicamento que se considera necesario.   Si se requiere una autorizacin previa para que su compaa de seguros [place] su medicamento, por favor permtanos de 1 a 2 das hbiles para completar este proceso.  Los precios de los medicamentos varan con frecuencia dependiendo del Environmental consultant de dnde se surte la receta y alguna farmacias pueden ofrecer precios ms baratos.  El sitio web www.goodrx.com tiene cupones para medicamentos de Airline pilot. Los precios aqu no tienen en cuenta lo que podra costar con la ayuda del seguro (puede ser ms barato con su seguro), pero el sitio web puede darle el precio si no utiliz Research scientist (physical sciences).  - Puede imprimir el cupn correspondiente y llevarlo con su receta a la farmacia.  - Tambin puede pasar por nuestra oficina durante el horario de atencin regular y Charity fundraiser una tarjeta de cupones de GoodRx.  - Si necesita que su receta se enve electrnicamente a una farmacia diferente, informe a nuestra oficina a travs de MyChart de [place] o por telfono llamando al [phone] y presione la opcin 4.

## 2021-09-18 ENCOUNTER — Encounter: Payer: Self-pay | Admitting: Dermatology

## 2022-01-23 ENCOUNTER — Telehealth: Payer: Self-pay | Admitting: Primary Care

## 2022-01-23 NOTE — Telephone Encounter (Signed)
Left message for patient to call back and schedule Medicare Annual Wellness Visit (AWV) either virtually or phone   awvi 03/21/19 per [place]  please schedule at anytime with health coach  This should be a 45 minute visit.  I left my direct # [phone]

## 2022-01-26 ENCOUNTER — Ambulatory Visit (INDEPENDENT_AMBULATORY_CARE_PROVIDER_SITE_OTHER): Payer: Medicare Other

## 2022-01-26 VITALS — Ht 60.0 in | Wt 116.6 lb

## 2022-01-26 DIAGNOSIS — Z0000 Encounter for general adult medical examination without abnormal findings: Secondary | ICD-10-CM

## 2022-01-26 NOTE — Progress Notes (Signed)
I connected with Cieanna Stormes today by telephone and verified that I am speaking with the correct person using two identifiers. Location patient: home Location provider: work Persons participating in the virtual visit: Hayleigh Bawa, Glenna Durand LPN.   I discussed the limitations, risks, security and privacy concerns of performing an evaluation and management service by telephone and the availability of in person appointments. I also discussed with the patient that there may be a patient responsible charge related to this service. The patient expressed understanding and verbally consented to this telephonic visit.    Interactive audio and video telecommunications were attempted between this provider and patient, however failed, due to patient having technical difficulties OR patient did not have access to video capability.  We continued and completed visit with audio only.     Vital signs may be patient reported or missing.  Subjective:   Cynthia Collins is a 76 y.o. female who presents for an Initial Medicare Annual Wellness Visit.  Review of Systems     Cardiac Risk Factors include: advanced age (>64mn, >>66women);dyslipidemia     Objective:    Today's Vitals   01/26/22 1042  Weight: 116 lb 9.6 oz (52.9 kg)  Height: 5' (1.524 m)   Body mass index is 22.77 kg/m.     01/26/2022   10:53 AM  Advanced Directives  Does Patient Have a Medical Advance Directive? Yes  Type of AParamedicof [place]Living will  Copy of [place] Chart? No - copy requested    Current Medications (verified) Outpatient Encounter Medications as of 01/26/2022  Medication Sig   ALPRAZolam (XANAX) 0.25 MG tablet Take 1 tablet by mouth daily as needed for anxiety. Use sparingly.   Ascorbic Acid (VITAMIN C) 1000 MG tablet Take 1,000 mg by mouth daily.   Coenzyme Q10 (CO Q 10 PO) Take by mouth. Three times weekly.   ELDERBERRY PO Take by mouth daily  as needed.   GARLIC PO Take by mouth daily.   Hyaluronic Acid-Vitamin C (HYALURONIC ACID PO) Take 100 mg by mouth daily.   Menaquinone-7 (VITAMIN K2 PO) Take by mouth. 3 DAYS WEEKLY   Misc Natural Products (LUTEIN 20 PO) Take 20 mg by mouth daily.   Misc Natural Products (WHITE WILLOW BARK PO) Take by mouth. Patient states she takes this for pain PRN   Multiple Vitamin (MULTIVITAMIN) tablet Take 1 tablet by mouth daily.   NATTOKINASE PO Take by mouth. Qd.   NON FORMULARY Take 1 drop by mouth daily. [place] Supreme   NON FORMULARY Bone Up 1 capsule bid.   NON FORMULARY BCQ 3 days a week.   NON FORMULARY Collagen Powder Takes 1 scoop daily.   Omega-3 Fatty Acids (FISH OIL PO) Take by mouth. Three times a week.   RESVERATROL PO Take 1 capsule by mouth 3 (three) times a week.   VITAMIN D, CHOLECALCIFEROL, PO Take by mouth daily.   Zinc 50 MG CAPS Take 50 mg by mouth as needed.   mupirocin ointment (BACTROBAN) 2 % Apply to wounds QD until healed. (Patient not taking: Reported on 01/26/2022)   No facility-administered encounter medications on file as of 01/26/2022.    Allergies (verified) Red yeast rice extract, Statins, Zetia [ezetimibe], and Zoloft [sertraline]   History: Past Medical History:  Diagnosis Date   Allergy    Heart murmur    Hyperlipidemia    Prediabetes    Squamous cell carcinoma of skin 08/03/2021   Mid chest -  S[facility]IS ED&C 09/07/21   Past Surgical History:  Procedure Laterality Date   APPENDECTOMY  08/20/1968   CESAREAN SECTION  08/20/1984   OOPHORECTOMY Right 08/20/1968   Family History  Problem Relation Age of Onset   Alcohol abuse Mother    Arthritis Mother    Heart disease Mother    Hypertension Mother    Mental illness Mother    Stroke Mother    Hypothyroidism Mother    Stroke Father    Heart disease Father    Hearing loss Father    Alcohol abuse Sister    Lung cancer Sister    Graves' disease Sister    Heart disease Maternal Grandmother    Diabetes  Maternal Grandfather    Heart disease Paternal Grandmother    Breast cancer Neg Hx    Social History   Socioeconomic History   Marital status: Divorced    Spouse name: Not on file   Number of children: Not on file   Years of education: Not on file   Highest education level: Not on file  Occupational History   Not on file  Tobacco Use   Smoking status: Never   Smokeless tobacco: Never  Vaping Use   Vaping Use: Never used  Substance and Sexual Activity   Alcohol use: Not Currently    Alcohol/week: 1.0 standard drink of alcohol    Types: 1 Cans of beer per week    Comment: Once a month    Drug use: Never   Sexual activity: Not on file  Other Topics Concern   Not on file  Social History Narrative   Not on file   Social Determinants of Health   Financial Resource Strain: Low Risk  (01/26/2022)   Overall Financial Resource Strain (CARDIA)    Difficulty of Paying Living Expenses: Not hard at all  Food Insecurity: No Food Insecurity (01/26/2022)   Hunger Vital Sign    Worried About Running Out of Food in the Last Year: Never true    Ran Out of Food in the Last Year: Never true  Transportation Needs: No Transportation Needs (01/26/2022)   PRAPARE - Hydrologist (Medical): No    Lack of Transportation (Non-Medical): No  Physical Activity: Sufficiently Active (01/26/2022)   Exercise Vital Sign    Days of Exercise per Week: 7 days    Minutes of Exercise per Session: 90 min  Stress: Stress Concern Present (01/26/2022)   [place]    Feeling of Stress : To some extent  Social Connections: Not on file    Tobacco Counseling Counseling given: Not Answered   Clinical Intake:  Pre-visit preparation completed: Yes  Pain : No/denies pain     Nutritional Status: BMI of 19-24  Normal Nutritional Risks: None Diabetes: No  How often do you need to have someone help you when you read  instructions, pamphlets, or other written materials from your doctor or pharmacy?: 1 - Never What is the last grade level you completed in school?: junior college  Diabetic? no  Interpreter Needed?: No  Information entered by :: NAllen LPN   Activities of Daily Living    01/26/2022   10:55 AM 01/24/2022   12:34 PM  In your present state of health, do you have any difficulty performing the following activities:  Hearing? 0 0  Vision? 0 0  Difficulty concentrating or making decisions? 0 0  Walking or climbing stairs? 0 0  Dressing or bathing? 0 0  Doing errands, shopping? 0 0  Preparing Food and eating ? N N  Using the Toilet? N N  In the past six months, have you accidently leaked urine? N N  Do you have problems with loss of bowel control? N N  Managing your Medications? N N  Managing your Finances? N N  Housekeeping or managing your Housekeeping? N N    Patient Care Team: Pleas Koch, NP as PCP - General (Internal Medicine) Wellington Hampshire, MD as PCP - Cardiology (Cardiology)  Indicate any recent Medical Services you may have received from other than [facility] providers in the past year (date may be approximate).     Assessment:   This is a routine wellness examination for Cynthia Collins.  Hearing/Vision screen Vision Screening - Comments:: Regular eye exams, Dr. Jeni Salles, [facility]  Dietary issues and exercise activities discussed: Current Exercise Habits: Home exercise routine, Type of exercise: yoga;strength training/weights;walking, Time (Minutes): > 60, Frequency (Times/Week): 7, Weekly Exercise (Minutes/Week): 0   Goals Addressed             This Visit's Progress    Patient Stated       01/26/2022, get over anxiety       Depression Screen    01/26/2022   10:54 AM  PHQ 2/9 Scores  PHQ - 2 Score 0    Fall Risk    01/26/2022   10:54 AM 01/24/2022   12:34 PM 01/27/2021   12:02 PM  [place] in the past year? 0 0 0  Number falls in past  yr: 0 0 0  Injury with Fall? 0 0   Risk for fall due to : Medication side effect    Follow up Falls evaluation completed;Education provided;Falls prevention discussed      FALL RISK PREVENTION PERTAINING TO THE HOME:  Any stairs in or around the home? Yes  If so, are there any without handrails? No  Home free of loose throw rugs in walkways, pet beds, electrical cords, etc? Yes  Adequate lighting in your home to reduce risk of falls? Yes   ASSISTIVE DEVICES UTILIZED TO PREVENT FALLS:  Life alert? No  Use of a cane, walker or w/c? No  Grab bars in the bathroom? Yes  Shower chair or bench in shower? No  Elevated toilet seat or a handicapped toilet? No   TIMED UP AND GO:  Was the test performed? No .      Cognitive Function:        01/26/2022   10:56 AM  6CIT Screen  What Year? 0 points  What month? 0 points  What time? 0 points  Count back from 20 0 points  Months in reverse 0 points  Repeat phrase 0 points  Total Score 0 points    Immunizations Immunization History  Administered Date(s) Administered   Influenza,inj,Quad PF,6+ Mos 06/02/2020   PFIZER(Purple Top)SARS-COV-2 Vaccination 11/30/2019, 12/30/2019   Tdap 08/20/2005, 05/19/2020    TDAP status: Up to date  Flu Vaccine status: Declined, Education has been provided regarding the importance of this vaccine but patient still declined. Advised may receive this vaccine at local pharmacy or Health Dept. Aware to provide a copy of the vaccination record if obtained from local pharmacy or Health Dept. Verbalized acceptance and understanding.  Pneumococcal vaccine status: Declined,  Education has been provided regarding the importance of this vaccine but patient still declined. Advised may receive this vaccine at local  pharmacy or Health Dept. Aware to provide a copy of the vaccination record if obtained from local pharmacy or Health Dept. Verbalized acceptance and understanding.   Covid-19 vaccine status: Completed  vaccines  Qualifies for Shingles Vaccine? Yes   Zostavax completed No   Shingrix Completed?: No.    Education has been provided regarding the importance of this vaccine. Patient has been advised to call insurance company to determine out of pocket expense if they have not yet received this vaccine. Advised may also receive vaccine at local pharmacy or Health Dept. Verbalized acceptance and understanding.  Screening Tests Health Maintenance  Topic Date Due   Hepatitis C Screening  Never done   Zoster Vaccines- Shingrix (1 of 2) Never done   Pneumonia Vaccine 67+ Years old (1 - PCV) Never done   COLONOSCOPY (Pts 45-13yr Insurance coverage will need to be confirmed)  08/20/2018   COVID-19 Vaccine (3 - Pfizer risk series) 01/27/2020   INFLUENZA VACCINE  03/20/2022   TETANUS/TDAP  05/19/2030   DEXA SCAN  Completed   HPV VACCINES  Aged Out    Health Maintenance  Health Maintenance Due  Topic Date Due   Hepatitis C Screening  Never done   Zoster Vaccines- Shingrix (1 of 2) Never done   Pneumonia Vaccine 76 Years old (1 - PCV) Never done   COLONOSCOPY (Pts 45-440yrInsurance coverage will need to be confirmed)  08/20/2018   COVID-19 Vaccine (3 - Pfizer risk series) 01/27/2020    Colorectal cancer screening: No longer required.   Mammogram status: No longer required due to age.  Bone Density status: Completed 11/25/2019.   Lung Cancer Screening: (Low Dose CT Chest recommended if Age 76-80ears, 30 pack-year currently smoking OR have quit w/in 15years.) does not qualify.   Lung Cancer Screening Referral: no  Additional Screening:  Hepatitis C Screening: does qualify;   Vision Screening: Recommended annual ophthalmology exams for early detection of glaucoma and other disorders of the eye. Is the patient up to date with their annual eye exam?  Yes  Who is the provider or what is the name of the office in which the patient attends annual eye exams? [facility] is not  established with a provider, would they like to be referred to a provider to establish care? No .   Dental Screening: Recommended annual dental exams for proper oral hygiene  Community Resource Referral / Chronic Care Management: CRR required this visit?  No   CCM required this visit?  No      Plan:     I have personally reviewed and noted the following in the patient's chart:   Medical and social history Use of alcohol, tobacco or illicit drugs  Current medications and supplements including opioid prescriptions. Patient is not currently taking opioid prescriptions. Functional ability and status Nutritional status Physical activity Advanced directives List of other physicians Hospitalizations, surgeries, and ER visits in previous 12 months Vitals Screenings to include cognitive, depression, and falls Referrals and appointments  In addition, I have reviewed and discussed with patient certain preventive protocols, quality metrics, and best practice recommendations. A written personalized care plan for preventive services as well as general preventive health recommendations were provided to patient.     NiKellie SimmeringLPN   6/0/04/3817 Nurse Notes: none  Due to this being a virtual visit, the after visit summary with patients personalized plan was offered to patient via mail or my-chart.  Patient would like to  access on my-chart

## 2022-01-26 NOTE — Patient Instructions (Signed)
[name] , Thank you for taking time to come for your Medicare Wellness Visit. I appreciate your ongoing commitment to your health goals. Please review the following plan we discussed and let me know if I can assist you in the future.   Screening recommendations/referrals: Colonoscopy: decline Mammogram: not required Bone Density: completed 11/25/2019 Recommended yearly ophthalmology/optometry visit for glaucoma screening and checkup Recommended yearly dental visit for hygiene and checkup  Vaccinations: Influenza vaccine: decline Pneumococcal vaccine: decline Tdap vaccine: completed 05/19/2020, due 05/19/2030 Shingles vaccine: decline   Covid-19: 12/30/2019, 11/30/2019  Advanced directives: Please bring a copy of your POA (Power of Attorney) and/or Living Will to your next appointment.   Conditions/risks identified: none  Next appointment: Follow up in one year for your annual wellness visit    Preventive Care 65 Years and Older, Female Preventive care refers to lifestyle choices and visits with your health care provider that can promote health and wellness. What does preventive care include? A yearly physical exam. This is also called an annual well check. Dental exams once or twice a year. Routine eye exams. Ask your health care provider how often you should have your eyes checked. Personal lifestyle choices, including: Daily care of your teeth and gums. Regular physical activity. Eating a healthy diet. Avoiding tobacco and drug use. Limiting alcohol use. Practicing safe sex. Taking low-dose aspirin every day. Taking vitamin and mineral supplements as recommended by your health care provider. What happens during an annual well check? The services and screenings done by your health care provider during your annual well check will depend on your age, overall health, lifestyle risk factors, and family history of disease. Counseling  Your health care provider may ask you  questions about your: Alcohol use. Tobacco use. Drug use. Emotional well-being. Home and relationship well-being. Sexual activity. Eating habits. History of falls. Memory and ability to understand (cognition). Work and work Statistician. Reproductive health. Screening  You may have the following tests or measurements: Height, weight, and BMI. Blood pressure. Lipid and cholesterol levels. These may be checked every 5 years, or more frequently if you are over 45 years old. Skin check. Lung cancer screening. You may have this screening every year starting at age 96 if you have a 30-pack-year history of smoking and currently smoke or have quit within the past 15 years. Fecal occult blood test (FOBT) of the stool. You may have this test every year starting at age 83. Flexible sigmoidoscopy or colonoscopy. You may have a sigmoidoscopy every 5 years or a colonoscopy every 10 years starting at age 57. Hepatitis C blood test. Hepatitis B blood test. Sexually transmitted disease (STD) testing. Diabetes screening. This is done by checking your blood sugar (glucose) after you have not eaten for a while (fasting). You may have this done every 1-3 years. Bone density scan. This is done to screen for osteoporosis. You may have this done starting at age 69. Mammogram. This may be done every 1-2 years. Talk to your health care provider about how often you should have regular mammograms. Talk with your health care provider about your test results, treatment options, and if necessary, the need for more tests. Vaccines  Your health care provider may recommend certain vaccines, such as: Influenza vaccine. This is recommended every year. Tetanus, diphtheria, and acellular pertussis (Tdap, Td) vaccine. You may need a Td booster every 10 years. Zoster vaccine. You may need this after age 55. Pneumococcal 13-valent conjugate (PCV13) vaccine. One dose is recommended after age  65. Pneumococcal polysaccharide  (PPSV23) vaccine. One dose is recommended after age 34. Talk to your health care provider about which screenings and vaccines you need and how often you need them. This information is not intended to replace advice given to you by your health care provider. Make sure you discuss any questions you have with your health care provider. Document Released: 09/02/2015 Document Revised: 04/25/2016 Document Reviewed: 06/07/2015 Elsevier Interactive Patient Education  2017 [place] Prevention in the Home Falls can cause injuries. They can happen to people of all ages. There are many things you can do to make your home safe and to help prevent falls. What can I do on the outside of my home? Regularly fix the edges of walkways and driveways and fix any cracks. Remove anything that might make you trip as you walk through a door, such as a raised step or threshold. Trim any bushes or trees on the path to your home. Use bright outdoor lighting. Clear any walking paths of anything that might make someone trip, such as rocks or tools. Regularly check to see if handrails are loose or broken. Make sure that both sides of any steps have handrails. Any raised decks and porches should have guardrails on the edges. Have any leaves, snow, or ice cleared regularly. Use sand or salt on walking paths during winter. Clean up any spills in your garage right away. This includes oil or grease spills. What can I do in the bathroom? Use night lights. Install grab bars by the toilet and in the tub and shower. Do not use towel bars as grab bars. Use non-skid mats or decals in the tub or shower. If you need to sit down in the shower, use a plastic, non-slip stool. Keep the floor dry. Clean up any water that spills on the floor as soon as it happens. Remove soap buildup in the tub or shower regularly. Attach bath mats securely with double-sided non-slip rug tape. Do not have throw rugs and other things on the  floor that can make you trip. What can I do in the bedroom? Use night lights. Make sure that you have a light by your bed that is easy to reach. Do not use any sheets or blankets that are too big for your bed. They should not hang down onto the floor. Have a firm chair that has side arms. You can use this for support while you get dressed. Do not have throw rugs and other things on the floor that can make you trip. What can I do in the kitchen? Clean up any spills right away. Avoid walking on wet floors. Keep items that you use a lot in easy-to-reach places. If you need to reach something above you, use a strong step stool that has a grab bar. Keep electrical cords out of the way. Do not use floor polish or wax that makes floors slippery. If you must use wax, use non-skid floor wax. Do not have throw rugs and other things on the floor that can make you trip. What can I do with my stairs? Do not leave any items on the stairs. Make sure that there are handrails on both sides of the stairs and use them. Fix handrails that are broken or loose. Make sure that handrails are as long as the stairways. Check any carpeting to make sure that it is firmly attached to the stairs. Fix any carpet that is loose or worn. Avoid having throw rugs at  the top or bottom of the stairs. If you do have throw rugs, attach them to the floor with carpet tape. Make sure that you have a light switch at the top of the stairs and the bottom of the stairs. If you do not have them, ask someone to add them for you. What else can I do to help prevent falls? Wear shoes that: Do not have high heels. Have rubber bottoms. Are comfortable and fit you well. Are closed at the toe. Do not wear sandals. If you use a stepladder: Make sure that it is fully opened. Do not climb a closed stepladder. Make sure that both sides of the stepladder are locked into place. Ask someone to hold it for you, if possible. Clearly mark and make  sure that you can see: Any grab bars or handrails. First and last steps. Where the edge of each step is. Use tools that help you move around (mobility aids) if they are needed. These include: Canes. Walkers. Scooters. Crutches. Turn on the lights when you go into a dark area. Replace any light bulbs as soon as they burn out. Set up your furniture so you have a clear path. Avoid moving your furniture around. If any of your floors are uneven, fix them. If there are any pets around you, be aware of where they are. Review your medicines with your doctor. Some medicines can make you feel dizzy. This can increase your chance of falling. Ask your doctor what other things that you can do to help prevent falls. This information is not intended to replace advice given to you by your health care provider. Make sure you discuss any questions you have with your health care provider. Document Released: 06/02/2009 Document Revised: 01/12/2016 Document Reviewed: 09/10/2014 Elsevier Interactive Patient Education  2017 [employer].

## 2022-02-06 ENCOUNTER — Other Ambulatory Visit: Payer: Self-pay | Admitting: Primary Care

## 2022-02-07 ENCOUNTER — Other Ambulatory Visit: Payer: Self-pay | Admitting: Primary Care

## 2022-02-07 DIAGNOSIS — Z1231 Encounter for screening mammogram for malignant neoplasm of breast: Secondary | ICD-10-CM

## 2022-02-09 ENCOUNTER — Ambulatory Visit
Admission: RE | Admit: 2022-02-09 | Discharge: 2022-02-09 | Disposition: A | Discharge status: Home / Self Care | Payer: Medicare Other | Source: Ambulatory Visit | Attending: Primary Care | Admitting: Primary Care

## 2022-02-09 DIAGNOSIS — Z1231 Encounter for screening mammogram for malignant neoplasm of breast: Secondary | ICD-10-CM | POA: Insufficient documentation

## 2022-03-08 ENCOUNTER — Ambulatory Visit (INDEPENDENT_AMBULATORY_CARE_PROVIDER_SITE_OTHER): Payer: Medicare Other | Admitting: Primary Care

## 2022-03-08 ENCOUNTER — Encounter: Payer: Self-pay | Admitting: Primary Care

## 2022-03-08 VITALS — BP 136/80 | HR 73 | Temp 97.7°F | Ht 59.5 in | Wt 116.4 lb

## 2022-03-08 DIAGNOSIS — E7849 Other hyperlipidemia: Secondary | ICD-10-CM | POA: Diagnosis not present

## 2022-03-08 DIAGNOSIS — R7303 Prediabetes: Secondary | ICD-10-CM | POA: Diagnosis not present

## 2022-03-08 DIAGNOSIS — M810 Age-related osteoporosis without current pathological fracture: Secondary | ICD-10-CM

## 2022-03-08 DIAGNOSIS — I2510 Atherosclerotic heart disease of native coronary artery without angina pectoris: Secondary | ICD-10-CM | POA: Diagnosis not present

## 2022-03-08 DIAGNOSIS — F419 Anxiety disorder, unspecified: Secondary | ICD-10-CM

## 2022-03-08 DIAGNOSIS — R002 Palpitations: Secondary | ICD-10-CM | POA: Diagnosis not present

## 2022-03-08 LAB — VITAMIN D 25 HYDROXY (VIT D DEFICIENCY, FRACTURES): VITD: 34.54 ng/mL (ref 30.00–100.00)

## 2022-03-08 LAB — CBC
HCT: 40 % (ref 36.0–46.0)
Hemoglobin: 13.4 g/dL (ref 12.0–15.0)
MCHC: 33.4 g/dL (ref 30.0–36.0)
MCV: 86.9 fl (ref 78.0–100.0)
Platelets: 224 10*3/uL (ref 150.0–400.0)
RBC: 4.61 Mil/uL (ref 3.87–5.11)
RDW: 13.6 % (ref 11.5–15.5)
WBC: 5.7 10*3/uL (ref 4.0–10.5)

## 2022-03-08 LAB — COMPREHENSIVE METABOLIC PANEL
ALT: 17 U/L (ref 0–35)
AST: 20 U/L (ref 0–37)
Albumin: 4.3 g/dL (ref 3.5–5.2)
Alkaline Phosphatase: 95 U/L (ref 39–117)
BUN: 16 mg/dL (ref 6–23)
CO2: 29 mEq/L (ref 19–32)
Calcium: 9.2 mg/dL (ref 8.4–10.5)
Chloride: 103 mEq/L (ref 96–112)
Creatinine, Ser: 0.75 mg/dL (ref 0.40–1.20)
GFR: 77.54 mL/min (ref >60.00)
Glucose, Bld: 94 mg/dL (ref 70–99)
Potassium: 4 mEq/L (ref 3.5–5.1)
Sodium: 141 mEq/L (ref 135–145)
Total Bilirubin: 0.6 mg/dL (ref 0.2–1.2)
Total Protein: 6.7 g/dL (ref 6.0–8.3)

## 2022-03-08 LAB — LIPID PANEL
Cholesterol: 268 mg/dL — ABNORMAL HIGH (ref 0–200)
HDL: 69.4 mg/dL (ref >39.00)
LDL Cholesterol: 181 mg/dL — ABNORMAL HIGH (ref 0–99)
NonHDL: 198.12
Total CHOL/HDL Ratio: 4
Triglycerides: 85 mg/dL (ref 0.0–149.0)
VLDL: 17 mg/dL (ref 0.0–40.0)

## 2022-03-08 LAB — MAGNESIUM: Magnesium: 2 mg/dL (ref 1.5–2.5)

## 2022-03-08 LAB — C-REACTIVE PROTEIN: CRP: <1 mg/dL (ref 0.5–20.0)

## 2022-03-08 LAB — TSH: TSH: 1.12 u[IU]/mL (ref 0.35–5.50)

## 2022-03-08 LAB — HEMOGLOBIN A1C: Hgb A1c MFr Bld: 6.1 % (ref 4.6–6.5)

## 2022-03-08 NOTE — Progress Notes (Signed)
Subjective:    Patient ID: Cynthia Collins, female    DOB: Oct 27, 1945, 76 y.o.   MRN: [id number]  HPI  Adel Neyer is a very pleasant 76 y.o. female with a history of osteoporosis, hyperlipidemia, anxiety, prediabetes, squamous cell carcinoma in situ, palpitations, CAD, aortic valve sclerosis who presents today for follow-up of chronic conditions.  Immunizations: -Tetanus: 2021 -Influenza: Did not complete last season  -Covid-19: 2 vaccines -Shingles: Never completed, declines today  -Pneumonia: Never completed, declines today  Mammogram: Completed in June 2023  Colonoscopy: Completed in 2010, declines further testing given her age.  Dexa: Completed in April 2021, declines today   1) CAD/Aortic Valve Sclerosis/Hyperlipidemia/Paroxysmal Atrial Fibrillation: Following with cardiology, last office visit was in August 2022.  History of outpatient cardiac monitoring in July 2022 which did not reveal any significant sustained arrhythmia.  Evaluated by the lipid clinic for cardiology, patient declined treatment at that time.  Last lipid panel was drawn in June 2022 which revealed LDL of 163, HDL of 83.  She is intolerant to statin therapy and Zetia.  She underwent a chemical stress test in 2021 which was negative for evidence of ischemia.  She underwent echocardiogram in 2021 which did not reveal aortic valve stenosis.  She plans on scheduling an appointment soon. She would like a C-reactive protein checked today.   2) Anxiety: Currently managed on alprazolam 0.25 mg PRN. Her last refill was for 10 pills in June 2022. Chronic anxiety for years, continues to struggle. She has declines daily SSRI treatment in the past. She follows with a therapist via phone every two weeks which helps.   3) Prediabetes: Chronic. Last A1c was in June 2022 at 6.0.  This was an increase from her A1c of 5.7 obtained in April 2022.  4) Osteoporosis: As evidenced from April 2021 with T score of -3.4. She  is taking calcium and vitamin D3 daily. She is walking 3 miles daily, lifts weights weekly, does yoga weekly.   She is not interested in bisphosphonate treatment. She declines repeat bone density scan.      Review of Systems  Respiratory:  Negative for shortness of breath.   Cardiovascular:  Negative for chest pain.  Neurological:  Negative for dizziness and headaches.         Past Medical History:  Diagnosis Date   Allergy    Heart murmur    Hyperlipidemia    Prediabetes    Squamous cell carcinoma of skin 08/03/2021   Mid chest - [facility] 09/07/21    Social History   Socioeconomic History   Marital status: Divorced    Spouse name: Not on file   Number of children: Not on file   Years of education: Not on file   Highest education level: Not on file  Occupational History   Not on file  Tobacco Use   Smoking status: Never   Smokeless tobacco: Never  Vaping Use   Vaping Use: Never used  Substance and Sexual Activity   Alcohol use: Not Currently    Alcohol/week: 1.0 standard drink of alcohol    Types: 1 Cans of beer per week    Comment: Once a month    Drug use: Never   Sexual activity: Not on file  Other Topics Concern   Not on file  Social History Narrative   Not on file   Social Determinants of Health   Financial Resource Strain: Low Risk  (01/26/2022)   Overall Financial Resource Strain (CARDIA)  Difficulty of Paying Living Expenses: Not hard at all  Food Insecurity: No Food Insecurity (01/26/2022)   Hunger Vital Sign    Worried About Running Out of Food in the Last Year: Never true    Ran Out of Food in the Last Year: Never true  Transportation Needs: No Transportation Needs (01/26/2022)   PRAPARE - Hydrologist (Medical): No    Lack of Transportation (Non-Medical): No  Physical Activity: Sufficiently Active (01/26/2022)   Exercise Vital Sign    Days of Exercise per Week: 7 days    Minutes of Exercise per Session: 90 min   Stress: Stress Concern Present (01/26/2022)   [place]    Feeling of Stress : To some extent  Social Connections: Not on file  Intimate Partner Violence: Not on file    Past Surgical History:  Procedure Laterality Date   APPENDECTOMY  08/20/1968   CESAREAN SECTION  08/20/1984   OOPHORECTOMY Right 08/20/1968    Family History  Problem Relation Age of Onset   Alcohol abuse Mother    Arthritis Mother    Heart disease Mother    Hypertension Mother    Mental illness Mother    Stroke Mother    Hypothyroidism Mother    Stroke Father    Heart disease Father    Hearing loss Father    Alcohol abuse Sister    Lung cancer Sister    Berenice Primas' disease Sister    Heart disease Maternal Grandmother    Diabetes Maternal Grandfather    Heart disease Paternal Grandmother    Breast cancer Neg Hx     Allergies  Allergen Reactions   Red Yeast Rice Extract     Muscle pain   Statins     Muscle pain  Joint pain     Zetia [Ezetimibe]     Nausea/stomach pain/gagging   Zoloft [Sertraline] Anxiety    Current Outpatient Medications on File Prior to Visit  Medication Sig Dispense Refill   ALPRAZolam (XANAX) 0.25 MG tablet Take 1 tablet by mouth daily as needed for anxiety. Use sparingly. 10 tablet 0   Ascorbic Acid (VITAMIN C) 1000 MG tablet Take 1,000 mg by mouth daily.     Coenzyme Q10 (CO Q 10 PO) Take by mouth. Three times weekly.     ELDERBERRY PO Take by mouth daily as needed.     GARLIC PO Take by mouth daily.     Hyaluronic Acid-Vitamin C (HYALURONIC ACID PO) Take 100 mg by mouth daily.     Menaquinone-7 (VITAMIN K2 PO) Take by mouth daily.     Misc Natural Products (LUTEIN 20 PO) Take 20 mg by mouth daily.     Misc Natural Products (WHITE WILLOW BARK PO) Take by mouth. Patient states she takes this for pain PRN     Multiple Vitamin (MULTIVITAMIN) tablet Take 1 tablet by mouth in the morning and at bedtime.      NATTOKINASE PO Take 1 capsule by mouth daily. Qd.     NON FORMULARY Take 1 capsule by mouth daily. [employer]     NON FORMULARY Bone Up 13 times a week     NON FORMULARY BCQ 3 days a week.     NON FORMULARY Collagen Powder Takes 1 scoop daily.     Omega-3 Fatty Acids (FISH OIL PO) Take by mouth. 4 times a week.     RESVERATROL PO Take 1 capsule by  mouth 3 (three) times a week.     TURMERIC CURCUMIN PO Take 1 capsule by mouth daily.     VITAMIN D, CHOLECALCIFEROL, PO Take by mouth daily.     Whey Protein POWD Take 1 Scoop by mouth daily.     Zinc 50 MG CAPS Take 50 mg by mouth as needed.     No current facility-administered medications on file prior to visit.    BP 136/80   Pulse 73   Temp 97.7 F (36.5 C) (Temporal)   Ht 4' 11.5" (1.511 m)   Wt 116 lb 6 oz (52.8 kg)   SpO2 98%   BMI 23.11 kg/m  Objective:   Physical Exam Cardiovascular:     Rate and Rhythm: Normal rate and regular rhythm.  Pulmonary:     Effort: Pulmonary effort is normal.     Breath sounds: Normal breath sounds.  Musculoskeletal:        General: Normal range of motion.     Cervical back: Neck supple.  Skin:    General: Skin is warm and dry.  Neurological:     Mental Status: She is alert.  Psychiatric:        Mood and Affect: Mood normal.           Assessment & Plan:   Problem List Items Addressed This Visit       Musculoskeletal and Integument   Osteoporosis    Declines repeat bone density scan. Declines bisphosphonate treatment.   Continue calcium and vitamin D daily Continue daily walking.       Relevant Orders   VITAMIN D 25 Hydroxy (Vit-D Deficiency, Fractures)     Other   Familial hyperlipidemia - Primary    Repeat lipid panel pending today.  She is intolerant to statins and Zetia. Following with cardiology.       Relevant Orders   Lipid panel   Comprehensive metabolic panel   CBC   Anxiety    Chronic and continued.   She continues to decline other treatment.   Continue with therapy every 2 weeks. Continue alprazolam 0.25 mg PRN, she is allotted 10 pills per year.       Prediabetes    Commended her on daily exercise and an overall healthy diet.   Repeat A1C pending.      Relevant Orders   Hemoglobin A1c   CBC   TSH   VITAMIN D 25 Hydroxy (Vit-D Deficiency, Fractures)   Palpitations    Resolved.      Relevant Orders   Magnesium       Pleas Koch, NP

## 2022-03-08 NOTE — Patient Instructions (Signed)
Stop by the lab prior to leaving today. I will notify you of your results once received.   Schedule a follow up visit with your cardiologist.   It was a pleasure to see you today!

## 2022-03-08 NOTE — Assessment & Plan Note (Signed)
Asymptomatic.  Following with cardiology, reviewed office notes from August 2022.  Continue regular exercise, continue to work on diet.

## 2022-03-08 NOTE — Assessment & Plan Note (Signed)
Declines repeat bone density scan. Declines bisphosphonate treatment.   Continue calcium and vitamin D daily Continue daily walking.

## 2022-03-08 NOTE — Assessment & Plan Note (Signed)
Commended her on daily exercise and an overall healthy diet.   Repeat A1C pending.

## 2022-03-08 NOTE — Assessment & Plan Note (Signed)
Repeat lipid panel pending today.  She is intolerant to statins and Zetia. Following with cardiology.

## 2022-03-08 NOTE — Assessment & Plan Note (Signed)
Chronic and continued.   She continues to decline other treatment.  Continue with therapy every 2 weeks. Continue alprazolam 0.25 mg PRN, she is allotted 10 pills per year.

## 2022-03-08 NOTE — Assessment & Plan Note (Signed)
Resolved

## 2022-03-14 ENCOUNTER — Ambulatory Visit (INDEPENDENT_AMBULATORY_CARE_PROVIDER_SITE_OTHER): Payer: Medicare Other | Admitting: Dermatology

## 2022-03-14 DIAGNOSIS — L578 Other skin changes due to chronic exposure to nonionizing radiation: Secondary | ICD-10-CM

## 2022-03-14 DIAGNOSIS — L710 Perioral dermatitis: Secondary | ICD-10-CM | POA: Diagnosis not present

## 2022-03-14 DIAGNOSIS — D1800 Hemangioma unspecified site: Secondary | ICD-10-CM

## 2022-03-14 DIAGNOSIS — L570 Actinic keratosis: Secondary | ICD-10-CM | POA: Diagnosis not present

## 2022-03-14 DIAGNOSIS — D229 Melanocytic nevi, unspecified: Secondary | ICD-10-CM

## 2022-03-14 DIAGNOSIS — L821 Other seborrheic keratosis: Secondary | ICD-10-CM | POA: Diagnosis not present

## 2022-03-14 DIAGNOSIS — Z1283 Encounter for screening for malignant neoplasm of skin: Secondary | ICD-10-CM | POA: Diagnosis not present

## 2022-03-14 DIAGNOSIS — L918 Other hypertrophic disorders of the skin: Secondary | ICD-10-CM

## 2022-03-14 DIAGNOSIS — L814 Other melanin hyperpigmentation: Secondary | ICD-10-CM | POA: Diagnosis not present

## 2022-03-14 DIAGNOSIS — Z86007 Personal history of in-situ neoplasm of skin: Secondary | ICD-10-CM

## 2022-03-14 NOTE — Patient Instructions (Addendum)
Cryotherapy Aftercare  Wash gently with soap and water everyday.   Apply Vaseline and Band-Aid daily until healed.   Recommend taking Heliocare sun protection supplement daily in sunny weather for additional sun protection. For maximum protection on the sunniest days, you can take up to 2 capsules of regular Heliocare OR take 1 capsule of Heliocare Ultra. For prolonged exposure (such as a full day in the sun), you can repeat your dose of the supplement 4 hours after your first dose. Heliocare can be purchased at [facility], at some [facility] or at www.heliocare.com.    Melanoma ABCDEs  Melanoma is the most dangerous type of skin cancer, and is the leading cause of death from skin disease.  You are more likely to develop melanoma if you: Have light-colored skin, light-colored eyes, or red or blond hair Spend a lot of time in the sun Tan regularly, either outdoors or in a tanning bed Have had blistering sunburns, especially during childhood Have a close family member who has had a melanoma Have atypical moles or large birthmarks  Early detection of melanoma is key since treatment is typically straightforward and cure rates are extremely high if we catch it early.   The first sign of melanoma is often a change in a mole or a new dark spot.  The ABCDE system is a way of remembering the signs of melanoma.  A for asymmetry:  The two halves do not match. B for border:  The edges of the growth are irregular. C for color:  A mixture of colors are present instead of an even brown color. D for diameter:  Melanomas are usually (but not always) greater than 6mm - the size of a pencil eraser. E for evolution:  The spot keeps changing in size, shape, and color.  Please check your skin once per month between visits. You can use a small mirror in front and a large mirror behind you to keep an eye on the back side or your body.   If you see any new or changing lesions before your next follow-up,  please call to schedule a visit.  Please continue daily skin protection including broad spectrum sunscreen SPF 30+ to sun-exposed areas, reapplying every 2 hours as needed when you're outdoors.    Due to recent changes in healthcare laws, you may see results of your pathology and/or laboratory studies on MyChart before the doctors have had a chance to review them. We understand that in some cases there may be results that are confusing or concerning to you. Please understand that not all results are received at the same time and often the doctors may need to interpret multiple results in order to provide you with the best plan of care or course of treatment. Therefore, we ask that you please give us 2 business days to thoroughly review all your results before contacting the office for clarification. Should we see a critical lab result, you will be contacted sooner.   If You Need Anything After Your Visit  If you have any questions or concerns for your doctor, please call our main line at [phone] and press option 4 to reach your doctor's medical assistant. If no one answers, please leave a voicemail as directed and we will return your call as soon as possible. Messages left after 4 pm will be answered the following business day.   You may also send us a message via MyChart. We typically respond to MyChart messages within 1-2 business days.    For prescription refills, please ask your pharmacy to contact our office. Our fax number is [phone].  If you have an urgent issue when the clinic is closed that cannot wait until the next business day, you can page your doctor at the number below.    Please note that while we do our best to be available for urgent issues outside of office hours, we are not available 24/7.   If you have an urgent issue and are unable to reach us, you may choose to seek medical care at your doctor's office, retail clinic, urgent care center, or emergency room.  If you  have a medical emergency, please immediately call 911 or go to the emergency department.  Pager Numbers  - Dr. Kowalski: [phone]  - Dr. Moye: [phone]  - Dr. Stewart: [phone]  In the event of inclement weather, please call our main line at [phone] for an update on the status of any delays or closures.  Dermatology Medication Tips: Please keep the boxes that topical medications come in in order to help keep track of the instructions about where and how to use these. Pharmacies typically print the medication instructions only on the boxes and not directly on the medication tubes.   If your medication is too expensive, please contact our office at [phone] option 4 or send us a message through MyChart.   We are unable to tell what your co-pay for medications will be in advance as this is different depending on your insurance coverage. However, we may be able to find a substitute medication at lower cost or fill out paperwork to get insurance to cover a needed medication.   If a prior authorization is required to get your medication covered by your insurance company, please allow us 1-2 business days to complete this process.  Drug prices often vary depending on where the prescription is filled and some pharmacies may offer cheaper prices.  The website www.goodrx.com contains coupons for medications through different pharmacies. The prices here do not account for what the cost may be with help from insurance (it may be cheaper with your insurance), but the website can give you the price if you did not use any insurance.  - You can print the associated coupon and take it with your prescription to the pharmacy.  - You may also stop by our office during regular business hours and pick up a GoodRx coupon card.  - If you need your prescription sent electronically to a different pharmacy, notify our office through [facility] MyChart or by phone at [phone] option  4.     Si Usted Necesita Algo Despus de Su Visita  Tambin puede enviarnos un mensaje a travs de MyChart. Por lo general respondemos a los mensajes de MyChart en el transcurso de 1 a 2 das hbiles.  Para renovar recetas, por favor pida a su farmacia que se ponga en contacto con nuestra oficina. Nuestro nmero de fax es el [phone].  Si tiene un asunto urgente cuando la clnica est cerrada y que no puede esperar hasta el siguiente da hbil, puede llamar/localizar a su doctor(a) al nmero que aparece a continuacin.   Por favor, tenga en cuenta que aunque hacemos todo lo posible para estar disponibles para asuntos urgentes fuera del horario de oficina, no estamos disponibles las 24 horas del da, los 7 das de la semana.   Si tiene un problema urgente y no puede comunicarse con nosotros, puede optar por buscar atencin mdica  en   el consultorio de su doctor(a), en una clnica privada, en un centro de atencin urgente o en una sala de emergencias.  Si tiene una emergencia mdica, por favor llame inmediatamente al 911 o vaya a la sala de emergencias.  Nmeros de bper  - Dr. Kowalski: [phone]  - Dra. Moye: [phone]  - Dra. Stewart: [phone]  En caso de inclemencias del tiempo, por favor llame a nuestra lnea principal al [phone] para una actualizacin sobre el estado de cualquier retraso o cierre.  Consejos para la medicacin en dermatologa: Por favor, guarde las cajas en las que vienen los medicamentos de uso tpico para ayudarle a seguir las instrucciones sobre dnde y cmo usarlos. Las farmacias generalmente imprimen las instrucciones del medicamento slo en las cajas y no directamente en los tubos del medicamento.   Si su medicamento es muy caro, por favor, pngase en contacto con nuestra oficina llamando al [phone] y presione la opcin 4 o envenos un mensaje a travs de MyChart.   No podemos decirle cul ser su copago por los medicamentos por  adelantado ya que esto es diferente dependiendo de la cobertura de su seguro. Sin embargo, es posible que podamos encontrar un medicamento sustituto a menor costo o llenar un formulario para que el seguro cubra el medicamento que se considera necesario.   Si se requiere una autorizacin previa para que su compaa de seguros cubra su medicamento, por favor permtanos de 1 a 2 das hbiles para completar este proceso.  Los precios de los medicamentos varan con frecuencia dependiendo del lugar de dnde se surte la receta y alguna farmacias pueden ofrecer precios ms baratos.  El sitio web www.goodrx.com tiene cupones para medicamentos de diferentes farmacias. Los precios aqu no tienen en cuenta lo que podra costar con la ayuda del seguro (puede ser ms barato con su seguro), pero el sitio web puede darle el precio si no utiliz ningn seguro.  - Puede imprimir el cupn correspondiente y llevarlo con su receta a la farmacia.  - Tambin puede pasar por nuestra oficina durante el horario de atencin regular y recoger una tarjeta de cupones de GoodRx.  - Si necesita que su receta se enve electrnicamente a una farmacia diferente, informe a nuestra oficina a travs de MyChart de [facility] o por telfono llamando al [phone] y presione la opcin 4.  

## 2022-03-14 NOTE — Progress Notes (Signed)
Follow-Up Visit   Subjective  Cynthia Collins is a 76 y.o. female who presents for the following: FBSE (The patient presents for Total-Body Skin Exam (TBSE) for skin cancer screening and mole check.  The patient has spots, moles and lesions to be evaluated, some may be new or changing and the patient has concerns that these could be cancer. Patient with hx of SCCis. ).  Patient with a spot at hairline, right cheek that you have looked at previously. She has developed a rash at corners of mouth that will sometimes itch. Patient also with a place at left scalp that sometimes crusts and feels like a divot.   The following portions of the chart were reviewed this encounter and updated as appropriate:   Tobacco  Allergies  Meds  Problems  Med Hx  Surg Hx  Fam Hx      Review of Systems:  No other skin or systemic complaints except as noted in HPI or Assessment and Plan.  Objective  Well appearing patient in no apparent distress; mood and affect are within normal limits.  A full examination was performed including scalp, head, eyes, ears, nose, lips, neck, chest, axillae, abdomen, back, buttocks, bilateral upper extremities, bilateral lower extremities, hands, feet, fingers, toes, fingernails, and toenails. All findings within normal limits unless otherwise noted below.  right cheek Erythematous thin papules/macules with gritty scale.   perioral Few scattered inflammatory papules perioral favoring along wrinkles    Assessment & Plan  AK (actinic keratosis) right cheek  Actinic keratoses are precancerous spots that appear secondary to cumulative UV radiation exposure/sun exposure over time. They are chronic with expected duration over 1 year. A portion of actinic keratoses will progress to squamous cell carcinoma of the skin. It is not possible to reliably predict which spots will progress to skin cancer and so treatment is recommended to prevent development of skin  cancer.  Recommend daily broad spectrum sunscreen SPF 30+ to sun-exposed areas, reapply every 2 hours as needed.  Recommend staying in the shade or wearing long sleeves, sun glasses (UVA+UVB protection) and wide brim hats (4-inch brim around the entire circumference of the hat). Call for new or changing lesions.  Prior to procedure, discussed risks of blister formation, small wound, skin dyspigmentation, or rare scar following cryotherapy. Recommend Vaseline ointment to treated areas while healing.   Destruction of lesion - right cheek Complexity: simple   Destruction method: cryotherapy   Informed consent: discussed and consent obtained   Timeout:  patient name, date of birth, surgical site, and procedure verified Lesion destroyed using liquid nitrogen: Yes   Region frozen until ice ball extended beyond lesion: Yes   Outcome: patient tolerated procedure well with no complications   Post-procedure details: wound care instructions given    Perioral dermatitis perioral  +/- angular cheilitis  Sample of [place] given to patient to use daily. If not improving in 1 week patient will advise and depending on location of persistent involvement, will adjust therapy.   Lentigines - Scattered tan macules - Due to sun exposure - Benign-appearing, observe - Recommend daily broad spectrum sunscreen SPF 30+ to sun-exposed areas, reapply every 2 hours as needed. - Call for any changes  Seborrheic Keratoses - Stuck-on, waxy, tan-brown papules and/or plaques  - Benign-appearing - Discussed benign etiology and prognosis. - Observe - Call for any changes  Melanocytic Nevi - Tan-brown and/or pink-flesh-colored symmetric macules and papules - Benign appearing on exam today - Observation - Call clinic for new  or changing moles - Recommend daily use of broad spectrum spf 30+ sunscreen to sun-exposed areas.   Hemangiomas - Red papules - Discussed benign nature - Observe - Call for any  changes  Actinic Damage - Chronic condition, secondary to cumulative UV/sun exposure - diffuse scaly erythematous macules with underlying dyspigmentation - Recommend daily broad spectrum sunscreen SPF 30+ to sun-exposed areas, reapply every 2 hours as needed.  - Staying in the shade or wearing long sleeves, sun glasses (UVA+UVB protection) and wide brim hats (4-inch brim around the entire circumference of the hat) are also recommended for sun protection.  - Call for new or changing lesions.  Skin cancer screening performed today.  History of Squamous Cell Carcinoma in Situ of the Skin - No evidence of recurrence today - Recommend regular full body skin exams - Recommend daily broad spectrum sunscreen SPF 30+ to sun-exposed areas, reapply every 2 hours as needed.  - Call if any new or changing lesions are noted between office visits   Acrochordons (Skin Tags) - Removal desired by patient - Fleshy, skin-colored pedunculated papules - Benign appearing.  - Patient desires removal. Reviewed that this is not covered by insurance and they will be charged a cosmetic fee for removal. Patient signed non-covered consent.  - Prior to the procedure, reviewed the expected small wound. Also reviewed the risk of leaving a small scar and the small risk of infection.  PROCEDURE - The areas were prepped with isopropyl alcohol. A small amount of lidocaine 1% with epinephrine was injected at the base of each lesion to achieve good local anesthesia. The skin tags were removed using a snip technique. Aluminum chloride was used for hemostasis. Petrolatum and a bandage were applied. The procedure was tolerated well. - Wound care was reviewed with the patient. They were advised to call with any concerns. Total number of treated acrochordons 3 at right neck.  Return in about 6 months (around 09/14/2022) for TBSE.  Graciella Belton, RMA, am acting as scribe for Forest Gleason, MD .   Documentation: I have reviewed  the above documentation for accuracy and completeness, and I agree with the above.  Forest Gleason, MD

## 2022-03-19 ENCOUNTER — Encounter: Payer: Self-pay | Admitting: Dermatology

## 2022-04-30 ENCOUNTER — Ambulatory Visit: Payer: Medicare Other | Attending: Physician Assistant | Admitting: Physician Assistant

## 2022-04-30 ENCOUNTER — Encounter: Payer: Self-pay | Admitting: Physician Assistant

## 2022-04-30 VITALS — BP 150/90 | HR 80 | Ht 59.5 in | Wt 118.0 lb

## 2022-04-30 DIAGNOSIS — I2510 Atherosclerotic heart disease of native coronary artery without angina pectoris: Secondary | ICD-10-CM | POA: Diagnosis not present

## 2022-04-30 DIAGNOSIS — I471 Supraventricular tachycardia: Secondary | ICD-10-CM | POA: Diagnosis not present

## 2022-04-30 DIAGNOSIS — R002 Palpitations: Secondary | ICD-10-CM | POA: Insufficient documentation

## 2022-04-30 DIAGNOSIS — I358 Other nonrheumatic aortic valve disorders: Secondary | ICD-10-CM | POA: Diagnosis not present

## 2022-04-30 DIAGNOSIS — E785 Hyperlipidemia, unspecified: Secondary | ICD-10-CM

## 2022-04-30 NOTE — Patient Instructions (Signed)
Medication Instructions:  Your physician recommends that you continue on your current medications as directed. Please refer to the Current Medication list given to you today.  *If you need a refill on your cardiac medications before your next appointment, please call your pharmacy*   Lab Work: None  If you have labs (blood work) drawn today and your tests are completely normal, you will receive your results only by: [facility] (if you have MyChart) OR A paper copy in the mail If you have any lab test that is abnormal or we need to change your treatment, we will call you to review the results.   Testing/Procedures: None   Follow-Up: At [facility], you and your health needs are our priority.  As part of our continuing mission to provide you with exceptional heart care, we have created designated Provider Care Teams.  These Care Teams include your primary Cardiologist (physician) and Advanced Practice Providers (APPs -  Physician Assistants and Nurse Practitioners) who all work together to provide you with the care you need, when you need it.   Your next appointment:   1 year(s)  The format for your next appointment:   In Person  Provider:   Kathlyn Sacramento, MD or Christell Faith, PA-C      Important Information About Sugar

## 2022-04-30 NOTE — Progress Notes (Signed)
Cardiology Office Note    Date:  04/30/2022   ID:  Cynthia Collins, DOB 1946/02/09, MRN [id number]  PCP:  Pleas Koch, NP  Cardiologist:  Kathlyn Sacramento, MD  Electrophysiologist:  None   Chief Complaint: Follow-up  History of Present Illness:   Cynthia Collins is a 76 y.o. female with history of paroxysmal SVT, severe hyperlipidemia, anxiety with whitecoat syndrome and underlying panic attack disorder, and family history of CAD who presents for follow up of paroxysmal SVT.   Prior echo in 10/2019 for evaluation of murmur showed an EF of 55 to 60%, no regional wall motion abnormalities, grade 1 diastolic dysfunction, normal RV systolic function and ventricular cavity size, normal PASP, mild aortic valve sclerosis without evidence of stenosis, and an estimated right atrial pressure of 3 mmHg.  Murmur noted was likely in the setting of mild aortic valve sclerosis.  She was referred to Dr. Fletcher Anon for evaluation of cardiovascular disease given severe hyperlipidemia with an LDL of greater than 200 historically.  She has chronic generalized myalgias and arthralgias with prior refusal of statin therapy.  Prior rheumatology work-up unrevealing.  Coronary artery calcium score in 12/2019 of 1118 which was the 96 percentile.  At baseline, she was active walking approximately 4 miles per day without exertional symptoms and worked out with [employer].  Lexiscan MPI in 01/2020 showed a moderate in size, severe, fixed defect involving the inferior septal wall most consistent with scar though could not rule out artifact without significant ischemia being identified.  EF greater than 65%.  Attenuation corrected CT images demonstrated coronary artery calcification and aortic atherosclerosis.  Overall, this was an abnormal, probably low risk nuclear stress test.   With regards to her hyperlipidemia, she has not been interested in trying a statin and has documented intolerance to red yeast rice and Zetia.  She has  been evaluated by the lipid clinic with recommendation for PCSK9 inhibitor.  She preferred to research this prior to initiating therapy and when she was seen in 01/2021, she indicated she did not want to move forward with PCSK9 inhibitor.   She was seen in 01/2021 noting a 2 to 3-week history of intermittent randomly occurring palpitations that would occur approximately 3 times per day and last for several seconds, followed by spontaneous resolution.  There were no associated symptoms.  She did wonder if some recent holistic diet changes were contributing to her symptoms.  Zio patch in 02/2021 showed a predominant rhythm of sinus with an average heart rate of 70 bpm (range 42 to 136 bpm), 2 runs of SVT with the fastest and longest interval lasting just 8 beats, and rare PACs and PVCs.  Patient triggered events did not correlate with arrhythmia.  She was last seen in the office in 03/2021 and was without symptoms of angina or decompensation.  She reported 1 episode of tachypalpitations occurring at the dentist office noted with increased stress/anxiety at that time.  Blood pressure was well controlled.  She comes in doing well from a cardiac perspective and is without symptoms of angina or decompensation.  No palpitations, dizziness, presyncope, or syncope.  She remains quite active at baseline walking approximately 3 miles per day.  This has decreased from prior secondary to the summer heat.  She also continues to lift weights and is still able to do a head stand and back bend.  She continues to eat a mindful diet and prefers to avoid prescription medications.  No significant lower extremity swelling.  Overall,  she feels like she is doing quite well and does not have any cardiac issues or concerns at this time.   Labs independently reviewed: 08/2021 - magnesium 2.0, TSH normal Hgb 13.4, PLT 224, potassium 4.0, BUN 16, serum creatinine 0.75, albumin 4.3, AST/ALT normal, A1c 6.1, TC 268, TG 85, HDL 69, LDL  181  Past Medical History:  Diagnosis Date   Allergy    Heart murmur    Hyperlipidemia    Prediabetes    Squamous cell carcinoma of skin 08/03/2021   Mid chest - [facility] ED&C 09/07/21    Past Surgical History:  Procedure Laterality Date   APPENDECTOMY  08/20/1968   CESAREAN SECTION  08/20/1984   OOPHORECTOMY Right 08/20/1968   SKIN CANCER EXCISION      Current Medications: Current Meds  Medication Sig   ALPRAZolam (XANAX) 0.25 MG tablet Take 1 tablet by mouth daily as needed for anxiety. Use sparingly.   Ascorbic Acid (VITAMIN C) 1000 MG tablet Take 1,000 mg by mouth daily.   Coenzyme Q10 (CO Q 10 PO) Take by mouth. Three times weekly.   ELDERBERRY PO Take by mouth daily as needed.   GARLIC PO Take by mouth daily.   Hyaluronic Acid-Vitamin C (HYALURONIC ACID PO) Take 100 mg by mouth daily.   Menaquinone-7 (VITAMIN K2 PO) Take by mouth daily.   Misc Natural Products (LUTEIN 20 PO) Take 20 mg by mouth daily.   Misc Natural Products (WHITE WILLOW BARK PO) Take by mouth. Patient states she takes this for pain PRN   Multiple Vitamin (MULTIVITAMIN) tablet Take 1 tablet by mouth in the morning and at bedtime.   NATTOKINASE PO Take 1 capsule by mouth daily. Qd.   NON FORMULARY Take 1 capsule by mouth daily. Hawthorn Supreme   NON FORMULARY Bone Up 1 capsule 3 times a week   NON FORMULARY BCQ 3 days a week.   NON FORMULARY Collagen Powder Takes 1 scoop daily.   Omega-3 Fatty Acids (FISH OIL PO) Take by mouth. 4 times a week.   RESVERATROL PO Take 1 capsule by mouth 3 (three) times a week.   TURMERIC CURCUMIN PO Take 1 capsule by mouth daily.   VITAMIN D, CHOLECALCIFEROL, PO Take by mouth daily.   Whey Protein POWD Take 1 Scoop by mouth daily.   Zinc 50 MG CAPS Take 50 mg by mouth as needed.    Allergies:   Red yeast rice extract, Statins, Zetia [ezetimibe], and Zoloft [sertraline]   Social History   Socioeconomic History   Marital status: Divorced    Spouse name: Not on file    Number of children: Not on file   Years of education: Not on file   Highest education level: Not on file  Occupational History   Not on file  Tobacco Use   Smoking status: Never   Smokeless tobacco: Never  Vaping Use   Vaping Use: Never used  Substance and Sexual Activity   Alcohol use: Not Currently    Alcohol/week: 1.0 standard drink of alcohol    Types: 1 Cans of beer per week    Comment: Once a month    Drug use: Never   Sexual activity: Not on file  Other Topics Concern   Not on file  Social History Narrative   Not on file   Social Determinants of Health   Financial Resource Strain: Low Risk  (01/26/2022)   Overall Financial Resource Strain (CARDIA)    Difficulty of Paying Living Expenses: Not  hard at all  Food Insecurity: No Food Insecurity (01/26/2022)   Hunger Vital Sign    Worried About Running Out of Food in the Last Year: Never true    Ran Out of Food in the Last Year: Never true  Transportation Needs: No Transportation Needs (01/26/2022)   PRAPARE - Hydrologist (Medical): No    Lack of Transportation (Non-Medical): No  Physical Activity: Sufficiently Active (01/26/2022)   Exercise Vital Sign    Days of Exercise per Week: 7 days    Minutes of Exercise per Session: 90 min  Stress: Stress Concern Present (01/26/2022)   [place]    Feeling of Stress : To some extent  Social Connections: Not on file     Family History:  The patient's family history includes Alcohol abuse in her mother and sister; Arthritis in her mother; Diabetes in her maternal grandfather; Berenice Primas' disease in her sister; Hearing loss in her father; Heart disease in her father, maternal grandmother, mother, and paternal grandmother; Hypertension in her mother; Hypothyroidism in her mother; Lung cancer in her sister; Mental illness in her mother; Stroke in her father and mother. There is no history of Breast  cancer.  ROS:   12-point review of systems is negative unless otherwise noted in the HPI.   EKGs/Labs/Other Studies Reviewed:    Studies reviewed were summarized above. The additional studies were reviewed today:  Lexiscan MPI 01/2020: Abnormal, probably low risk exercise myocardial perfusion stress test. There is a moderate in size, severe, fixed defect involving the inferoseptal wall most consistent with scar but cannot rule out artifact. No significant ischemia was identified. The left ventricular ejection fraction is normal (>65%). Attenuation correction CT demonstrates coronary artery calcification and aortic atherosclerosis. __________   Calcium score 12/2019: 1. Coronary calcium score of 1118 . This was 96th percentile for age and sex matched control. 2. High calcium score. Recommend guideline directed medical therapy and risk factor modification. __________   2D echo 10/2019: 1. Left ventricular ejection fraction, by estimation, is 55 to 60%. The  left ventricle has normal function. The left ventricle has no regional  wall motion abnormalities. Left ventricular diastolic parameters are  consistent with Grade I diastolic  dysfunction (impaired relaxation).   2. Right ventricular systolic function is normal. The right ventricular  size is normal. There is normal pulmonary artery systolic pressure.   3. The mitral valve is normal in structure. No evidence of mitral valve  regurgitation. No evidence of mitral stenosis.   4. The aortic valve is tricuspid. Aortic valve regurgitation is not  visualized. Mild aortic valve sclerosis is present, with no evidence of  aortic valve stenosis.   5. The inferior vena cava is normal in size with greater than 50%  respiratory variability, suggesting right atrial pressure of 3 mmHg.   EKG:  EKG is ordered today.  The EKG ordered today demonstrates NSR, 80 bpm, left axis deviation, poor R wave progression along the precordial leads, no  acute ST-T changes  Recent Labs: 03/08/2022: ALT 17; BUN 16; Creatinine, Ser 0.75; Hemoglobin 13.4; Magnesium 2.0; Platelets 224.0; Potassium 4.0; Sodium 141; TSH 1.12  Recent Lipid Panel    Component Value Date/Time   CHOL 268 (H) 03/08/2022 1105   CHOL 281 (H) 09/30/2020 0835   TRIG 85.0 03/08/2022 1105   HDL 69.40 03/08/2022 1105   HDL 78 09/30/2020 0835   CHOLHDL 4 03/08/2022 1105   VLDL  17.0 03/08/2022 1105   LDLCALC 181 (H) 03/08/2022 1105   LDLCALC 191 (H) 09/30/2020 0835    PHYSICAL EXAM:    VS:  BP (!) 150/90 (BP Location: Right Arm, Patient Position: Sitting, Cuff Size: Normal)   Pulse 80   Ht 4' 11.5" (1.511 m)   Wt 118 lb (53.5 kg)   BMI 23.43 kg/m   BMI: Body mass index is 23.43 kg/m.  Physical Exam Vitals reviewed.  Constitutional:      Appearance: She is well-developed.  HENT:     Head: Normocephalic and atraumatic.  Eyes:     General:        Right eye: No discharge.        Left eye: No discharge.  Neck:     Vascular: No JVD.  Cardiovascular:     Rate and Rhythm: Normal rate and regular rhythm.     Pulses:          Posterior tibial pulses are 2+ on the right side and 2+ on the left side.     Heart sounds: S1 normal and S2 normal. Heart sounds not distant. No midsystolic click and no opening snap. Murmur heard.     Systolic murmur is present with a grade of 1/6 at the upper right sternal border.     No friction rub.  Pulmonary:     Effort: Pulmonary effort is normal. No respiratory distress.     Breath sounds: Normal breath sounds. No decreased breath sounds, wheezing or rales.  Chest:     Chest wall: No tenderness.  Abdominal:     General: There is no distension.  Musculoskeletal:     Cervical back: Normal range of motion.     Right lower leg: No edema.     Left lower leg: No edema.  Skin:    General: Skin is warm and dry.     Nails: There is no clubbing.  Neurological:     Mental Status: She is alert and oriented to person, place, and time.   Psychiatric:        Speech: Speech normal.        Behavior: Behavior normal.        Thought Content: Thought content normal.        Judgment: Judgment normal.     Wt Readings from Last 3 Encounters:  04/30/22 118 lb (53.5 kg)  03/08/22 116 lb 6 oz (52.8 kg)  01/26/22 116 lb 9.6 oz (52.9 kg)     ASSESSMENT & PLAN:   Palpitations/paroxysmal SVT: Quiescent.  Prior outpatient cardiac monitoring showed no evidence of significant sustained arrhythmia.  Not requiring AV nodal blocking medication.  She prefers to avoid all prescription medications.  CAD involving the native coronary arteries without angina with associated severe hyperlipidemia: She is doing very well without any symptoms concerning for angina or decompensation.  She remains very active without cardiac limitation.  She prefers to defer/avoid prescription medications.  Continue heart healthy diet and regular exercise.  No indication for further ischemic testing at this time.  Aortic valve sclerosis: No evidence of stenosis on recent echo.  Elevated blood pressure: Patient with a history of whitecoat hypertension.  Home blood pressure readings are very well controlled.   Disposition: F/u with Dr. Fletcher Anon or an APP in 12 months.   Medication Adjustments/Labs and Tests Ordered: Current medicines are reviewed at length with the patient today.  Concerns regarding medicines are outlined above. Medication changes, Labs and Tests ordered today are summarized  above and listed in the Patient Instructions accessible in Encounters.   Signed, Christell Faith, PA-C 04/30/2022 2:30 PM     [address] [place], [place] [address] [phone]

## 2022-08-08 ENCOUNTER — Encounter: Payer: Medicare Other | Admitting: Dermatology

## 2022-09-01 ENCOUNTER — Emergency Department: Payer: Medicare Other

## 2022-09-01 ENCOUNTER — Other Ambulatory Visit: Payer: Self-pay

## 2022-09-01 ENCOUNTER — Emergency Department
Admission: EM | Admit: 2022-09-01 | Discharge: 2022-09-02 | Disposition: A | Discharge status: Home / Self Care | Payer: Medicare Other | Attending: Emergency Medicine | Admitting: Emergency Medicine

## 2022-09-01 DIAGNOSIS — S4991XA Unspecified injury of right shoulder and upper arm, initial encounter: Secondary | ICD-10-CM | POA: Diagnosis present

## 2022-09-01 DIAGNOSIS — W19XXXA Unspecified fall, initial encounter: Secondary | ICD-10-CM | POA: Diagnosis not present

## 2022-09-01 DIAGNOSIS — S42431A Displaced fracture (avulsion) of lateral epicondyle of right humerus, initial encounter for closed fracture: Secondary | ICD-10-CM | POA: Diagnosis not present

## 2022-09-01 MED ORDER — OXYCODONE-ACETAMINOPHEN 5-325 MG PO TABS: 1.0000 | ORAL_TABLET | Freq: Four times a day (QID) | ORAL | 0 refills | Status: AC | PRN

## 2022-09-01 MED ORDER — ONDANSETRON 4 MG PO TBDP: 4.0000 mg | ORAL_TABLET | Freq: Three times a day (TID) | ORAL | 0 refills | Status: AC | PRN

## 2022-09-01 NOTE — Discharge Instructions (Signed)
You can take Percocet for pain. Please make follow-up appointment with orthopedics.

## 2022-09-01 NOTE — ED Triage Notes (Signed)
Pt to Ed from home via car. Pt stumped her toe and fell. She landed on her right side. Son came to her house and put her arm in a sling. Pt having pain in her right elbow and right shoulder. Pt states "its like muscle spasms". Pt has placed  ice on it.

## 2022-09-02 DIAGNOSIS — S42431A Displaced fracture (avulsion) of lateral epicondyle of right humerus, initial encounter for closed fracture: Secondary | ICD-10-CM | POA: Diagnosis not present

## 2022-09-02 MED ORDER — ONDANSETRON 4 MG PO TBDP
4.0000 mg | ORAL_TABLET | Freq: Once | ORAL | Status: AC
  Administered 2022-09-02: 4 mg via ORAL
  Filled 2022-09-02: qty 1

## 2022-09-02 MED ORDER — OXYCODONE-ACETAMINOPHEN 5-325 MG PO TABS
1.0000 | ORAL_TABLET | Freq: Once | ORAL | Status: AC
  Administered 2022-09-02: 1 via ORAL
  Filled 2022-09-02: qty 1

## 2022-09-02 NOTE — ED Provider Notes (Signed)
[facility] Provider Note  Patient Contact: 12:05 AM (approximate)   History   Arm Injury (right)   HPI  Cynthia Collins is a 77 y.o. female presents to the emergency department with right elbow pain after mechanical fall.  Patient localizes pain to right elbow and right shoulder.  Patient states that she stepped her toe along a raised bed which caused her fall.  No chest pain, chest tightness or shortness of breath.  Patient has been able to ambulate easily since fall occurred.      Physical Exam   Triage Vital Signs: ED Triage Vitals [09/01/22 2212]  Enc Vitals Group     BP (!) 154/69     Pulse Rate 75     Resp 16     Temp 98.3 F (36.8 C)     Temp Source Oral     SpO2 98 %     Weight 116 lb (52.6 kg)     Height '4\' 11"'$  (1.499 m)     Head Circumference      Peak Flow      Pain Score 10     Pain Loc      Pain Edu?      Excl. in [place]?     Most recent vital signs: Vitals:   09/01/22 2212  BP: (!) 154/69  Pulse: 75  Resp: 16  Temp: 98.3 F (36.8 C)  SpO2: 98%     General: Alert and in no acute distress. Eyes:  PERRL. EOMI. Head: No acute traumatic findings      Nose: No congestion/rhinnorhea.      Mouth/Throat: Mucous membranes are moist.  Neck: No stridor. No cervical spine tenderness to palpation. Cardiovascular:  Good peripheral perfusion Respiratory: Normal respiratory effort without tachypnea or retractions. Lungs CTAB. Good air entry to the bases with no decreased or absent breath sounds. Gastrointestinal: Bowel sounds 4 quadrants. Soft and nontender to palpation. No guarding or rigidity. No palpable masses. No distention. No CVA tenderness. Musculoskeletal: Full range of motion to all extremities.  Patient performs limited range of motion at right elbow.  Palpable radial and ulnar pulses bilaterally and symmetrically. Neurologic:  No gross focal neurologic deficits are appreciated.  Skin:   No rash noted Other:   ED  Results / Procedures / Treatments   Labs (all labs ordered are listed, but only abnormal results are displayed) Labs Reviewed - No data to display    RADIOLOGY  I personally viewed and evaluated these images as part of my medical decision making, as well as reviewing the written report by the radiologist.  ED Provider Interpretation: Patient has displaced lateral epicondyle fracture.   PROCEDURES:  Critical Care performed: No  Procedures   MEDICATIONS ORDERED IN ED: Medications - No data to display   IMPRESSION / MDM / [place] / ED COURSE  I reviewed the triage vital signs and the nursing notes.                              Assessment and plan Elbow pain 77 year old female presents to the emergency department with right elbow pain after a mechanical fall.  X-ray indicates a lateral epicondyle fracture.  Patient was placed in a posterior long-arm splint at 90 degrees in a position of comfort and she was given Percocet for pain.  She was discharged with a short course of Percocet for pain.  Patient was referred to  orthopedics, Dr. Sharlet Salina.  Return precautions were given to return with new or worsening symptoms.      FINAL CLINICAL IMPRESSION(S) / ED DIAGNOSES   Final diagnoses:  Displaced fracture (avulsion) of lateral epicondyle of right humerus, initial encounter for closed fracture     Rx / DC Orders   ED Discharge Orders          Ordered    oxyCODONE-acetaminophen (PERCOCET/ROXICET) 5-325 MG tablet  Every 6 hours PRN        09/01/22 2353    ondansetron (ZOFRAN-ODT) 4 MG disintegrating tablet  Every 8 hours PRN        09/01/22 2353             Note:  This document was prepared using Dragon voice recognition software and may include unintentional dictation errors.   Vallarie Mare [place], PA-C 09/02/22 0007    Harvest Dark, MD 09/02/22 2218

## 2022-09-05 ENCOUNTER — Telehealth: Payer: Self-pay

## 2022-09-05 NOTE — Patient Outreach (Signed)
  Care Coordination TOC Note Transition Care Management Unsuccessful Follow-up Telephone Call  Date of discharge and from where:  09/02/22-[facility] ED  Attempts:  1st Attempt  Reason for unsuccessful TCM follow-up call:  Left voice message     Enzo Montgomery, RN,BSN,CCM [place] [facility] Telephonic Care Management Coordinator Direct Phone: [phone] Toll Free: [phone] Fax: [phone]

## 2022-09-05 NOTE — Patient Outreach (Signed)
  Care Coordination TOC Note Transition Care Management Follow-up Telephone Call Date of discharge and from where: 09/02/22-[facility] ED   Dx: "displaced fracture of right humerus" Red on EMMI-ED Discharge Alert Reason: "Scheduled follow-up appt? No" Red Alert Date: 09/03/22 How have you been since you were released from the hospital? Return call from patient. She is in good spirits and reports things going good., She has not had any pain. She has only taken Ibuprofen-only prior to going to bed. She voices that she just got back from ortho appt. She has been told that she will probably have to have surgery. She goes back for further eval and workup on next Tues. She was able to get arm placed into a shorter splint which has allowed her a little more flexibility and movement but admits that since its her dominant arm she has needed to have assistance a lot.  Any questions or concerns? No  Items Reviewed: Did the pt receive and understand the discharge instructions provided? Yes  Medications obtained and verified? Yes  Other? No  Any new allergies since your discharge? No  Dietary orders reviewed? No Do you have support at home? Yes -Son and daughter in law  [place] and Equipment/Supplies: Were home health services ordered? not applicable If so, what is the name of the agency? N/A  Has the agency set up a time to come to the patient's home? not applicable Were any new equipment or medical supplies ordered?  No What is the name of the medical supply agency? N/A Were you able to get the supplies/equipment? not applicable Do you have any questions related to the use of the equipment or supplies? No  Functional Questionnaire: (I = Independent and D = Dependent) ADLs: A  Bathing/Dressing- A  Meal Prep- A  Eating- I  Maintaining continence- I  Transferring/Ambulation- I  Managing Meds- A  Follow up appointments reviewed:  PCP Hospital f/u appt confirmed? No   Specialist Hospital f/u  appt confirmed? Yes  -Patient saw ortho MD today-goes back next week Are transportation arrangements needed? No  If their condition worsens, is the pt aware to call PCP or go to the Emergency Dept.? Yes Was the patient provided with contact information for the PCP's office or ED? Yes Was to pt encouraged to call back with questions or concerns? Yes  SDOH assessments and interventions completed:   Yes SDOH Interventions Today    Flowsheet Row Most Recent Value  SDOH Interventions   Food Insecurity Interventions Intervention Not Indicated  Transportation Interventions Intervention Not Indicated       Care Coordination Interventions:  Education provided    Encounter Outcome:  Pt. Visit Completed    Enzo Montgomery, RN,BSN,CCM [place] [facility] Telephonic Care Management Coordinator Direct Phone: [phone] Toll Free: [phone] Fax: [phone]

## 2022-09-07 ENCOUNTER — Other Ambulatory Visit: Payer: Self-pay | Admitting: Specialist

## 2022-09-07 DIAGNOSIS — S42451A Displaced fracture of lateral condyle of right humerus, initial encounter for closed fracture: Secondary | ICD-10-CM

## 2022-09-11 ENCOUNTER — Ambulatory Visit
Admission: RE | Admit: 2022-09-11 | Discharge: 2022-09-11 | Disposition: A | Discharge status: Home / Self Care | Payer: Medicare Other | Source: Ambulatory Visit | Attending: Specialist | Admitting: Specialist

## 2022-09-11 DIAGNOSIS — S42451A Displaced fracture of lateral condyle of right humerus, initial encounter for closed fracture: Secondary | ICD-10-CM | POA: Diagnosis present

## 2022-09-12 ENCOUNTER — Encounter: Payer: Medicare Other | Admitting: Dermatology

## 2022-09-19 ENCOUNTER — Other Ambulatory Visit: Payer: Self-pay

## 2022-09-19 ENCOUNTER — Encounter (HOSPITAL_COMMUNITY): Payer: Self-pay | Admitting: Orthopedic Surgery

## 2022-09-19 NOTE — Progress Notes (Signed)
PCP - Alma Friendly, NP Cardiologist - Kathlyn Sacramento, MD  PPM/ICD - denies  EKG - 04/30/22 Stress Test - 01/23/20 ECHO - 11/18/19 Cardiac Cath - denies  CPAP - n/a  Fasting Blood Sugar - n/a  Blood Thinner Instructions: n/a Patient was instructed: As of today, STOP taking any Aspirin (unless otherwise instructed by your surgeon) Aleve, Naproxen, Ibuprofen, Motrin, Advil, Goody's, BC's, all herbal medications, fish oil, and all vitamins.  ERAS Protcol - yes, until 05:00 o'clock  COVID TEST- n/a  Anesthesia review: yes - history of paroxysmal SVT, heart murmur  Patient verbally denies any shortness of breath, fever, cough and chest pain during phone call   -------------  SDW INSTRUCTIONS given:  Your procedure is scheduled on Thursday, February 1st, 2024.  Report to [facility] Main Entrance "A" at 05:30 A.M., and check in at the Admitting office.  Call this number if you have problems the morning of surgery:  [phone]   Remember:  Do not eat after midnight the night before your surgery  You may drink clear liquids until 05:00 the morning of your surgery.   Clear liquids allowed are: Water, Non-Citrus Juices (without pulp), Carbonated Beverages, Clear Tea, Black Coffee Only, and Gatorade    Take these medicines the morning of surgery with A SIP OF WATER: Xanax - PRN   The day of surgery:                      Do not wear jewelry, make up, or nail polish            Do not wear lotions, powders, perfumes, or deodorant.            Do not shave 48 hours prior to surgery.              Do not bring valuables to the hospital.            [facility] is not responsible for any belongings or valuables.  Do NOT Smoke (Tobacco/Vaping) 24 hours prior to your procedure If you use a CPAP at night, you may bring all equipment for your overnight stay.   Contacts, glasses, dentures or bridgework may not be worn into surgery.      For patients admitted to the hospital,  discharge time will be determined by your treatment team.   Patients discharged the day of surgery will not be allowed to drive home, and someone needs to stay with them for 24 hours.    Special instructions:   [place]- Preparing For Surgery  Before surgery, you can play an important role. Because skin is not sterile, your skin needs to be as free of germs as possible. You can reduce the number of germs on your skin by washing with CHG (chlorahexidine gluconate) Soap before surgery.  CHG is an antiseptic cleaner which kills germs and bonds with the skin to continue killing germs even after washing.    Oral Hygiene is also important to reduce your risk of infection.  Remember - BRUSH YOUR TEETH THE MORNING OF SURGERY WITH YOUR REGULAR TOOTHPASTE  Please do not use if you have an allergy to CHG or antibacterial soaps. If your skin becomes reddened/irritated stop using the CHG.  Do not shave (including legs and underarms) for at least 48 hours prior to first CHG shower. It is OK to shave your face.  Please follow these instructions carefully.   Shower the NIGHT BEFORE SURGERY and the MORNING OF  SURGERY with DIAL Soap.   Pat yourself dry with a CLEAN TOWEL.  Wear CLEAN PAJAMAS to bed the night before surgery  Place CLEAN SHEETS on your bed the night of your first shower and DO NOT SLEEP WITH PETS.   Day of Surgery: Please shower morning of surgery  Wear Clean/Comfortable clothing the morning of surgery Do not apply any deodorants/lotions.   Remember to brush your teeth WITH YOUR REGULAR TOOTHPASTE.   Questions were answered. Patient verbalized understanding of instructions.

## 2022-09-19 NOTE — Anesthesia Preprocedure Evaluation (Addendum)
Anesthesia Evaluation  Patient identified by MRN, date of birth, ID band Patient awake    Reviewed: Allergy & Precautions, NPO status , Patient's Chart, lab work & pertinent test results  History of Anesthesia Complications (+) PROLONGED EMERGENCE and history of anesthetic complications  Airway Mallampati: III  TM Distance: >3 FB Neck ROM: Full    Dental  (+) Dental Advisory Given   Pulmonary neg shortness of breath, asthma , neg sleep apnea, neg COPD, neg recent URI   Pulmonary exam normal breath sounds clear to auscultation       Cardiovascular (-) angina + CAD  (-) Past MI and (-) Cardiac Stents + dysrhythmias Supra Ventricular Tachycardia + Valvular Problems/Murmurs  Rhythm:Regular Rate:Normal  HLD  Stress Test 02/02/2020:  Abnormal, probably low risk exercise myocardial perfusion stress test.  There is a moderate in size, severe, fixed defect involving the inferoseptal wall most consistent with scar but cannot rule out artifact.  No significant ischemia was identified.  The left vientricular ejection fraction is normal (>65%).  Attenuation correction CT demonstrates coronary artery calcification and aortic atherosclerosis.  CT Cardiac Scoring 12/28/2019: IMPRESSION: 1. Coronary calcium score of 1118 . This was 96th percentile for age and sex matched control.   2. High calcium score. Recommend guideline directed medical therapy and risk factor modification.  TTE 11/18/2019: IMPRESSIONS     1. Left ventricular ejection fraction, by estimation, is 55 to 60%. The  left ventricle has normal function. The left ventricle has no regional  wall motion abnormalities. Left ventricular diastolic parameters are  consistent with Grade I diastolic  dysfunction (impaired relaxation).   2. Right ventricular systolic function is normal. The right ventricular  size is normal. There is normal pulmonary artery systolic pressure.    3. The mitral valve is normal in structure. No evidence of mitral valve  regurgitation. No evidence of mitral stenosis.   4. The aortic valve is tricuspid. Aortic valve regurgitation is not  visualized. Mild aortic valve sclerosis is present, with no evidence of  aortic valve stenosis.   5. The inferior vena cava is normal in size with greater than 50%  respiratory variability, suggesting right atrial pressure of 3 mmHg.      Neuro/Psych   Anxiety     negative neurological ROS     GI/Hepatic negative GI ROS, Neg liver ROS,,,  Endo/Other  Pre-diabetes  Renal/GU negative Renal ROS     Musculoskeletal osteoporosis   Abdominal   Peds  Hematology negative hematology ROS (+)   Anesthesia Other Findings   Reproductive/Obstetrics                             Anesthesia Physical Anesthesia Plan  ASA: 3  Anesthesia Plan: General and Regional   Post-op Pain Management: Regional block* and Tylenol PO (pre-op)*   Induction: Intravenous  PONV Risk Score and Plan: 3 and Ondansetron, Dexamethasone and Treatment may vary due to age or medical condition  Airway Management Planned: Oral ETT  Additional Equipment:   Intra-op Plan:   Post-operative Plan: Extubation in OR  Informed Consent: I have reviewed the patients History and Physical, chart, labs and discussed the procedure including the risks, benefits and alternatives for the proposed anesthesia with the patient or authorized representative who has indicated his/her understanding and acceptance.     Dental advisory given  Plan Discussed with: Anesthesiologist and CRNA  Anesthesia Plan Comments: (Discussed potential risks of nerve blocks including,  but not limited to, infection, bleeding, nerve damage, seizures, pneumothorax, respiratory depression, and potential failure of the block. Alternatives to nerve blocks discussed. All questions answered.  Risks of general anesthesia discussed  including, but not limited to, sore throat, hoarse voice, chipped/damaged teeth, injury to vocal cords, nausea and vomiting, allergic reactions, lung infection, heart attack, stroke, and death. All questions answered.   PAT note by Karoline Caldwell, PA-C:  History of severe hyperlipidemia, whitecoat syndrome, strong family history of CAD.  She had cardiology evaluation in 2021.  TTE 10/2019 showed EF 55 to 60%, grade 1 DD, normal wall motion, normal valves.  CT cardiac scoring showed calcium score of 1118, 96 percentile for age and sex matched controls.  Nuclear stress test 01/2020 showed moderate size, severe, fixed defect involving the inferoseptal wall most consistent with scar but cannot rule out artifact, no significant ischemia identified, felt probably low risk.  Event monitor 01/2021 showed predominant underlying rhythm to be sinus, rare PACs and PVCs, 2 short runs of SVT.  Laurann Montana, NP commented on stress test result on 02/03/2020 stating, "Reviewed stress test result via phone. Normal pumping function, no evidence of ischemia (blockage) Moderate sized defect involving inferoseptal wall consistent with scar unable to exclude artifact - echo 10/2019 with no regional wall motion abnormalities, EKG with no ST/T wave abnormalities. No indication for further ischemic evaluation at this time."  Pt will need DOS labs and eval.  EKG 04/30/2022: NSR.  Rate 80.  Possible LAE.  Left axis deviation.  Low voltage QRS.  Event monitor 02/15/2021: Patient had a min HR of 42 bpm, max HR of 136 bpm, and avg HR of 70 bpm. Predominant underlying rhythm was Sinus Rhythm.  2 Supraventricular Tachycardia runs occurred, the run with the fastest interval lasting 8 beats with a max rate of 136 bpm, the longest lasting 8 beats with an avg rate of 106 bpm.  Rare PACs and rare PVCs. Triggered events did not correlate with arrhythmia.  Stress test 02/02/2020: ? Abnormal, probably low risk pharmacologic myocardial perfusion  stress test. ? There is a moderate in size, severe, fixed defect involving the inferoseptal wall most consistent with scar but cannot rule out artifact. ? No significant ischemia was identified. ? The left ventricular ejection fraction is normal (>65%). ? Attenuation correction CT demonstrates coronary artery calcification and aortic atherosclerosis.  TTE 11/18/2019: 1. Left ventricular ejection fraction, by estimation, is 55 to 60%. The  left ventricle has normal function. The left ventricle has no regional  wall motion abnormalities. Left ventricular diastolic parameters are  consistent with Grade I diastolic  dysfunction (impaired relaxation).  2. Right ventricular systolic function is normal. The right ventricular  size is normal. There is normal pulmonary artery systolic pressure.  3. The mitral valve is normal in structure. No evidence of mitral valve  regurgitation. No evidence of mitral stenosis.  4. The aortic valve is tricuspid. Aortic valve regurgitation is not  visualized. Mild aortic valve sclerosis is present, with no evidence of  aortic valve stenosis.  5. The inferior vena cava is normal in size with greater than 50%  respiratory variability, suggesting right atrial pressure of 3 mmHg.    )        Anesthesia Quick Evaluation

## 2022-09-19 NOTE — Progress Notes (Signed)
Anesthesia Chart Review: Same day workup  History of severe hyperlipidemia, whitecoat syndrome, strong family history of CAD.  She had cardiology evaluation in 2021.  TTE 10/2019 showed EF 55 to 60%, grade 1 DD, normal wall motion, normal valves.  CT cardiac scoring showed calcium score of 1118, 96 percentile for age and sex matched controls.  Nuclear stress test 01/2020 showed moderate size, severe, fixed defect involving the inferoseptal wall most consistent with scar but cannot rule out artifact, no significant ischemia identified, felt probably low risk.  Event monitor 01/2021 showed predominant underlying rhythm to be sinus, rare PACs and PVCs, 2 short runs of SVT.  Laurann Montana, NP commented on stress test result on 02/03/2020 stating, "Reviewed stress test result via phone. Normal pumping function, no evidence of ischemia (blockage) Moderate sized defect involving inferoseptal wall consistent with scar unable to exclude artifact - echo 10/2019 with no regional wall motion abnormalities, EKG with no ST/T wave abnormalities. No indication for further ischemic evaluation at this time."  Pt will need DOS labs and eval.  EKG 04/30/2022: NSR.  Rate 80.  Possible LAE.  Left axis deviation.  Low voltage QRS.  Event monitor 02/15/2021: Patient had a min HR of 42 bpm, max HR of 136 bpm, and avg HR of 70 bpm. Predominant underlying rhythm was Sinus Rhythm.  2 Supraventricular Tachycardia runs occurred, the run with the fastest interval lasting 8 beats with a max rate of 136 bpm, the longest lasting 8 beats with an avg rate of 106 bpm.  Rare PACs and rare PVCs. Triggered events did not correlate with arrhythmia.  Stress test 02/02/2020: Abnormal, probably low risk pharmacologic myocardial perfusion stress test. There is a moderate in size, severe, fixed defect involving the inferoseptal wall most consistent with scar but cannot rule out artifact. No significant ischemia was identified. The left ventricular  ejection fraction is normal (>65%). Attenuation correction CT demonstrates coronary artery calcification and aortic atherosclerosis.  TTE 11/18/2019:  1. Left ventricular ejection fraction, by estimation, is 55 to 60%. The  left ventricle has normal function. The left ventricle has no regional  wall motion abnormalities. Left ventricular diastolic parameters are  consistent with Grade I diastolic  dysfunction (impaired relaxation).   2. Right ventricular systolic function is normal. The right ventricular  size is normal. There is normal pulmonary artery systolic pressure.   3. The mitral valve is normal in structure. No evidence of mitral valve  regurgitation. No evidence of mitral stenosis.   4. The aortic valve is tricuspid. Aortic valve regurgitation is not  visualized. Mild aortic valve sclerosis is present, with no evidence of  aortic valve stenosis.   5. The inferior vena cava is normal in size with greater than 50%  respiratory variability, suggesting right atrial pressure of 3 mmHg.     Wynonia Musty [facility] Short Stay Center/Anesthesiology Phone [phone] 09/19/2022 12:22 PM

## 2022-09-20 ENCOUNTER — Ambulatory Visit (HOSPITAL_BASED_OUTPATIENT_CLINIC_OR_DEPARTMENT_OTHER): Payer: Medicare Other | Admitting: Physician Assistant

## 2022-09-20 ENCOUNTER — Ambulatory Visit (HOSPITAL_COMMUNITY): Payer: Medicare Other

## 2022-09-20 ENCOUNTER — Encounter (HOSPITAL_COMMUNITY)
Admission: RE | Disposition: A | Discharge status: Home / Self Care | Payer: Self-pay | Source: Home / Self Care | Attending: Orthopedic Surgery

## 2022-09-20 ENCOUNTER — Other Ambulatory Visit: Payer: Self-pay

## 2022-09-20 ENCOUNTER — Ambulatory Visit (HOSPITAL_COMMUNITY): Payer: Medicare Other | Admitting: Physician Assistant

## 2022-09-20 ENCOUNTER — Ambulatory Visit (HOSPITAL_COMMUNITY)
Admission: RE | Admit: 2022-09-20 | Discharge: 2022-09-20 | Disposition: A | Discharge status: Home / Self Care | Payer: Medicare Other | Attending: Orthopedic Surgery | Admitting: Orthopedic Surgery

## 2022-09-20 ENCOUNTER — Encounter (HOSPITAL_COMMUNITY): Payer: Self-pay | Admitting: Orthopedic Surgery

## 2022-09-20 DIAGNOSIS — E785 Hyperlipidemia, unspecified: Secondary | ICD-10-CM | POA: Diagnosis not present

## 2022-09-20 DIAGNOSIS — S42411D Displaced simple supracondylar fracture without intercondylar fracture of right humerus, subsequent encounter for fracture with routine healing: Secondary | ICD-10-CM | POA: Diagnosis not present

## 2022-09-20 DIAGNOSIS — S42411A Displaced simple supracondylar fracture without intercondylar fracture of right humerus, initial encounter for closed fracture: Secondary | ICD-10-CM | POA: Insufficient documentation

## 2022-09-20 DIAGNOSIS — S52124A Nondisplaced fracture of head of right radius, initial encounter for closed fracture: Secondary | ICD-10-CM | POA: Diagnosis not present

## 2022-09-20 DIAGNOSIS — S53104A Unspecified dislocation of right ulnohumeral joint, initial encounter: Secondary | ICD-10-CM | POA: Insufficient documentation

## 2022-09-20 DIAGNOSIS — I2510 Atherosclerotic heart disease of native coronary artery without angina pectoris: Secondary | ICD-10-CM | POA: Diagnosis not present

## 2022-09-20 DIAGNOSIS — F419 Anxiety disorder, unspecified: Secondary | ICD-10-CM

## 2022-09-20 DIAGNOSIS — X58XXXA Exposure to other specified factors, initial encounter: Secondary | ICD-10-CM | POA: Insufficient documentation

## 2022-09-20 DIAGNOSIS — J45909 Unspecified asthma, uncomplicated: Secondary | ICD-10-CM

## 2022-09-20 HISTORY — DX: Anxiety disorder, unspecified: F41.9

## 2022-09-20 HISTORY — DX: Prediabetes: R73.03

## 2022-09-20 HISTORY — DX: Supraventricular tachycardia, unspecified: I47.10

## 2022-09-20 HISTORY — PX: ORIF HUMERUS FRACTURE: SHX2126

## 2022-09-20 HISTORY — DX: Other complications of anesthesia, initial encounter: T88.59XA

## 2022-09-20 HISTORY — DX: Unspecified asthma, uncomplicated: J45.909

## 2022-09-20 LAB — CBC
HCT: 40.9 % (ref 36.0–46.0)
Hemoglobin: 13.6 g/dL (ref 12.0–15.0)
MCH: 29.5 pg (ref 26.0–34.0)
MCHC: 33.3 g/dL (ref 30.0–36.0)
MCV: 88.7 fL (ref 80.0–100.0)
Platelets: 242 10*3/uL (ref 150–400)
RBC: 4.61 MIL/uL (ref 3.87–5.11)
RDW: 12.7 % (ref 11.5–15.5)
WBC: 6.8 10*3/uL (ref 4.0–10.5)
nRBC: 0 % (ref 0.0–0.2)

## 2022-09-20 LAB — BASIC METABOLIC PANEL
Anion gap: 10 (ref 5–15)
BUN: 12 mg/dL (ref 8–23)
CO2: 27 mmol/L (ref 22–32)
Calcium: 9.3 mg/dL (ref 8.9–10.3)
Chloride: 100 mmol/L (ref 98–111)
Creatinine, Ser: 0.84 mg/dL (ref 0.44–1.00)
GFR, Estimated: >60 mL/min (ref >60)
Glucose, Bld: 106 mg/dL — ABNORMAL HIGH (ref 70–99)
Potassium: 3.5 mmol/L (ref 3.5–5.1)
Sodium: 137 mmol/L (ref 135–145)

## 2022-09-20 SURGERY — OPEN REDUCTION INTERNAL FIXATION (ORIF) DISTAL HUMERUS FRACTURE
Anesthesia: Regional | Laterality: Right

## 2022-09-20 MED ORDER — PROPOFOL 10 MG/ML IV BOLUS
INTRAVENOUS | Status: DC | PRN
  Administered 2022-09-20: 130 mg via INTRAVENOUS

## 2022-09-20 MED ORDER — MIDAZOLAM HCL 2 MG/2ML IJ SOLN
INTRAMUSCULAR | Status: AC
  Filled 2022-09-20: qty 2

## 2022-09-20 MED ORDER — ORAL CARE MOUTH RINSE: 15.0000 mL | Freq: Once | OROMUCOSAL | Status: AC

## 2022-09-20 MED ORDER — EPHEDRINE SULFATE-NACL 50-0.9 MG/10ML-% IV SOSY
PREFILLED_SYRINGE | INTRAVENOUS | Status: DC | PRN
  Administered 2022-09-20: 10 mg via INTRAVENOUS
  Administered 2022-09-20 (×2): 5 mg via INTRAVENOUS

## 2022-09-20 MED ORDER — ONDANSETRON HCL 4 MG/2ML IJ SOLN
INTRAMUSCULAR | Status: DC | PRN
  Administered 2022-09-20: 4 mg via INTRAVENOUS

## 2022-09-20 MED ORDER — LIDOCAINE 2% (20 MG/ML) 5 ML SYRINGE
INTRAMUSCULAR | Status: DC | PRN
  Administered 2022-09-20: 60 mg via INTRAVENOUS

## 2022-09-20 MED ORDER — DEXAMETHASONE SODIUM PHOSPHATE 10 MG/ML IJ SOLN
INTRAMUSCULAR | Status: DC | PRN
  Administered 2022-09-20: 10 mg via INTRAVENOUS

## 2022-09-20 MED ORDER — OXYCODONE HCL 5 MG/5ML PO SOLN: 5.0000 mg | Freq: Once | ORAL | Status: DC | PRN

## 2022-09-20 MED ORDER — EPHEDRINE 5 MG/ML INJ
INTRAVENOUS | Status: AC
  Filled 2022-09-20: qty 5

## 2022-09-20 MED ORDER — STERILE WATER FOR IRRIGATION IR SOLN
Status: DC | PRN
  Administered 2022-09-20: 1000 mL

## 2022-09-20 MED ORDER — OXYCODONE HCL 5 MG PO TABS: 5.0000 mg | ORAL_TABLET | Freq: Once | ORAL | Status: DC | PRN

## 2022-09-20 MED ORDER — LACTATED RINGERS IV SOLN: INTRAVENOUS | Status: DC

## 2022-09-20 MED ORDER — PHENYLEPHRINE HCL-NACL 20-0.9 MG/250ML-% IV SOLN
INTRAVENOUS | Status: DC | PRN
  Administered 2022-09-20: 25 ug/min via INTRAVENOUS

## 2022-09-20 MED ORDER — ROCURONIUM BROMIDE 10 MG/ML (PF) SYRINGE
PREFILLED_SYRINGE | INTRAVENOUS | Status: AC
  Filled 2022-09-20: qty 10

## 2022-09-20 MED ORDER — FENTANYL CITRATE (PF) 100 MCG/2ML IJ SOLN
INTRAMUSCULAR | Status: AC
  Administered 2022-09-20: 100 ug
  Filled 2022-09-20: qty 2

## 2022-09-20 MED ORDER — FENTANYL CITRATE (PF) 100 MCG/2ML IJ SOLN: 25.0000 ug | INTRAMUSCULAR | Status: DC | PRN

## 2022-09-20 MED ORDER — AMISULPRIDE (ANTIEMETIC) 5 MG/2ML IV SOLN: 10.0000 mg | Freq: Once | INTRAVENOUS | Status: DC | PRN

## 2022-09-20 MED ORDER — 0.9 % SODIUM CHLORIDE (POUR BTL) OPTIME
TOPICAL | Status: DC | PRN
  Administered 2022-09-20: 1000 mL

## 2022-09-20 MED ORDER — DEXAMETHASONE SODIUM PHOSPHATE 10 MG/ML IJ SOLN
INTRAMUSCULAR | Status: AC
  Filled 2022-09-20: qty 1

## 2022-09-20 MED ORDER — FENTANYL CITRATE PF 50 MCG/ML IJ SOSY: 50.0000 ug | PREFILLED_SYRINGE | Freq: Once | INTRAMUSCULAR | Status: DC

## 2022-09-20 MED ORDER — CEFAZOLIN SODIUM-DEXTROSE 2-4 GM/100ML-% IV SOLN
2.0000 g | Freq: Once | INTRAVENOUS | Status: AC
  Administered 2022-09-20: 2 g via INTRAVENOUS
  Filled 2022-09-20: qty 100

## 2022-09-20 MED ORDER — ROCURONIUM BROMIDE 10 MG/ML (PF) SYRINGE
PREFILLED_SYRINGE | INTRAVENOUS | Status: DC | PRN
  Administered 2022-09-20: 30 mg via INTRAVENOUS
  Administered 2022-09-20: 20 mg via INTRAVENOUS

## 2022-09-20 MED ORDER — GLYCOPYRROLATE 0.2 MG/ML IJ SOLN
INTRAMUSCULAR | Status: DC | PRN
  Administered 2022-09-20: .2 mg via INTRAVENOUS

## 2022-09-20 MED ORDER — ONDANSETRON HCL 4 MG/2ML IJ SOLN
INTRAMUSCULAR | Status: AC
  Filled 2022-09-20: qty 2

## 2022-09-20 MED ORDER — CHLORHEXIDINE GLUCONATE 0.12 % MT SOLN: 15.0000 mL | Freq: Once | OROMUCOSAL | Status: AC

## 2022-09-20 MED ORDER — PROPOFOL 10 MG/ML IV BOLUS
INTRAVENOUS | Status: AC
  Filled 2022-09-20: qty 20

## 2022-09-20 MED ORDER — FENTANYL CITRATE (PF) 250 MCG/5ML IJ SOLN
INTRAMUSCULAR | Status: DC | PRN
  Administered 2022-09-20: 50 ug via INTRAVENOUS

## 2022-09-20 MED ORDER — FENTANYL CITRATE (PF) 250 MCG/5ML IJ SOLN
INTRAMUSCULAR | Status: AC
  Filled 2022-09-20: qty 5

## 2022-09-20 MED ORDER — CHLORHEXIDINE GLUCONATE 0.12 % MT SOLN
OROMUCOSAL | Status: AC
  Administered 2022-09-20: 15 mL via OROMUCOSAL
  Filled 2022-09-20: qty 15

## 2022-09-20 MED ORDER — ACETAMINOPHEN 500 MG PO TABS: 1000.0000 mg | ORAL_TABLET | Freq: Once | ORAL | Status: DC

## 2022-09-20 MED ORDER — GLYCOPYRROLATE PF 0.2 MG/ML IJ SOSY
PREFILLED_SYRINGE | INTRAMUSCULAR | Status: AC
  Filled 2022-09-20: qty 1

## 2022-09-20 MED ORDER — LIDOCAINE 2% (20 MG/ML) 5 ML SYRINGE
INTRAMUSCULAR | Status: AC
  Filled 2022-09-20: qty 5

## 2022-09-20 MED ORDER — PHENYLEPHRINE 80 MCG/ML (10ML) SYRINGE FOR IV PUSH (FOR BLOOD PRESSURE SUPPORT)
PREFILLED_SYRINGE | INTRAVENOUS | Status: AC
  Filled 2022-09-20: qty 10

## 2022-09-20 MED ORDER — ROPIVACAINE HCL 5 MG/ML IJ SOLN
INTRAMUSCULAR | Status: DC | PRN
  Administered 2022-09-20: 25 mL via PERINEURAL

## 2022-09-20 MED ORDER — SUGAMMADEX SODIUM 200 MG/2ML IV SOLN
INTRAVENOUS | Status: DC | PRN
  Administered 2022-09-20: 150 mg via INTRAVENOUS

## 2022-09-20 MED ORDER — LACTATED RINGERS IV SOLN: INTRAVENOUS | Status: DC | PRN

## 2022-09-20 SURGICAL SUPPLY — 70 items
ANCHOR JUGGERKNOT W/DRL 2/1.45 (Anchor) IMPLANT
BAG COUNTER SPONGE SURGICOUNT (BAG) ×1 IMPLANT
BIT DRILL LCP QC 2X140 (BIT) IMPLANT
BIT DRILL QC JACOB 2.5X110 (BIT) IMPLANT
BLADE AVERAGE 25X9 (BLADE) IMPLANT
BNDG COHESIVE 4X5 TAN STRL (GAUZE/BANDAGES/DRESSINGS) ×1 IMPLANT
BNDG ELASTIC 4X5.8 VLCR STR LF (GAUZE/BANDAGES/DRESSINGS) IMPLANT
BNDG ELASTIC 6X5.8 VLCR STR LF (GAUZE/BANDAGES/DRESSINGS) IMPLANT
BNDG ESMARK 4X9 LF (GAUZE/BANDAGES/DRESSINGS) IMPLANT
BNDG GAUZE DERMACEA FLUFF 4 (GAUZE/BANDAGES/DRESSINGS) ×2 IMPLANT
BRUSH SCRUB EZ PLAIN DRY (MISCELLANEOUS) ×2 IMPLANT
CORD BIPOLAR FORCEPS 12FT (ELECTRODE) IMPLANT
COVER SURGICAL LIGHT HANDLE (MISCELLANEOUS) ×1 IMPLANT
DRAIN PENROSE 1/4X12 LTX STRL (WOUND CARE) IMPLANT
DRAPE C-ARM 42X72 X-RAY (DRAPES) ×1 IMPLANT
DRAPE C-ARMOR (DRAPES) IMPLANT
DRAPE HALF SHEET 40X57 (DRAPES) IMPLANT
DRAPE INCISE IOBAN 66X45 STRL (DRAPES) IMPLANT
DRAPE ORTHO SPLIT 77X108 STRL (DRAPES) ×1
DRAPE SURG ORHT 6 SPLT 77X108 (DRAPES) ×1 IMPLANT
DRAPE U-SHAPE 47X51 STRL (DRAPES) ×2 IMPLANT
DRSG ADAPTIC 3X8 NADH LF (GAUZE/BANDAGES/DRESSINGS) ×1 IMPLANT
DRSG MEPITEL 4X7.2 (GAUZE/BANDAGES/DRESSINGS) ×1 IMPLANT
ELECT REM PT RETURN 9FT ADLT (ELECTROSURGICAL) ×1
ELECTRODE REM PT RTRN 9FT ADLT (ELECTROSURGICAL) ×1 IMPLANT
EVACUATOR 1/8 PVC DRAIN (DRAIN) IMPLANT
GAUZE PAD ABD 8X10 STRL (GAUZE/BANDAGES/DRESSINGS) ×1 IMPLANT
GAUZE SPONGE 4X4 12PLY STRL (GAUZE/BANDAGES/DRESSINGS) ×2 IMPLANT
GLOVE BIO SURGEON STRL SZ7.5 (GLOVE) ×1 IMPLANT
GLOVE BIOGEL PI IND STRL 7.5 (GLOVE) ×1 IMPLANT
GLOVE BIOGEL PI IND STRL 8 (GLOVE) ×1 IMPLANT
GLOVE SURG ORTHO LTX SZ7.5 (GLOVE) ×2 IMPLANT
GOWN STRL REUS W/ TWL LRG LVL3 (GOWN DISPOSABLE) ×1 IMPLANT
GOWN STRL REUS W/ TWL XL LVL3 (GOWN DISPOSABLE) ×2 IMPLANT
GOWN STRL REUS W/TWL LRG LVL3 (GOWN DISPOSABLE) ×1
GOWN STRL REUS W/TWL XL LVL3 (GOWN DISPOSABLE) ×2
K-WIRE 1.6X150 (WIRE) ×1
KIT BASIN OR (CUSTOM PROCEDURE TRAY) ×1 IMPLANT
KIT TURNOVER KIT B (KITS) ×1 IMPLANT
KWIRE 1.6X150 (WIRE) IMPLANT
MANIFOLD NEPTUNE II (INSTRUMENTS) ×1 IMPLANT
NDL HYPO 25X1 1.5 SAFETY (NEEDLE) IMPLANT
NEEDLE HYPO 25X1 1.5 SAFETY (NEEDLE) IMPLANT
NS IRRIG 1000ML POUR BTL (IV SOLUTION) ×1 IMPLANT
PACK ORTHO EXTREMITY (CUSTOM PROCEDURE TRAY) ×1 IMPLANT
PAD ARMBOARD 7.5X6 YLW CONV (MISCELLANEOUS) ×2 IMPLANT
PLATE BONE LOCK 3H 75MM SHORT (Plate) IMPLANT
SCREW LOCK CORT ST 3.5X24 (Screw) IMPLANT
SCREW LOCK VA ST 2.7X12 (Screw) IMPLANT
SCREW LOCKING 2.7X16MM VA (Screw) IMPLANT
SCREW NON LOCK 2.7X16MM (Screw) IMPLANT
SPONGE T-LAP 18X18 ~~LOC~~+RFID (SPONGE) IMPLANT
STAPLER VISISTAT 35W (STAPLE) ×1 IMPLANT
STOCKINETTE IMPERVIOUS 9X36 MD (GAUZE/BANDAGES/DRESSINGS) IMPLANT
SUCTION FRAZIER HANDLE 10FR (MISCELLANEOUS) ×1
SUCTION TUBE FRAZIER 10FR DISP (MISCELLANEOUS) ×1 IMPLANT
SUT ETHILON 2 0 FS 18 (SUTURE) ×2 IMPLANT
SUT ETHILON 2 0 PSLX (SUTURE) IMPLANT
SUT VIC AB 0 CT1 27 (SUTURE) ×2
SUT VIC AB 0 CT1 27XBRD ANBCTR (SUTURE) ×2 IMPLANT
SUT VIC AB 1 CTX 27 (SUTURE) IMPLANT
SUT VIC AB 2-0 CT1 27 (SUTURE) ×2
SUT VIC AB 2-0 CT1 TAPERPNT 27 (SUTURE) ×2 IMPLANT
SYR 5ML LL (SYRINGE) IMPLANT
SYR CONTROL 10ML LL (SYRINGE) IMPLANT
TOWEL GREEN STERILE (TOWEL DISPOSABLE) ×3 IMPLANT
TOWEL GREEN STERILE FF (TOWEL DISPOSABLE) ×1 IMPLANT
TRAY FOLEY MTR SLVR 16FR STAT (SET/KITS/TRAYS/PACK) IMPLANT
WATER STERILE IRR 1000ML POUR (IV SOLUTION) ×1 IMPLANT
YANKAUER SUCT BULB TIP NO VENT (SUCTIONS) IMPLANT

## 2022-09-20 NOTE — Transfer of Care (Signed)
Immediate Anesthesia Transfer of Care Note  Patient: Cynthia Collins  Procedure(s) Performed: OPEN REDUCTION INTERNAL FIXATION (ORIF) DISTAL HUMERUS FRACTURE (Right)  Patient Location: PACU  Anesthesia Type:GA combined with regional for post-op pain  Level of Consciousness: awake, alert , and oriented  Airway & Oxygen Therapy: Patient Spontanous Breathing and Patient connected to nasal cannula oxygen  Post-op Assessment: Report given to RN and Post -op Vital signs reviewed and stable  Post vital signs: Reviewed and stable  Last Vitals:  Vitals Value Taken Time  BP    Temp    Pulse 69 09/20/22 1154  Resp 13 09/20/22 1154  SpO2 100 % 09/20/22 1154  Vitals shown include unvalidated device data.  Last Pain:  Vitals:   09/20/22 0622  TempSrc: Oral  PainSc: 0-No pain         Complications: No notable events documented.

## 2022-09-20 NOTE — Anesthesia Procedure Notes (Signed)
Procedure Name: Intubation Date/Time: 09/20/2022 8:39 AM  Performed by: Mariea Clonts, CRNAPre-anesthesia Checklist: Patient identified, Emergency Drugs available, Suction available and Patient being monitored Patient Re-evaluated:Patient Re-evaluated prior to induction Oxygen Delivery Method: Circle System Utilized Preoxygenation: Pre-oxygenation with 100% oxygen Induction Type: IV induction Ventilation: Mask ventilation without difficulty Laryngoscope Size: Mac and 3 Grade View: Grade I Tube type: Oral Tube size: 7.0 mm Number of attempts: 1 Airway Equipment and Method: Stylet and Oral airway Placement Confirmation: ETT inserted through vocal cords under direct vision, positive ETCO2 and breath sounds checked- equal and bilateral Tube secured with: Tape Dental Injury: Teeth and Oropharynx as per pre-operative assessment

## 2022-09-20 NOTE — Brief Op Note (Signed)
09/20/2022  4:47 PM  PATIENT:  Cynthia Collins  77 y.o. female  PRE-OPERATIVE DIAGNOSIS:  RIGHT HUMERUS TRANSCONDYALR FRACTURE   POST-OPERATIVE DIAGNOSIS:   1. RIGHT HUMERUS TRANSCONDYALR FRACTURE  2. RIGHT RADIAL HEAD FRACTURE  PROCEDURE:  Procedure(s): 1. OPEN REDUCTION INTERNAL FIXATION (ORIF) DISTAL HUMERUS FRACTURE (Right) 2. OPEN TREATMENT OF RADIAL HEAD FRACTURE 3. REPAIR OF LATERAL ULNAR COLLATERAL LIGAMENT   SURGEON:  Surgeon(s) and Role:    Altamese Avilla, MD - Primary  PHYSICIAN ASSISTANT: Wandra Arthurs, [place]  [phone]

## 2022-09-20 NOTE — Anesthesia Postprocedure Evaluation (Signed)
Anesthesia Post Note  Patient: Tariana Moldovan  Procedure(s) Performed: OPEN REDUCTION INTERNAL FIXATION (ORIF) DISTAL HUMERUS FRACTURE (Right)     Patient location during evaluation: PACU Anesthesia Type: Regional and General Level of consciousness: awake Pain management: pain level controlled Vital Signs Assessment: post-procedure vital signs reviewed and stable Respiratory status: spontaneous breathing, nonlabored ventilation and respiratory function stable Cardiovascular status: blood pressure returned to baseline and stable Postop Assessment: no apparent nausea or vomiting Anesthetic complications: no   No notable events documented.  Last Vitals:  Vitals:   09/20/22 1230 09/20/22 1245  BP: 120/63 (!) 111/54  Pulse: 68 60  Resp: 14 15  Temp:  36.4 C  SpO2: 96% 95%    Last Pain:  Vitals:   09/20/22 1245  TempSrc:   PainSc: 0-No pain                 Nilda Simmer

## 2022-09-20 NOTE — Anesthesia Procedure Notes (Addendum)
Anesthesia Regional Block: Supraclavicular block   Pre-Anesthetic Checklist: , timeout performed,  Correct Patient, Correct Site, Correct Laterality,  Correct Procedure, Correct Position, site marked,  Risks and benefits discussed,  Surgical consent,  Pre-op evaluation,  At surgeon's request and post-op pain management  Laterality: Right  Prep: chloraprep       Needles:  Injection technique: Single-shot  Needle Type: Echogenic Stimulator Needle     Needle Length: 9cm  Needle Gauge: 21     Additional Needles:   Procedures:,,,, ultrasound used (permanent image in chart),,    Narrative:  Start time: 09/20/2022 7:24 AM End time: 09/20/2022 7:28 AM Injection made incrementally with aspirations every 5 mL.  Performed by: Personally  Anesthesiologist: Nilda Simmer, MD  Additional Notes: Discussed risks and benefits of nerve block including, but not limited to, prolonged and/or permanent nerve injury involving sensory and/or motor function. Monitors were applied and a time-out was performed. The nerve and associated structures were visualized under ultrasound guidance. After negative aspiration, local anesthetic was slowly injected around the nerve. There was no evidence of high pressure during the procedure. There were no paresthesias. VSS remained stable and the patient tolerated the procedure well.

## 2022-09-20 NOTE — Discharge Instructions (Signed)
Elevate with hand above the elbow and elbow above the heart. Frequently move fingers. Apply ice to elbow regularly. Take first dose of narcotics as soon as you feel any sensation returning as the block wears off. If you wait until you have pain, you will likely have waited too long.  Please call for follow up visit to be seen in two weeks at [place], [phone].

## 2022-09-20 NOTE — H&P (Signed)
[facility] H&P  Patient ID: Cynthia Collins MRN: [id number] DOB/AGE: Feb 14, 1946 77 y.o.  Chief Complaint:  fall HPI: Cynthia Collins is an 77 y.o. female.Dominant arm supracondylar with trochlear shear.  Past Medical History:  Diagnosis Date   Allergy    Anxiety    Asthma    Complication of anesthesia    slow to wake up   Heart murmur    Hyperlipidemia    Paroxysmal SVT (supraventricular tachycardia)    Pre-diabetes    Prediabetes    Squamous cell carcinoma of skin 08/03/2021   Mid chest - [facility] ED&C 09/07/21    Past Surgical History:  Procedure Laterality Date   APPENDECTOMY  08/20/1968   CESAREAN SECTION  08/20/1984   OOPHORECTOMY Right 08/20/1968   SKIN CANCER EXCISION      Family History  Problem Relation Age of Onset   Alcohol abuse Mother    Arthritis Mother    Heart disease Mother    Hypertension Mother    Mental illness Mother    Stroke Mother    Hypothyroidism Mother    Stroke Father    Heart disease Father    Hearing loss Father    Alcohol abuse Sister    Lung cancer Sister    Graves' disease Sister    Heart disease Maternal Grandmother    Diabetes Maternal Grandfather    Heart disease Paternal Grandmother    Breast cancer Neg Hx    Social History:  reports that she has never smoked. She has never used smokeless tobacco. She reports that she does not currently use alcohol after a past usage of about 1.0 standard drink of alcohol per week. She reports that she does not use drugs.  Allergies:  Allergies  Allergen Reactions   Red Yeast Rice Extract     Muscle pain   Statins     Muscle pain  Joint pain     Zetia [Ezetimibe]     Nausea/stomach pain/gagging   Zoloft [Sertraline] Anxiety    Medications Prior to Admission  Medication Sig Dispense Refill   ALPRAZolam (XANAX) 0.25 MG tablet Take 1 tablet by mouth daily as needed for anxiety. Use sparingly. (Patient taking differently: Take 0.125-0.25 mg by  mouth daily as needed for anxiety.) 10 tablet 0   Ascorbic Acid (VITAMIN C) 1000 MG tablet Take 1,000 mg by mouth daily.     Coenzyme Q10 (CO Q 10 PO) Take by mouth. Three times weekly.     GARLIC PO Take by mouth daily.     Hyaluronic Acid-Vitamin C (HYALURONIC ACID PO) Take 100 mg by mouth daily.     ibuprofen (ADVIL) 200 MG tablet Take 200 mg by mouth every 6 (six) hours as needed for headache or mild pain.     loperamide (IMODIUM A-D) 2 MG tablet Take 2 mg by mouth as needed for diarrhea or loose stools.     Menaquinone-7 (VITAMIN K2 PO) Take by mouth daily.     Misc Natural Products (LUTEIN 20 PO) Take 20 mg by mouth daily.     Misc Natural Products (WHITE WILLOW BARK PO) Take by mouth. Patient states she takes this for pain PRN     Multiple Vitamin (MULTIVITAMIN) tablet Take 1 tablet by mouth in the morning and at bedtime.     NATTOKINASE PO Take 1 capsule by mouth daily.     NON FORMULARY Take 1 capsule by mouth daily. [employer] Supreme     NON FORMULARY Bone  Up 1 capsule 3 times a week     NON FORMULARY BCQ 3 days a week.     NON FORMULARY Collagen Powder Takes 1 scoop daily.     Omega-3 Fatty Acids (FISH OIL PO) Take by mouth. 4 times a week.     RESVERATROL PO Take 1 capsule by mouth 3 (three) times a week.     TURMERIC CURCUMIN PO Take 1 capsule by mouth daily.     VITAMIN D, CHOLECALCIFEROL, PO Take by mouth daily.     Whey Protein POWD Take 1 Scoop by mouth daily.     ELDERBERRY PO Take 1 tablet by mouth daily as needed (cold symptoms).     Zinc 50 MG CAPS Take 50 mg by mouth as needed (cold symptoms).      Results for orders placed or performed during the hospital encounter of 09/20/22 (from the past 48 hour(s))  Basic metabolic panel per protocol     Status: Abnormal   Collection Time: 09/20/22  6:15 AM  Result Value Ref Range   Sodium 137 135 - 145 mmol/L   Potassium 3.5 3.5 - 5.1 mmol/L   Chloride 100 98 - 111 mmol/L   CO2 27 22 - 32 mmol/L   Glucose, Bld 106 (H) 70 -  99 mg/dL    Comment: Glucose reference range applies only to samples taken after fasting for at least 8 hours.   BUN 12 8 - 23 mg/dL   Creatinine, Ser 0.84 0.44 - 1.00 mg/dL   Calcium 9.3 8.9 - 10.3 mg/dL   GFR, Estimated >60 >60 mL/min    Comment: (NOTE) Calculated using the CKD-EPI Creatinine Equation (2021)    Anion gap 10 5 - 15    Comment: Performed at [facility] [address] [place], [place] [address]  CBC per protocol     Status: None   Collection Time: 09/20/22  6:15 AM  Result Value Ref Range   WBC 6.8 4.0 - 10.5 K/uL   RBC 4.61 3.87 - 5.11 MIL/uL   Hemoglobin 13.6 12.0 - 15.0 g/dL   HCT 40.9 36.0 - 46.0 %   MCV 88.7 80.0 - 100.0 fL   MCH 29.5 26.0 - 34.0 pg   MCHC 33.3 30.0 - 36.0 g/dL   RDW 12.7 11.5 - 15.5 %   Platelets 242 150 - 400 K/uL   nRBC 0.0 0.0 - 0.2 %    Comment: Performed at [facility], [place] [address], [place], [place] [address]   No results found.  ROS No recent fever, bleeding abnormalities, urologic dysfunction, GI problems, or weight gain.  Blood pressure 120/68, pulse 68, temperature 98.1 F (36.7 C), temperature source Oral, resp. rate 10, height '4\' 11"'$  (1.499 m), weight 51.7 kg, SpO2 100 %. Physical Exam NCAT RRR CTA  RUEx   Splint in place  Sens  Ax/R/M/U intact  Mot   Ax/ R/ PIN/ M/ AIN/ U intact  Brisk CR  Assessment/Plan  Right supracondylar humerus fracture  The risks and benefits of surgery were discussed with the patient and her son, including the possibility of infection, nerve injury, vessel injury, wound breakdown, arthritis, symptomatic hardware, DVT/ PE, loss of motion, malunion, nonunion, and need for further surgery among others. These risks were acknowledged and consent provided to proceed.    Altamese Rulo, MD [facility], [facility] [phone]  09/20/2022, 8:12 AM  [facility] [place] [address] [phone] (F)

## 2022-09-21 ENCOUNTER — Encounter (HOSPITAL_COMMUNITY): Payer: Self-pay | Admitting: Orthopedic Surgery

## 2022-09-23 NOTE — Op Note (Unsigned)
Cynthia Collins, Cynthia Collins MEDICAL RECORD NO: [id number] ACCOUNT NO: [id number] DATE OF BIRTH: November 10, 1945 FACILITY: [facility] LOCATION: [facility] PHYSICIAN: Astrid Divine. Marcelino Scot, MD  Operative Report   DATE OF PROCEDURE: 09/20/2022  PREOPERATIVE DIAGNOSIS:  Right transcondylar distal humerus fracture.  POSTOPERATIVE DIAGNOSES: 1.  Right transcondylar distal humerus fracture. 2.  Right radial head fracture.  PROCEDURE: 1.  Open reduction internal fixation of right transcondylar distal humerus fracture. 2.  Open treatment of radial head fracture. 3.  Repair of lateral ulnar collateral ligament.  SURGEON:  Altamese Roscoe, MD  ASSISTANT:  Jenkins Rouge, RNFA.  ANESTHESIA:  General supplemented by regional block.  COMPLICATIONS:  None.  TOURNIQUET: None.  DISPOSITION:  To PACU.  CONDITION: Stable.  BRIEF SUMMARY OF INDICATIONS:  The patient is a 77 year old white female who sustained a complex right elbow injury in a ground level fall.  CT scan demonstrated a complex fracture dislocation involving the trochlea and supracondylar humerus.  I did  discuss with her the options for treatment including repair and replacement as well as nonsurgical treatment with delayed excision of the fragments.  Risks included, malunion, nonunion, infection, loss of motion, DVT, PE, need for further surgery, nerve  injury, vessel injury and heterotopic ossification.  The odds of which were significantly increased because of the delayed nature on which this surgery was being performed.  She acknowledged these risks and did provide consent to proceed.  BRIEF SUMMARY OF PROCEDURE:  The patient was taken to the operating room after administration of a regional block and antibiotics.  General anesthesia was induced.  She was positioned laterally with the right elbow over a radiolucent bump.  A  chlorhexidine wash, Betadine scrub and paint was performed.  A timeout was held after draping, a posterior approach was made to the  elbow, carrying dissection down to the triceps fascia, which was then divided along its lateral border.  I was able to  then go down to the periosteal layer keeping it intact and continuing proximally that I could put the shortest supracondylar humeral plate from [employer] in position.  I then went around the lateral side, being careful to preserve the periosteal blood  supply but to enter the joint capsule where the capitellar fragment was identified.  There was an additional trochlear segment.  My visibility was improved by additional careful elevation, so that I could see all the way in the joint, posterolaterally.   I did not do any significant dissection medially.  The fracture site was compressed in the supracondylar area and then screws left out of the most distal articular segment of the humerus, so as to allow free positioning of the articular fragments of the  trochlea.  I cleaned the segments of hematoma with a curette and lavage.  Unfortunately, there were multiple free fragments, some of which had cartilage attached and some of which did not.  I was unable to seek the medial trochlear piece in a bed that  would confirm its reduction and in similar manner, I was unable to secure fixation of the capitellum in a stable manner either.  I then removed these 2 major segments again, there were multiple other small fragments that were not reconstructable.  I  attempted to put them together on the back table, but there was no areas of congruency.  I then attempted just to stabilize the capitellar piece, but this was not feasible either because of the lack of underlying bone and its size.  Consequently,  decision was made to simply  not perform a reduction of the segments, but to excise them.  Of note, as the lateral approach was made I did identify a relatively nondisplaced radial head fracture.  This was not depressed, but there was scraped cartilage  there, which was resulting in a free chondral fragment.   I removed or debrided this off the radial head fracture.  I then carefully examined to make sure there was no other impediment to motion either in rotation or flexion and extension arc.  Wound was  irrigated thoroughly.  I did place additional screws in the cortex and then examined the elbow under fluoroscopy and did not identify any instability in extension including extension with a supination.  The deep fascia was reapproximated with  figure-of-eight #1 Vicryl, but first I placed a suture anchor into the lateral ulnar collateral ligament insertion site of the epicondyle.  Using imbrication suture with #2 FiberWire achieving a direct repair of the lateral ulnar collateral ligament to  stabilize the elbow.  A 0 Vicryl, 2-0 Vicryl, 2-0 nylon was used.  Sterile gently compressive dressing from wrist to the shoulder.  The patient was awakened from anesthesia and transported to PACU in stable condition.  PROGNOSIS: Patient will be in the splint for the next 2 weeks and then will begin a graduated range of motion.  Fortunately, she will not have a lifting restriction, but some difficulty with the elbow either with regard to motion or stability would not  be unexpected, but was the best option available intraoperatively.  This has been discussed with the family.   SHY D: 09/23/2022 5:43:58 pm T: 09/23/2022 7:35:00 pm  JOB: [id number]/ [id number]

## 2022-10-03 ENCOUNTER — Other Ambulatory Visit: Payer: Self-pay | Admitting: Specialist

## 2022-10-03 ENCOUNTER — Encounter: Payer: Self-pay | Admitting: Orthopaedic Surgery

## 2022-10-03 DIAGNOSIS — S42451A Displaced fracture of lateral condyle of right humerus, initial encounter for closed fracture: Secondary | ICD-10-CM

## 2022-10-09 ENCOUNTER — Encounter: Payer: Medicare Other | Admitting: Occupational Therapy

## 2022-10-09 ENCOUNTER — Ambulatory Visit: Payer: Medicare Other | Attending: Orthopedic Surgery | Admitting: Occupational Therapy

## 2022-10-09 ENCOUNTER — Encounter: Payer: Self-pay | Admitting: Occupational Therapy

## 2022-10-09 DIAGNOSIS — L905 Scar conditions and fibrosis of skin: Secondary | ICD-10-CM | POA: Insufficient documentation

## 2022-10-09 DIAGNOSIS — M6281 Muscle weakness (generalized): Secondary | ICD-10-CM | POA: Diagnosis present

## 2022-10-09 DIAGNOSIS — M25521 Pain in right elbow: Secondary | ICD-10-CM | POA: Diagnosis present

## 2022-10-09 DIAGNOSIS — M25621 Stiffness of right elbow, not elsewhere classified: Secondary | ICD-10-CM

## 2022-10-09 NOTE — Therapy (Signed)
[facility] [address] [place], [address] Phone: [phone]   Fax:  [phone]  Occupational Therapy Evaluation  Patient Details  Name: Cynthia Collins MRN: [id number] Date of Birth: 11-16-1945 Referring Provider (OT): DR Marcelino Scot   Encounter Date: 10/09/2022   OT End of Session - 10/09/22 1924     Visit Number 1    Number of Visits 16    Date for OT Re-Evaluation 12/04/22    OT Start Time 1317    OT Stop Time 1400    OT Time Calculation (min) 43 min    Activity Tolerance Patient tolerated treatment well    Behavior During Therapy [facility] for tasks assessed/performed             Past Medical History:  Diagnosis Date   Allergy    Anxiety    Asthma    Complication of anesthesia    slow to wake up   Heart murmur    Hyperlipidemia    Paroxysmal SVT (supraventricular tachycardia)    Pre-diabetes    Prediabetes    Squamous cell carcinoma of skin 08/03/2021   Mid chest - [facility] ED&C 09/07/21    Past Surgical History:  Procedure Laterality Date   APPENDECTOMY  08/20/1968   CESAREAN SECTION  08/20/1984   OOPHORECTOMY Right 08/20/1968   ORIF HUMERUS FRACTURE Right 09/20/2022   Procedure: OPEN REDUCTION INTERNAL FIXATION (ORIF) DISTAL HUMERUS FRACTURE;  Surgeon: Altamese Frisco, MD;  Location: [facility];  Service: Orthopedics;  Laterality: Right;   SKIN CANCER EXCISION      There were no vitals filed for this visit.   Subjective Assessment - 10/09/22 1902     Subjective  I was very active before this fall - did yoga could stand on my head , on my hands, do planks and 5-10 lbs weights for exercise- walk daily 3 1/2 miles - I am ready to work this arm    Pertinent History Pt fell on 09/01/22 - seen at ED - refer to Ortho - diagnosis of RIGHT HUMERUS [place] DIAGNOSIS:    1. RIGHT HUMERUS TRANSCONDYALR FRACTURE   2. RIGHT RADIAL HEAD FRACTURE     PROCEDURE:  Procedure(s):  1. OPEN  REDUCTION INTERNAL FIXATION (ORIF) DISTAL HUMERUS FRACTURE (Right)  2. OPEN TREATMENT OF RADIAL HEAD FRACTURE  3. REPAIR OF LATERAL ULNAR COLLATERAL LIGAMENT - Surgery on 09/20/22 and refer to OT to initiate ROM    Patient Stated Goals I want my motion and strenght back so I can stand on my hands and head in yoga again, lift weigths    Currently in Pain? Yes    Pain Score 3     Pain Location Elbow    Pain Orientation Right    Pain Descriptors / Indicators Aching;Tightness;Tender;Sore    Pain Type Surgical pain    Pain Onset 1 to 4 weeks ago    Pain Frequency Intermittent               [facility] OT Assessment - 10/09/22 0001       Assessment   Medical Diagnosis R transcondylar humerus fx ORIF    Referring Provider (OT) DR Marcelino Scot    Onset Date/Surgical Date 09/20/22    Hand Dominance Right      Precautions   Precaution Comments no strength      Home  Environment   Lives With Alone      Prior Function  Vocation Retired    Leisure walk 3 1/2 miles day, yoga , work out 5-10 lbs, 2 yrs of grand baby      AROM   Right Elbow Flexion 112    Right Elbow Extension -40    Right Wrist Extension 74 Degrees    Right Wrist Flexion 70 Degrees            contrast done with heat light elbow extention stretch Done in clinic -followed by  AAROM in supine for elbow flexion , ext - 3 position As  well as over head elbow ext -support upper arm and flexoin to touch mouth , nose and then ear 10reps  Pt to do 3 x day to do contrast  prior to scar massage and AROM and AAROM for elbow flexion, ext in supine , standing on wall pain free  Scar massage ed on and hand out provided and review And cica scar pad use night time  Tubi grip E for light compression for edema control  Pt to keep pain under 2/10                    OT Education - 10/09/22 1924     Education Details Finddingof eval and HEP    Person(s) Educated Patient    Methods Explanation;Demonstration;Tactile  cues;Handout    Comprehension Verbal cues required;Returned demonstration;Verbalized understanding                 OT Long Term Goals - 10/09/22 1932       OT LONG TERM GOAL #1   Title Patient to be independent in home program to initiate him massage to increase flexion extension and right arm today to feed self.    Baseline No knowledge of scar management as well as elbow flat on 112 and extend to -40.  With and pain 4/10 at elbow.    Time 3    Period Weeks    Status New    Target Date 10/30/22      OT LONG TERM GOAL #2   Title Right elbow flexion increased for patient to be able to wash hair and make ponytail and put earrings on without increase symptoms.    Baseline Right elbow flexion 112 degrees with some discomfort.    Time 6    Period Weeks    Status New    Target Date 11/20/22      OT LONG TERM GOAL #3   Title Right elbow extension and 2-10 for patient to reach head as well as into pocket without increase symptoms.  It    Baseline Right elbow extension -40.  Did not initiate any scar management as well as home exercise program to half weeks postop.    Time 6    Period Weeks    Status New    Target Date 11/20/22      OT LONG TERM GOAL #4   Title Strength in right elbow improved patient to be able to push and pull heavy door as well as can really more than 5 pounds without increase symptoms    Baseline Patient 2-1/2 weeks postop no strengthening only active range of motion.  Scar management and initiated pain 4/10 with active range of motion.    Time 8    Period Weeks    Status New    Target Date 12/04/22                   Plan - 10/09/22  1927     Clinical Impression Statement Patient present at OT evaluation with a right Transcondylar humeral distal fracture with ORIF.  With ulnar collateral ligament repair.  At surgery was done 09/20/2022.  Patient with increased scar tissue, increase edema with decreased flexion and extension of elbow.  Is right-hand  dominant.  Patient was educated today in scar-as well as use of Cica -Care scar pad at nighttime.  With use of Tubigrip D to decrease edema and elbow.  Patient's wrist range of motion as well as forearm with in functional limits.  Elbow flexion 112 and extension -40.  Patient was educated in use of contrast with using heating pad for light stretch for elbow extension.  Full by active range of motion for elbow flexion extension in a pain-free range in all 3 positions palm up and neutral and palm down.  Patient was very active prior to fall doing yoga as well as weights and walking 3-1/2 miles a day.  Reinforced with patient she is only 2 and half weeks focus on scar massage, contrast, even control active range of motion.  Patient limited in functional use of right dominant hand and arm in ADLs and IADLs.  Patient can benefit from skilled OT services decrease edema and scar tissue as well as pain and motion and strength to return to prior level of motion.  At    OT Occupational Profile and History Problem Focused Assessment - Including review of records relating to presenting problem    Occupational performance deficits (Please refer to evaluation for details): ADL's;IADL's;Play;Social Participation;Leisure    Body Structure / Function / Physical Skills ADL;Edema;Decreased knowledge of precautions;Flexibility;IADL;Pain;Strength;Scar mobility;ROM;UE functional use    Rehab Potential Good    Clinical Decision Making Limited treatment options, no task modification necessary    Comorbidities Affecting Occupational Performance: None    Modification or Assistance to Complete Evaluation  No modification of tasks or assist necessary to complete eval    OT Frequency 2x / week    OT Duration 8 weeks    OT Treatment/Interventions Self-care/ADL training;Moist Heat;Paraffin;Contrast Bath;Therapeutic exercise;Manual Therapy;Passive range of motion;Scar mobilization;Splinting;Patient/family education    Consulted and  Agree with Plan of Care Patient             Patient will benefit from skilled therapeutic intervention in order to improve the following deficits and impairments:   Body Structure / Function / Physical Skills: ADL, Edema, Decreased knowledge of precautions, Flexibility, IADL, Pain, Strength, Scar mobility, ROM, UE functional use       Visit Diagnosis: Pain in right elbow  Stiffness of right elbow, not elsewhere classified  Muscle weakness (generalized)  Scar condition and fibrosis of skin    Problem List Patient Active Problem List   Diagnosis Date Noted   CAD (coronary artery disease) 03/08/2022   Prediabetes 01/27/2021   Palpitations 01/27/2021   Left axillary pain 12/21/2019   Osteoporosis 12/21/2019   Fatigue 12/21/2019   Familial hyperlipidemia 11/10/2019   Positive ANA (antinuclear antibody) 11/10/2019   Anxiety 11/10/2019   Murmur 11/10/2019    Rosalyn Gess, OTR/L,CLT 10/09/2022, 7:36 PM  [facility] [address], [place], [address] Phone: [phone]   Fax:  [phone]  Name: Kiira Domino MRN: [id number] Date of Birth: 03-12-1946

## 2022-10-16 ENCOUNTER — Ambulatory Visit: Payer: Medicare Other | Admitting: Occupational Therapy

## 2022-10-16 DIAGNOSIS — M25621 Stiffness of right elbow, not elsewhere classified: Secondary | ICD-10-CM

## 2022-10-16 DIAGNOSIS — M25521 Pain in right elbow: Secondary | ICD-10-CM | POA: Diagnosis not present

## 2022-10-16 DIAGNOSIS — M6281 Muscle weakness (generalized): Secondary | ICD-10-CM

## 2022-10-16 NOTE — Therapy (Signed)
[facility] [address], [place], [address] Phone: [phone]   Fax:  [phone]  Occupational Therapy Treatment  Patient Details  Name: Cynthia Collins MRN: [id number] Date of Birth: 12-21-1945 Referring Provider (OT): DR Marcelino Scot   Encounter Date: 10/16/2022   OT End of Session - 10/16/22 1722     Visit Number 2    Number of Visits 16    Date for OT Re-Evaluation 12/04/22    OT Start Time 1531    OT Stop Time 1620    OT Time Calculation (min) 49 min    Activity Tolerance Patient tolerated treatment well    Behavior During Therapy [facility] for tasks assessed/performed             Past Medical History:  Diagnosis Date   Allergy    Anxiety    Asthma    Complication of anesthesia    slow to wake up   Heart murmur    Hyperlipidemia    Paroxysmal SVT (supraventricular tachycardia)    Pre-diabetes    Prediabetes    Squamous cell carcinoma of skin 08/03/2021   Mid chest - [facility] ED&C 09/07/21    Past Surgical History:  Procedure Laterality Date   APPENDECTOMY  08/20/1968   CESAREAN SECTION  08/20/1984   OOPHORECTOMY Right 08/20/1968   ORIF HUMERUS FRACTURE Right 09/20/2022   Procedure: OPEN REDUCTION INTERNAL FIXATION (ORIF) DISTAL HUMERUS FRACTURE;  Surgeon: Altamese [place], MD;  Location: [place];  Service: Orthopedics;  Laterality: Right;   SKIN CANCER EXCISION      There were no vitals filed for this visit.   Subjective Assessment - 10/16/22 1719     Subjective  I am very active - done some of my balance and range of motion yoga exercises - I can touch my chin, mouth and ear on the L -I am my biggest enemy because so active    Pertinent History Pt fell on 09/01/22 - seen at ED - refer to Ortho - diagnosis of RIGHT HUMERUS [place] DIAGNOSIS:    1. RIGHT HUMERUS TRANSCONDYALR FRACTURE   2. RIGHT RADIAL HEAD FRACTURE     PROCEDURE:  Procedure(s):  1. OPEN REDUCTION INTERNAL  FIXATION (ORIF) DISTAL HUMERUS FRACTURE (Right)  2. OPEN TREATMENT OF RADIAL HEAD FRACTURE  3. REPAIR OF LATERAL ULNAR COLLATERAL LIGAMENT - Surgery on 09/20/22 and refer to OT to initiate ROM    Patient Stated Goals I want my motion and strenght back so I can stand on my hands and head in yoga again, lift weigths    Currently in Pain? Yes    Pain Score 2     Pain Location Elbow    Pain Orientation Right    Pain Descriptors / Indicators Aching;Tightness    Pain Type Surgical pain    Pain Onset 1 to 4 weeks ago    Pain Frequency Several days a week                [facility] OT Assessment - 10/16/22 0001       AROM   Right Elbow Flexion 125    Right Elbow Extension -20    Right Wrist Extension 74 Degrees    Right Wrist Flexion 80 Degrees              assess elbow in all planes - assess scar - and wrist AROM  OT Treatments/Exercises (OP) - 10/16/22 0001       Moist Heat Therapy   Number Minutes Moist Heat 8 Minutes    Moist Heat Location Elbow   extention stretch prior to soft tissue          In supine with arm on pillow Soft tissue mobs using graston tool nr 2 sweeping over volar and dorsal upper arm, bicep  tricep and forearm as well as manual by OT over upper arm and forearm - myofascial Mini massager used on scar   AAROM in supine for elbow flexion , ext - 3 position As  well as over head elbow ext -support upper arm and flexoin to touch mouth , nose and then ear 10reps  Pt to do 3 x day to do contrast  prior to scar massage and AROM and AAROM for elbow flexion, ext in supine , standing on wall pain free  Scar massage ed on and hand out provided and review And cica scar pad use night time  Tubi grip E for light compression for edema control  Pt to keep pain under 2/10          OT Education - 10/16/22 1722     Education Details progress and ROM    Person(s) Educated Patient    Methods Explanation;Demonstration;Tactile cues;Handout     Comprehension Verbal cues required;Returned demonstration;Verbalized understanding                 OT Long Term Goals - 10/09/22 1932       OT LONG TERM GOAL #1   Title Patient to be independent in home program to initiate him massage to increase flexion extension and right arm today to feed self.    Baseline No knowledge of scar management as well as elbow flat on 112 and extend to -40.  With and pain 4/10 at elbow.    Time 3    Period Weeks    Status New    Target Date 10/30/22      OT LONG TERM GOAL #2   Title Right elbow flexion increased for patient to be able to wash hair and make ponytail and put earrings on without increase symptoms.    Baseline Right elbow flexion 112 degrees with some discomfort.    Time 6    Period Weeks    Status New    Target Date 11/20/22      OT LONG TERM GOAL #3   Title Right elbow extension and 2-10 for patient to reach head as well as into pocket without increase symptoms.  It    Baseline Right elbow extension -40.  Did not initiate any scar management as well as home exercise program to half weeks postop.    Time 6    Period Weeks    Status New    Target Date 11/20/22      OT LONG TERM GOAL #4   Title Strength in right elbow improved patient to be able to push and pull heavy door as well as can really more than 5 pounds without increase symptoms    Baseline Patient 2-1/2 weeks postop no strengthening only active range of motion.  Scar management and initiated pain 4/10 with active range of motion.    Time 8    Period Weeks    Status New    Target Date 12/04/22                   Plan - 10/16/22  1722     Clinical Impression Statement Patient present at OT evaluation with a right Transcondylar humeral distal fracture with ORIF.  With ulnar collateral ligament repair.  At surgery was done 09/20/2022.  Patient with increased scar tissue, increase edema with decreased flexion and extension of elbow.  Is right-hand dominant.  Pt  arrive with increase elbow flexion and extention this date compare to last week. Pt to cont with scar massage and use of Cica -Care scar pad at nighttime.  With use of Tubigrip D to decrease edema on elbow.  Patient's wrist range of motion as well as forearm WNL.  Elbow flexion improve to  125 and extension  -20 degrees  Patient to cont with contrast with using heating pad for light stretch for elbow extension. A ROM and light pain free AAROM for elbow flexion/ extension in a pain-free range in all 3 positions palm up and neutral and palm down.  Patient was very active prior to fall doing yoga as well as weights and walking 3-1/2 miles a day.  Reinforced with patient she is only 3 1/2 wks out from surgery-  focus on scar massage, contrast,  active range of motion.  Patient limited in functional use of right dominant hand and arm in ADLs and IADLs.  Patient can benefit from skilled OT services decrease edema and scar tissue as well as pain and motion and strength to return to prior level of motion.  At    OT Occupational Profile and History Problem Focused Assessment - Including review of records relating to presenting problem    Occupational performance deficits (Please refer to evaluation for details): ADL's;IADL's;Play;Social Participation;Leisure    Body Structure / Function / Physical Skills ADL;Edema;Decreased knowledge of precautions;Flexibility;IADL;Pain;Strength;Scar mobility;ROM;UE functional use    Rehab Potential Good    Clinical Decision Making Limited treatment options, no task modification necessary    Comorbidities Affecting Occupational Performance: None    Modification or Assistance to Complete Evaluation  No modification of tasks or assist necessary to complete eval    OT Frequency 2x / week    OT Duration 8 weeks    OT Treatment/Interventions Self-care/ADL training;Moist Heat;Paraffin;Contrast Bath;Therapeutic exercise;Manual Therapy;Passive range of motion;Scar  mobilization;Splinting;Patient/family education    Consulted and Agree with Plan of Care Patient             Patient will benefit from skilled therapeutic intervention in order to improve the following deficits and impairments:   Body Structure / Function / Physical Skills: ADL, Edema, Decreased knowledge of precautions, Flexibility, IADL, Pain, Strength, Scar mobility, ROM, UE functional use       Visit Diagnosis: Pain in right elbow  Stiffness of right elbow, not elsewhere classified  Muscle weakness (generalized)    Problem List Patient Active Problem List   Diagnosis Date Noted   CAD (coronary artery disease) 03/08/2022   Prediabetes 01/27/2021   Palpitations 01/27/2021   Left axillary pain 12/21/2019   Osteoporosis 12/21/2019   Fatigue 12/21/2019   Familial hyperlipidemia 11/10/2019   Positive ANA (antinuclear antibody) 11/10/2019   Anxiety 11/10/2019   Murmur 11/10/2019    Rosalyn Gess, OTR/L,CLT 10/16/2022, 5:27 PM  [facility] [address], [place], [address] Phone: [phone]   Fax:  [phone]  Name: Dari Martorella MRN: [id number] Date of Birth: [birth year]

## 2022-10-18 ENCOUNTER — Ambulatory Visit: Payer: Medicare Other | Admitting: Occupational Therapy

## 2022-10-18 DIAGNOSIS — M25621 Stiffness of right elbow, not elsewhere classified: Secondary | ICD-10-CM

## 2022-10-18 DIAGNOSIS — M6281 Muscle weakness (generalized): Secondary | ICD-10-CM

## 2022-10-18 DIAGNOSIS — M25521 Pain in right elbow: Secondary | ICD-10-CM

## 2022-10-18 DIAGNOSIS — L905 Scar conditions and fibrosis of skin: Secondary | ICD-10-CM

## 2022-10-18 NOTE — Therapy (Signed)
[facility] [address] [place], [address] Phone: [phone]   Fax:  [phone]  Occupational Therapy Treatment  Patient Details  Name: Cynthia Collins MRN: [id number] Date of Birth: Aug 17, 1946 Referring Provider (OT): DR Marcelino Scot   Encounter Date: 10/18/2022   OT End of Session - 10/18/22 1315     Visit Number 3    Number of Visits 16    Date for OT Re-Evaluation 12/04/22    OT Start Time 1315    OT Stop Time 1402    OT Time Calculation (min) 47 min    Activity Tolerance Patient tolerated treatment well    Behavior During Therapy [facility] for tasks assessed/performed             Past Medical History:  Diagnosis Date   Allergy    Anxiety    Asthma    Complication of anesthesia    slow to wake up   Heart murmur    Hyperlipidemia    Paroxysmal SVT (supraventricular tachycardia)    Pre-diabetes    Prediabetes    Squamous cell carcinoma of skin 08/03/2021   Mid chest - [facility] ED&C 09/07/21    Past Surgical History:  Procedure Laterality Date   APPENDECTOMY  08/20/1968   CESAREAN SECTION  08/20/1984   OOPHORECTOMY Right 08/20/1968   ORIF HUMERUS FRACTURE Right 09/20/2022   Procedure: OPEN REDUCTION INTERNAL FIXATION (ORIF) DISTAL HUMERUS FRACTURE;  Surgeon: Altamese [place], MD;  Location: [place];  Service: Orthopedics;  Laterality: Right;   SKIN CANCER EXCISION      There were no vitals filed for this visit.   Subjective Assessment - 10/18/22 1315     Subjective  I have been doing my exercises - my elbow is so much looser in the am and later in day gets tighter- I could reach my face , R ear and fasten my top snap on shirt today    Pertinent History Pt fell on 09/01/22 - seen at ED - refer to Ortho - diagnosis of RIGHT HUMERUS TRANSCONDYALR FRACTURE      POST-OPERATIVE DIAGNOSIS:    1. RIGHT HUMERUS TRANSCONDYALR FRACTURE   2. RIGHT RADIAL HEAD FRACTURE     PROCEDURE:  Procedure(s):  1. OPEN REDUCTION  INTERNAL FIXATION (ORIF) DISTAL HUMERUS FRACTURE (Right)  2. OPEN TREATMENT OF RADIAL HEAD FRACTURE  3. REPAIR OF LATERAL ULNAR COLLATERAL LIGAMENT - Surgery on 09/20/22 and refer to OT to initiate ROM    Patient Stated Goals I want my motion and strenght back so I can stand on my hands and head in yoga again, lift weigths    Currently in Pain? Yes    Pain Score 2     Pain Location --   elbow   Pain Orientation Right    Pain Descriptors / Indicators Aching;Tightness    Pain Type Surgical pain    Pain Onset More than a month ago              Patient arrive with reports of increased motion in the morning mostly in the evenings.  Patient able to wash face.  Touch of right ear as well as fastening snaps on chart at neck.             OT Treatments/Exercises (OP) - 10/18/22 0001       Moist Heat Therapy   Number Minutes Moist Heat 8 Minutes    Moist Heat Location Elbow   prior to ROM  In supine with heating pad on arm for light elbow extension stretch Soft tissue mobs using graston tool nr 2 sweeping over volar and dorsal upper arm, bicep  tricep and forearm as well as manual by OT over bicep, upper arm and forearm - myofascial release Mini massager used on scar    AAROM in supine for elbow flexion , ext - 3 position As  well as over head elbow ext reaching 10reps Some active range of motion for shoulder horizontal abduction and adduction as well as D1-D2 pattern AROM pain less than a 2/10 in supine 10 reps  Pt to do 3 x day to do contrast  prior to scar massage and AROM and AAROM for elbow flexion, ext in supine , standing on wall pain free  Continue with use of cica scar pad night time  Tubi grip E for light compression for edema control  Pt to keep pain under 2/10         OT Education - 10/18/22 1315     Education Details progress and ROM    Person(s) Educated Patient    Methods Explanation;Demonstration;Tactile cues;Handout    Comprehension Verbal  cues required;Returned demonstration;Verbalized understanding                 OT Long Term Goals - 10/09/22 1932       OT LONG TERM GOAL #1   Title Patient to be independent in home program to initiate him massage to increase flexion extension and right arm today to feed self.    Baseline No knowledge of scar management as well as elbow flat on 112 and extend to -40.  With and pain 4/10 at elbow.    Time 3    Period Weeks    Status New    Target Date 10/30/22      OT LONG TERM GOAL #2   Title Right elbow flexion increased for patient to be able to wash hair and make ponytail and put earrings on without increase symptoms.    Baseline Right elbow flexion 112 degrees with some discomfort.    Time 6    Period Weeks    Status New    Target Date 11/20/22      OT LONG TERM GOAL #3   Title Right elbow extension and 2-10 for patient to reach head as well as into pocket without increase symptoms.  It    Baseline Right elbow extension -40.  Did not initiate any scar management as well as home exercise program to half weeks postop.    Time 6    Period Weeks    Status New    Target Date 11/20/22      OT LONG TERM GOAL #4   Title Strength in right elbow improved patient to be able to push and pull heavy door as well as can really more than 5 pounds without increase symptoms    Baseline Patient 2-1/2 weeks postop no strengthening only active range of motion.  Scar management and initiated pain 4/10 with active range of motion.    Time 8    Period Weeks    Status New    Target Date 12/04/22                   Plan - 10/18/22 1316     Clinical Impression Statement Patient present at OT evaluation with a right Transcondylar humeral distal fracture with ORIF.  With ulnar collateral ligament repair.  At surgery was done  09/20/2022.  Patient with increased scar tissue, increase edema with decreased flexion and extension of elbow.  Is right-hand dominant.  Pt arrive with increase  elbow flexion and extention this date compare to last week. Pt to cont with scar massage and use of Cica -Care scar pad at nighttime.  With use of Tubigrip D to decrease edema on elbow.  Patient's wrist range of motion as well as forearm WNL.  Elbow flexion improve to 135  and extension  -20 degrees ( in session improve to -15)  Patient to cont with contrast with using heating pad for light stretch for elbow extension. A ROM and light pain free AAROM for elbow flexion/ extension in a pain-free range in all 3 positions palm up and neutral and palm down.  Patient was very active prior to fall doing yoga as well as weights and walking 3-1/2 miles a day.  Reinforced with patient she is only 4 wks out from surgery-  focus on scar massage, contrast,  active range of motion.  Patient limited in functional use of right dominant hand and arm in ADLs and IADLs.  Patient can benefit from skilled OT services decrease edema and scar tissue as well as pain and motion and strength to return to prior level of motion.  At    OT Occupational Profile and History Problem Focused Assessment - Including review of records relating to presenting problem    Occupational performance deficits (Please refer to evaluation for details): ADL's;IADL's;Play;Social Participation;Leisure    Body Structure / Function / Physical Skills ADL;Edema;Decreased knowledge of precautions;Flexibility;IADL;Pain;Strength;Scar mobility;ROM;UE functional use    Rehab Potential Good    Clinical Decision Making Limited treatment options, no task modification necessary    Comorbidities Affecting Occupational Performance: None    Modification or Assistance to Complete Evaluation  No modification of tasks or assist necessary to complete eval    OT Frequency 2x / week    OT Duration 8 weeks    OT Treatment/Interventions Self-care/ADL training;Moist Heat;Paraffin;Contrast Bath;Therapeutic exercise;Manual Therapy;Passive range of motion;Scar  mobilization;Splinting;Patient/family education    Consulted and Agree with Plan of Care Patient             Patient will benefit from skilled therapeutic intervention in order to improve the following deficits and impairments:   Body Structure / Function / Physical Skills: ADL, Edema, Decreased knowledge of precautions, Flexibility, IADL, Pain, Strength, Scar mobility, ROM, UE functional use       Visit Diagnosis: Pain in right elbow  Stiffness of right elbow, not elsewhere classified  Muscle weakness (generalized)  Scar condition and fibrosis of skin    Problem List Patient Active Problem List   Diagnosis Date Noted   CAD (coronary artery disease) 03/08/2022   Prediabetes 01/27/2021   Palpitations 01/27/2021   Left axillary pain 12/21/2019   Osteoporosis 12/21/2019   Fatigue 12/21/2019   Familial hyperlipidemia 11/10/2019   Positive ANA (antinuclear antibody) 11/10/2019   Anxiety 11/10/2019   Murmur 11/10/2019    Rosalyn Gess, OTR/L,CLT 10/18/2022, 7:46 PM  [facility] [address], [place], [address] Phone: [phone]   Fax:  [phone]  Name: Nielle Mcrobie MRN: [id number] Date of Birth: 1945-12-29

## 2022-10-23 ENCOUNTER — Ambulatory Visit: Payer: Medicare Other | Attending: Orthopedic Surgery | Admitting: Occupational Therapy

## 2022-10-23 DIAGNOSIS — M6281 Muscle weakness (generalized): Secondary | ICD-10-CM | POA: Insufficient documentation

## 2022-10-23 DIAGNOSIS — M25521 Pain in right elbow: Secondary | ICD-10-CM | POA: Diagnosis not present

## 2022-10-23 DIAGNOSIS — L905 Scar conditions and fibrosis of skin: Secondary | ICD-10-CM | POA: Diagnosis present

## 2022-10-23 DIAGNOSIS — M25621 Stiffness of right elbow, not elsewhere classified: Secondary | ICD-10-CM | POA: Insufficient documentation

## 2022-10-23 NOTE — Therapy (Signed)
[facility] [address] [place], [address] Phone: [phone]   Fax:  [phone]  Occupational Therapy Treatment  Patient Details  Name: Cynthia Collins MRN: [id number] Date of Birth: 1946/08/06 Referring Provider (OT): DR Marcelino Scot   Encounter Date: 10/23/2022   OT End of Session - 10/23/22 1329     Visit Number 4    Number of Visits 16    Date for OT Re-Evaluation 12/04/22    OT Start Time 1315    OT Stop Time 1400    OT Time Calculation (min) 45 min    Activity Tolerance Patient tolerated treatment well    Behavior During Therapy [facility] for tasks assessed/performed             Past Medical History:  Diagnosis Date   Allergy    Anxiety    Asthma    Complication of anesthesia    slow to wake up   Heart murmur    Hyperlipidemia    Paroxysmal SVT (supraventricular tachycardia)    Pre-diabetes    Prediabetes    Squamous cell carcinoma of skin 08/03/2021   Mid chest - [facility] ED&C 09/07/21    Past Surgical History:  Procedure Laterality Date   APPENDECTOMY  08/20/1968   CESAREAN SECTION  08/20/1984   OOPHORECTOMY Right 08/20/1968   ORIF HUMERUS FRACTURE Right 09/20/2022   Procedure: OPEN REDUCTION INTERNAL FIXATION (ORIF) DISTAL HUMERUS FRACTURE;  Surgeon: Altamese Bethel, MD;  Location: [place];  Service: Orthopedics;  Laterality: Right;   SKIN CANCER EXCISION      There were no vitals filed for this visit.   Subjective Assessment - 10/23/22 1327     Subjective  I got my neackless on today - took me a while- so little tight now from the fighting - worked a lot on straightening of my elbow this weekend    Pertinent History Pt fell on 09/01/22 - seen at ED - refer to Ortho - diagnosis of RIGHT HUMERUS [place] DIAGNOSIS:    1. RIGHT HUMERUS TRANSCONDYALR FRACTURE   2. RIGHT RADIAL HEAD FRACTURE     PROCEDURE:  Procedure(s):  1. OPEN REDUCTION INTERNAL FIXATION (ORIF) DISTAL  HUMERUS FRACTURE (Right)  2. OPEN TREATMENT OF RADIAL HEAD FRACTURE  3. REPAIR OF LATERAL ULNAR COLLATERAL LIGAMENT - Surgery on 09/20/22 and refer to OT to initiate ROM    Patient Stated Goals I want my motion and strenght back so I can stand on my hands and head in yoga again, lift weigths    Currently in Pain? Yes    Pain Score 2     Pain Location Elbow    Pain Orientation Right    Pain Descriptors / Indicators Tightness;Sore                [facility] OT Assessment - 10/23/22 0001       AROM   Right Elbow Flexion 135    Right Elbow Extension -20                      OT Treatments/Exercises (OP) - 10/23/22 0001       Moist Heat Therapy   Number Minutes Moist Heat 8 Minutes    Moist Heat Location Elbow   ext stretch prior to rom            n supine with heating pad on arm for light elbow extension stretch Soft  tissue mobs using graston tool nr 2 sweeping over volar and dorsal upper arm, bicep  tricep and forearm as well as manual by OT over bicep, upper arm and forearm - myofascial release Mini massager used on scar    AAROM in supine for elbow flexion , ext - 3 position As  well as over head elbow ext reaching 10reps Some active range of motion for shoulder horizontal abduction and adduction as well as D1-D2 pattern AROM pain less than a 2/10 in supine 10 reps  Pt to do 3 x day to do contrast  Interlocking today in supine - AAROM for shoulder flexion to neck L and R - and into extention   10 reps- light pull  Continue with use of cica scar pad night time  Tubi grip E for light compression for edema control  Pt to keep pain under 2/10        OT Education - 10/23/22 1328     Education Details progress and ROM    Person(s) Educated Patient    Methods Explanation;Demonstration;Tactile cues;Handout    Comprehension Verbal cues required;Returned demonstration;Verbalized understanding                 OT Long Term Goals - 10/09/22 1932       OT  LONG TERM GOAL #1   Title Patient to be independent in home program to initiate him massage to increase flexion extension and right arm today to feed self.    Baseline No knowledge of scar management as well as elbow flat on 112 and extend to -40.  With and pain 4/10 at elbow.    Time 3    Period Weeks    Status New    Target Date 10/30/22      OT LONG TERM GOAL #2   Title Right elbow flexion increased for patient to be able to wash hair and make ponytail and put earrings on without increase symptoms.    Baseline Right elbow flexion 112 degrees with some discomfort.    Time 6    Period Weeks    Status New    Target Date 11/20/22      OT LONG TERM GOAL #3   Title Right elbow extension and 2-10 for patient to reach head as well as into pocket without increase symptoms.  It    Baseline Right elbow extension -40.  Did not initiate any scar management as well as home exercise program to half weeks postop.    Time 6    Period Weeks    Status New    Target Date 11/20/22      OT LONG TERM GOAL #4   Title Strength in right elbow improved patient to be able to push and pull heavy door as well as can really more than 5 pounds without increase symptoms    Baseline Patient 2-1/2 weeks postop no strengthening only active range of motion.  Scar management and initiated pain 4/10 with active range of motion.    Time 8    Period Weeks    Status New    Target Date 12/04/22                   Plan - 10/23/22 1329     Clinical Impression Statement Patient present at OT evaluation with a right Transcondylar humeral distal fracture with ORIF.  With ulnar collateral ligament repair.  At surgery was done 09/20/2022. Pt is 41/2 wks s/p - made great progress  using moist heat , soft tissue, scar massage and AROM - initiated light AAROM this date interlocking AROM - pt reinforce not to do internal and external rotation - straight plane. Keeping pain under 2/10. Pt to cont with scar massage and use of  Cica -Care scar pad at nighttime.  With use of Tubigrip D to decrease edema on elbow.  Patient's wrist range of motion as well as forearm WNL.  Elbow flexion improve to 135  and extension  -20 to -15 degrees in session. Patient to cont with contrast with using heating pad for light stretch for elbow extension. Patient was very active prior to fall doing yoga, hand/head stands as well as weights and walking 3-1/2 miles a day.  Reinforced with patient she is only 4-5 wks out from surgery-  focus on scar massage, contrast,  active range of motion.  Patient limited in functional use of right dominant hand and arm in ADLs and IADLs.  Patient can benefit from skilled OT services decrease edema and scar tissue as well as pain and motion and strength to return to prior level of motion.  Pt has appt with surgon tomorrow.    OT Occupational Profile and History Problem Focused Assessment - Including review of records relating to presenting problem    Occupational performance deficits (Please refer to evaluation for details): ADL's;IADL's;Play;Social Participation;Leisure    Body Structure / Function / Physical Skills ADL;Edema;Decreased knowledge of precautions;Flexibility;IADL;Pain;Strength;Scar mobility;ROM;UE functional use    Rehab Potential Good    Clinical Decision Making Limited treatment options, no task modification necessary    Comorbidities Affecting Occupational Performance: None    Modification or Assistance to Complete Evaluation  No modification of tasks or assist necessary to complete eval    OT Frequency 2x / week    OT Duration 8 weeks    OT Treatment/Interventions Self-care/ADL training;Moist Heat;Paraffin;Contrast Bath;Therapeutic exercise;Manual Therapy;Passive range of motion;Scar mobilization;Splinting;Patient/family education    Consulted and Agree with Plan of Care Patient             Patient will benefit from skilled therapeutic intervention in order to improve the following  deficits and impairments:   Body Structure / Function / Physical Skills: ADL, Edema, Decreased knowledge of precautions, Flexibility, IADL, Pain, Strength, Scar mobility, ROM, UE functional use       Visit Diagnosis: Pain in right elbow  Stiffness of right elbow, not elsewhere classified  Muscle weakness (generalized)  Scar condition and fibrosis of skin    Problem List Patient Active Problem List   Diagnosis Date Noted   CAD (coronary artery disease) 03/08/2022   Prediabetes 01/27/2021   Palpitations 01/27/2021   Left axillary pain 12/21/2019   Osteoporosis 12/21/2019   Fatigue 12/21/2019   Familial hyperlipidemia 11/10/2019   Positive ANA (antinuclear antibody) 11/10/2019   Anxiety 11/10/2019   Murmur 11/10/2019    Rosalyn Gess, OTR/L,CLT 10/23/2022, 2:14 PM  [facility] [address] [place], [address] Phone: [phone]   Fax:  [phone]  Name: Cynthia Collins MRN: [id number] Date of Birth: [birth year]

## 2022-10-25 ENCOUNTER — Ambulatory Visit: Payer: Medicare Other | Admitting: Occupational Therapy

## 2022-10-25 DIAGNOSIS — M6281 Muscle weakness (generalized): Secondary | ICD-10-CM

## 2022-10-25 DIAGNOSIS — M25621 Stiffness of right elbow, not elsewhere classified: Secondary | ICD-10-CM

## 2022-10-25 DIAGNOSIS — M25521 Pain in right elbow: Secondary | ICD-10-CM | POA: Diagnosis not present

## 2022-10-25 DIAGNOSIS — L905 Scar conditions and fibrosis of skin: Secondary | ICD-10-CM

## 2022-10-25 NOTE — Therapy (Signed)
[facility] [address] [place], [address] Phone: [phone]   Fax:  [phone]  Occupational Therapy Treatment  Patient Details  Name: Cynthia Collins MRN: [id number] Date of Birth: Jun 07, 1946 Referring Provider (OT): DR Marcelino Scot   Encounter Date: 10/25/2022   OT End of Session - 10/25/22 1501     Visit Number 5    Number of Visits 16    Date for OT Re-Evaluation 12/04/22    OT Start Time [id number]    OT Stop Time 1532    OT Time Calculation (min) 45 min    Activity Tolerance Patient tolerated treatment well    Behavior During Therapy [facility] for tasks assessed/performed             Past Medical History:  Diagnosis Date   Allergy    Anxiety    Asthma    Complication of anesthesia    slow to wake up   Heart murmur    Hyperlipidemia    Paroxysmal SVT (supraventricular tachycardia)    Pre-diabetes    Prediabetes    Squamous cell carcinoma of skin 08/03/2021   Mid chest - [facility] ED&C 09/07/21    Past Surgical History:  Procedure Laterality Date   APPENDECTOMY  08/20/1968   CESAREAN SECTION  08/20/1984   OOPHORECTOMY Right 08/20/1968   ORIF HUMERUS FRACTURE Right 09/20/2022   Procedure: OPEN REDUCTION INTERNAL FIXATION (ORIF) DISTAL HUMERUS FRACTURE;  Surgeon: Altamese Park, MD;  Location: [place];  Service: Orthopedics;  Laterality: Right;   SKIN CANCER EXCISION      There were no vitals filed for this visit.   Subjective Assessment - 10/25/22 1501     Subjective  I seen Dr Marcelino Scot yesterday and he was happy - xray good -but told can work gradually up to 10 lbs but not yet- the drive was tiring so also little tight today    Pertinent History Pt fell on 09/01/22 - seen at ED - refer to Ortho - diagnosis of RIGHT HUMERUS [place] DIAGNOSIS:    1. RIGHT HUMERUS TRANSCONDYALR FRACTURE   2. RIGHT RADIAL HEAD FRACTURE     PROCEDURE:  Procedure(s):  1. OPEN REDUCTION INTERNAL FIXATION  (ORIF) DISTAL HUMERUS FRACTURE (Right)  2. OPEN TREATMENT OF RADIAL HEAD FRACTURE  3. REPAIR OF LATERAL ULNAR COLLATERAL LIGAMENT - Surgery on 09/20/22 and refer to OT to initiate ROM    Patient Stated Goals I want my motion and strenght back so I can stand on my hands and head in yoga again, lift weigths    Currently in Pain? No/denies                [facility] OT Assessment - 10/25/22 0001       AROM   Right Elbow Flexion 130    Right Elbow Extension -30                      OT Treatments/Exercises (OP) - 10/25/22 0001       Moist Heat Therapy   Number Minutes Moist Heat 8 Minutes    Moist Heat Location Elbow   extention -moist heat for ext stretch            In supine with heating pad on arm for light elbow extension stretch 3 cm keeping shoulder down and 1/2 lbs on forearm - light prolonged stretch Soft tissue mobs using graston tool nr 2 sweeping  over volar and dorsal upper arm, bicep  tricep and forearm as well as manual by OT over bicep, upper arm and forearm - myofascial release Mini massager used on scar   extention stretch in supine AAROM for 3 positions to mat 10 reps each   As  well as over head elbow ext reaching 10reps Some active range of motion for shoulder horizontal abduction and adduction as well as D1-D2 pattern AROM pain less than a 2/10 in supine 10 reps  Pt to do 3 x day to do contrast  Interlocking today in supine - AAROM for shoulder flexion to neck L and R - and into extention   10 reps- light pull   Continue with use of cica scar pad night time  Tubi grip E for light compression for edema control  Pt to keep pain under 2/10          OT Education - 10/25/22 1501     Education Details progress and ROM    Person(s) Educated Patient    Methods Explanation;Demonstration;Tactile cues;Handout    Comprehension Verbal cues required;Returned demonstration;Verbalized understanding                 OT Long Term Goals - 10/09/22  1932       OT LONG TERM GOAL #1   Title Patient to be independent in home program to initiate him massage to increase flexion extension and right arm today to feed self.    Baseline No knowledge of scar management as well as elbow flat on 112 and extend to -40.  With and pain 4/10 at elbow.    Time 3    Period Weeks    Status New    Target Date 10/30/22      OT LONG TERM GOAL #2   Title Right elbow flexion increased for patient to be able to wash hair and make ponytail and put earrings on without increase symptoms.    Baseline Right elbow flexion 112 degrees with some discomfort.    Time 6    Period Weeks    Status New    Target Date 11/20/22      OT LONG TERM GOAL #3   Title Right elbow extension and 2-10 for patient to reach head as well as into pocket without increase symptoms.  It    Baseline Right elbow extension -40.  Did not initiate any scar management as well as home exercise program to half weeks postop.    Time 6    Period Weeks    Status New    Target Date 11/20/22      OT LONG TERM GOAL #4   Title Strength in right elbow improved patient to be able to push and pull heavy door as well as can really more than 5 pounds without increase symptoms    Baseline Patient 2-1/2 weeks postop no strengthening only active range of motion.  Scar management and initiated pain 4/10 with active range of motion.    Time 8    Period Weeks    Status New    Target Date 12/04/22                   Plan - 10/25/22 1502     Clinical Impression Statement Patient present at OT evaluation with a right Transcondylar humeral distal fracture with ORIF.  With ulnar collateral ligament repair.  At surgery was done 09/20/2022. Pt is 5 wks s/p - made great progress using moist  heat , soft tissue, scar massage and AROM - initiated light AAROM this week interlocking AROM. Pt had follow up with Dr Marcelino Scot and xray looked good- to wait about 2 more wks with strengthening - Pt somewhat stiff coming  in this am - per pt from long day yesterday and driving. Remind again for pt to keep pain under 2/10. Pt to cont with scar massage and use of Cica -Care scar pad at nighttime.  With use of Tubigrip D to decrease edema on elbow.  Patient's wrist range of motion as well as forearm WNL.  Elbow flexion improve to 130  and extension  -25 degrees coming in. Patient to cont with contrast with using heating pad for light stretch for elbow extension. Patient was very active prior to fall doing yoga, hand/head stands as well as weights and walking 3-1/2 miles a day.  Reinforced with patient she is only 4-5 wks out from surgery-  focus on scar massage, contrast,  active range of motion.  Patient limited in functional use of right dominant hand and arm in ADLs and IADLs.  Patient can benefit from skilled OT services decrease edema and scar tissue as well as pain and motion and strength to return to prior level of motion.  Pt has appt with surgon tomorrow.    OT Occupational Profile and History Problem Focused Assessment - Including review of records relating to presenting problem    Occupational performance deficits (Please refer to evaluation for details): ADL's;IADL's;Play;Social Participation;Leisure    Body Structure / Function / Physical Skills ADL;Edema;Decreased knowledge of precautions;Flexibility;IADL;Pain;Strength;Scar mobility;ROM;UE functional use    Rehab Potential Good    Clinical Decision Making Limited treatment options, no task modification necessary    Comorbidities Affecting Occupational Performance: None    Modification or Assistance to Complete Evaluation  No modification of tasks or assist necessary to complete eval    OT Frequency 2x / week    OT Duration 6 weeks    OT Treatment/Interventions Self-care/ADL training;Moist Heat;Paraffin;Contrast Bath;Therapeutic exercise;Manual Therapy;Passive range of motion;Scar mobilization;Splinting;Patient/family education    Consulted and Agree with Plan of  Care Patient             Patient will benefit from skilled therapeutic intervention in order to improve the following deficits and impairments:   Body Structure / Function / Physical Skills: ADL, Edema, Decreased knowledge of precautions, Flexibility, IADL, Pain, Strength, Scar mobility, ROM, UE functional use       Visit Diagnosis: Pain in right elbow  Stiffness of right elbow, not elsewhere classified  Muscle weakness (generalized)  Scar condition and fibrosis of skin    Problem List Patient Active Problem List   Diagnosis Date Noted   CAD (coronary artery disease) 03/08/2022   Prediabetes 01/27/2021   Palpitations 01/27/2021   Left axillary pain 12/21/2019   Osteoporosis 12/21/2019   Fatigue 12/21/2019   Familial hyperlipidemia 11/10/2019   Positive ANA (antinuclear antibody) 11/10/2019   Anxiety 11/10/2019   Murmur 11/10/2019    Rosalyn Gess, OTR/L,CLT 10/25/2022, 10:00 PM  [facility] [address] [place], [address] Phone: [phone]   Fax:  [phone]  Name: Magaby Beegle MRN: [id number] Date of Birth: 14-Oct-1945

## 2022-10-30 ENCOUNTER — Ambulatory Visit: Payer: Medicare Other | Admitting: Occupational Therapy

## 2022-10-30 DIAGNOSIS — M25521 Pain in right elbow: Secondary | ICD-10-CM | POA: Diagnosis not present

## 2022-10-30 DIAGNOSIS — M25621 Stiffness of right elbow, not elsewhere classified: Secondary | ICD-10-CM

## 2022-10-30 DIAGNOSIS — M6281 Muscle weakness (generalized): Secondary | ICD-10-CM

## 2022-10-30 DIAGNOSIS — L905 Scar conditions and fibrosis of skin: Secondary | ICD-10-CM

## 2022-10-30 NOTE — Therapy (Signed)
[facility] [address], [place], [address] Phone: [phone]   Fax:  [phone]  Occupational Therapy Treatment  Patient Details  Name: Cynthia Collins MRN: [id number] Date of Birth: 07/29/1946 Referring Provider (OT): DR Marcelino Scot   Encounter Date: 10/30/2022   OT End of Session - 10/30/22 1358     Visit Number 6    Number of Visits 16    Date for OT Re-Evaluation 12/04/22    OT Start Time 1400    OT Stop Time 1455    OT Time Calculation (min) 55 min    Activity Tolerance Patient tolerated treatment well    Behavior During Therapy [facility] for tasks assessed/performed             Past Medical History:  Diagnosis Date   Allergy    Anxiety    Asthma    Complication of anesthesia    slow to wake up   Heart murmur    Hyperlipidemia    Paroxysmal SVT (supraventricular tachycardia)    Pre-diabetes    Prediabetes    Squamous cell carcinoma of skin 08/03/2021   Mid chest - [facility] ED&C 09/07/21    Past Surgical History:  Procedure Laterality Date   APPENDECTOMY  08/20/1968   CESAREAN SECTION  08/20/1984   OOPHORECTOMY Right 08/20/1968   ORIF HUMERUS FRACTURE Right 09/20/2022   Procedure: OPEN REDUCTION INTERNAL FIXATION (ORIF) DISTAL HUMERUS FRACTURE;  Surgeon: Altamese McLennan, MD;  Location: [facility];  Service: Orthopedics;  Laterality: Right;   SKIN CANCER EXCISION      There were no vitals filed for this visit.   Subjective Assessment - 10/30/22 1358     Subjective  I worked it hard on Friday and Sat it was sore- but I do the one on the floor and can feel progress- and bending I can touch my fingers behind my back now    Pertinent History Pt fell on 09/01/22 - seen at ED - refer to Ortho - diagnosis of RIGHT HUMERUS [place] DIAGNOSIS:    1. RIGHT HUMERUS TRANSCONDYALR FRACTURE   2. RIGHT RADIAL HEAD FRACTURE     PROCEDURE:  Procedure(s):  1. OPEN REDUCTION INTERNAL FIXATION  (ORIF) DISTAL HUMERUS FRACTURE (Right)  2. OPEN TREATMENT OF RADIAL HEAD FRACTURE  3. REPAIR OF LATERAL ULNAR COLLATERAL LIGAMENT - Surgery on 09/20/22 and refer to OT to initiate ROM    Patient Stated Goals I want my motion and strenght back so I can stand on my hands and head in yoga again, lift weigths    Currently in Pain? No/denies                [facility] OT Assessment - 10/30/22 0001       AROM   Right Elbow Flexion 140    Right Elbow Extension -10   0 in sup position             Pt arrive this date with 10 degrees of increase flexion and extention in R elbow Pt report working it over the weekend  Reinforce with pt she is ahead in progress with motion -and to keep it pain free   Pt report able to touch hand behind her back  Light stretch and no lateral loading on elbow       OT Treatments/Exercises (OP) - 10/30/22 0001       RUE Paraffin   Number Minutes Paraffin 8 Minutes  RUE Paraffin Location --   elbow   Comments extention stregch - heatingpad stretch            In supine with heating pad on arm for light elbow extension stretch 3 lbs keeping shoulder down and 1/2 lbs on forearm - light prolonged stretch Mini massager used on scar   extention stretch in supine AAROM for 3 positions to mat 10 reps each  Keeping pain under 2/10   As  well as over head elbow ext reaching 10reps Some active range of motion for shoulder horizontal abduction and adduction as well as D1-D2 pattern AROM pain less than a 2/10 in supine 10 reps  Pt to do 3 x day to do contrast  Interlocking today in supine - AAROM for shoulder flexion to neck L and R - and into extention   10 reps- light pull  Pt held 1/4 lbs for elbow extention over head -and from hip to punching up - in supine  12 reps Pain free  Pt had med elbow click - end range extention - pain free - but fitted with tubigrip for compression And pt to not push end range  Cont to assess   Continue with use of cica  scar pad night time  Tubi grip D for light compression for edema control  Pt to keep pain under 2/10         OT Education - 10/30/22 1358     Education Details progress and ROM    Person(s) Educated Patient    Methods Explanation;Demonstration;Tactile cues;Handout    Comprehension Verbal cues required;Returned demonstration;Verbalized understanding                 OT Long Term Goals - 10/09/22 1932       OT LONG TERM GOAL #1   Title Patient to be independent in home program to initiate him massage to increase flexion extension and right arm today to feed self.    Baseline No knowledge of scar management as well as elbow flat on 112 and extend to -40.  With and pain 4/10 at elbow.    Time 3    Period Weeks    Status New    Target Date 10/30/22      OT LONG TERM GOAL #2   Title Right elbow flexion increased for patient to be able to wash hair and make ponytail and put earrings on without increase symptoms.    Baseline Right elbow flexion 112 degrees with some discomfort.    Time 6    Period Weeks    Status New    Target Date 11/20/22      OT LONG TERM GOAL #3   Title Right elbow extension and 2-10 for patient to reach head as well as into pocket without increase symptoms.  It    Baseline Right elbow extension -40.  Did not initiate any scar management as well as home exercise program to half weeks postop.    Time 6    Period Weeks    Status New    Target Date 11/20/22      OT LONG TERM GOAL #4   Title Strength in right elbow improved patient to be able to push and pull heavy door as well as can really more than 5 pounds without increase symptoms    Baseline Patient 2-1/2 weeks postop no strengthening only active range of motion.  Scar management and initiated pain 4/10 with active range of motion.  Time 8    Period Weeks    Status New    Target Date 12/04/22                   Plan - 10/30/22 1359     Clinical Impression Statement Patient  present at OT evaluation with a right Transcondylar humeral distal fracture with ORIF.  With ulnar collateral ligament repair.  At surgery was done 09/20/2022. Pt is 5 1/2 wks s/p -  Pt coming in today with great progress over weekend in elbow extention to -10 to 0 degrees. Flexion 140 degrees. Last week pt followed up with Dr Marcelino Scot and xray looked good.  Remind again for pt to keep pain under 2/10. Pt to cont with scar massage and use of Cica -Care scar pad at nighttime.  Cut pt new Tubigrip D to decrease edema on elbow.  Patient's wrist range of motion as well as forearm WNL.  Pt to cont with contrast with using heating pad for light stretch for elbow extension. This date had pt hold 1/4 lbs for punching up to ceiling and elbow extention with shoulder flex at 90. Tolerated well and no pain but pt had click on medial side of elbow on ulnar side of elbow at end range elbow extention. Pt to wear tubigrip D for compression and not push into end range actively. Also pt to keep HEP in straight plain.   Patient was very active prior to fall doing yoga, hand/head stands as well as weights and walking 3-1/2 miles a day.  Reinforced with patient she is only 5  1/2 wks out from surgery and making great progress in ROM-  focus on scar massage, contrast, A and AAROM.  Patient limited in functional use of right dominant hand and arm in ADLs and IADLs.  Patient can benefit from skilled OT services decrease edema and scar tissue as well as pain and motion and strength to return to prior level of motion.  Pt has appt with surgon tomorrow.    OT Occupational Profile and History Problem Focused Assessment - Including review of records relating to presenting problem    Occupational performance deficits (Please refer to evaluation for details): ADL's;IADL's;Play;Social Participation;Leisure    Body Structure / Function / Physical Skills ADL;Edema;Decreased knowledge of precautions;Flexibility;IADL;Pain;Strength;Scar mobility;ROM;UE  functional use    Rehab Potential Good    Clinical Decision Making Limited treatment options, no task modification necessary    Comorbidities Affecting Occupational Performance: None    Modification or Assistance to Complete Evaluation  No modification of tasks or assist necessary to complete eval    OT Frequency 2x / week    OT Duration 6 weeks    OT Treatment/Interventions Self-care/ADL training;Moist Heat;Paraffin;Contrast Bath;Therapeutic exercise;Manual Therapy;Passive range of motion;Scar mobilization;Splinting;Patient/family education    Consulted and Agree with Plan of Care Patient             Patient will benefit from skilled therapeutic intervention in order to improve the following deficits and impairments:   Body Structure / Function / Physical Skills: ADL, Edema, Decreased knowledge of precautions, Flexibility, IADL, Pain, Strength, Scar mobility, ROM, UE functional use       Visit Diagnosis: Pain in right elbow  Stiffness of right elbow, not elsewhere classified  Muscle weakness (generalized)  Scar condition and fibrosis of skin    Problem List Patient Active Problem List   Diagnosis Date Noted   CAD (coronary artery disease) 03/08/2022   Prediabetes 01/27/2021   Palpitations 01/27/2021  Left axillary pain 12/21/2019   Osteoporosis 12/21/2019   Fatigue 12/21/2019   Familial hyperlipidemia 11/10/2019   Positive ANA (antinuclear antibody) 11/10/2019   Anxiety 11/10/2019   Murmur 11/10/2019    Rosalyn Gess, OTR/L,CLT 10/30/2022, 8:14 PM  [facility] [address], [place], [address] Phone: [phone]   Fax:  [phone]  Name: Cynthia Collins MRN: [id number] Date of Birth: Oct 19, 1945

## 2022-11-01 ENCOUNTER — Ambulatory Visit: Payer: Medicare Other | Admitting: Occupational Therapy

## 2022-11-01 DIAGNOSIS — M25521 Pain in right elbow: Secondary | ICD-10-CM

## 2022-11-01 DIAGNOSIS — M25621 Stiffness of right elbow, not elsewhere classified: Secondary | ICD-10-CM

## 2022-11-01 DIAGNOSIS — M6281 Muscle weakness (generalized): Secondary | ICD-10-CM

## 2022-11-01 DIAGNOSIS — L905 Scar conditions and fibrosis of skin: Secondary | ICD-10-CM

## 2022-11-01 NOTE — Therapy (Signed)
[facility] [address], [place], [address] Phone: [phone]   Fax:  [phone]  Occupational Therapy Treatment  Patient Details  Name: Cynthia Collins MRN: [id number] Date of Birth: [birth year] Referring Provider (OT): DR Marcelino Scot   Encounter Date: 11/01/2022   OT End of Session - 11/01/22 1400     Visit Number 7    Number of Visits 16    Date for OT Re-Evaluation 12/04/22    OT Start Time 1400    OT Stop Time 1444    OT Time Calculation (min) 44 min    Activity Tolerance Patient tolerated treatment well    Behavior During Therapy [facility] for tasks assessed/performed             Past Medical History:  Diagnosis Date   Allergy    Anxiety    Asthma    Complication of anesthesia    slow to wake up   Heart murmur    Hyperlipidemia    Paroxysmal SVT (supraventricular tachycardia)    Pre-diabetes    Prediabetes    Squamous cell carcinoma of skin 08/03/2021   Mid chest - [facility] ED&C 09/07/21    Past Surgical History:  Procedure Laterality Date   APPENDECTOMY  08/20/1968   CESAREAN SECTION  08/20/1984   OOPHORECTOMY Right 08/20/1968   ORIF HUMERUS FRACTURE Right 09/20/2022   Procedure: OPEN REDUCTION INTERNAL FIXATION (ORIF) DISTAL HUMERUS FRACTURE;  Surgeon: Altamese Valley Hi, MD;  Location: [facility];  Service: Orthopedics;  Laterality: Right;   SKIN CANCER EXCISION      There were no vitals filed for this visit.   Subjective Assessment - 11/01/22 1400     Subjective  I brought my little 1/2 lbs weight - elbow better - little pain at times around elbow    Pertinent History Pt fell on 09/01/22 - seen at ED - refer to Ortho - diagnosis of RIGHT HUMERUS [place] DIAGNOSIS:    1. RIGHT HUMERUS TRANSCONDYALR FRACTURE   2. RIGHT RADIAL HEAD FRACTURE     PROCEDURE:  Procedure(s):  1. OPEN REDUCTION INTERNAL FIXATION (ORIF) DISTAL HUMERUS FRACTURE (Right)  2. OPEN TREATMENT OF RADIAL HEAD  FRACTURE  3. REPAIR OF LATERAL ULNAR COLLATERAL LIGAMENT - Surgery on 09/20/22 and refer to OT to initiate ROM    Patient Stated Goals I want my motion and strenght back so I can stand on my hands and head in yoga again, lift weigths    Currently in Pain? Yes    Pain Score 1     Pain Location Elbow    Pain Orientation Right    Pain Descriptors / Indicators Tightness;Sore                    Pt arrive earlier this week  with 10 degrees of increase flexion and extention in R elbow to -0 to -10 extention  Pt report working it over the weekend - did hear pop on lateral epicondyle - pt no pain reported Reinforce with pt she is ahead in progress with motion -and to keep it pain free   Pt report able to touch hands behind her back  Light stretch and no lateral loading on elbow REINFORCE again with pt               In supine with heating pad on arm for light elbow extension stretch 3 lbs keeping shoulder down  - light  prolonged stretch   extention AAROM for 3 positions to mat 10 reps each Keeping pain under 2/10   As  well as over head elbow ext reaching 10reps 1 /2 lbs for elbow extention over head -and from hip to punching up - in supine  12 reps Pain free  Pt had med epicondyle click at end range if prox upper arm is blocked- and in last 10 degrees that she gained last weekend  Pt denies pain - not every rep -taping done to med and lat elbow today -  And pt to not push end range  Cont to assess   Continue with use of cica scar pad night time  Tubi grip D for light compression for edema control  Pt to keep pain under 2/10  1/2 for sup/pro - forearm supported , wrist flexion, ext and RD,UD 12 reps- 2 x day  Light easy putty - gripping 15 reps- 2 x day - painfree  And light grip             OT Treatments/Exercises (OP) - 11/01/22 0001       Moist Heat Therapy   Number Minutes Moist Heat 8 Minutes    Moist Heat Location Elbow   extention stretch prior to ROM                    OT Education - 11/01/22 1400     Education Details progress and ROM    Person(s) Educated Patient    Methods Explanation;Demonstration;Tactile cues;Handout    Comprehension Verbal cues required;Returned demonstration;Verbalized understanding                 OT Long Term Goals - 10/09/22 1932       OT LONG TERM GOAL #1   Title Patient to be independent in home program to initiate him massage to increase flexion extension and right arm today to feed self.    Baseline No knowledge of scar management as well as elbow flat on 112 and extend to -40.  With and pain 4/10 at elbow.    Time 3    Period Weeks    Status New    Target Date 10/30/22      OT LONG TERM GOAL #2   Title Right elbow flexion increased for patient to be able to wash hair and make ponytail and put earrings on without increase symptoms.    Baseline Right elbow flexion 112 degrees with some discomfort.    Time 6    Period Weeks    Status New    Target Date 11/20/22      OT LONG TERM GOAL #3   Title Right elbow extension and 2-10 for patient to reach head as well as into pocket without increase symptoms.  It    Baseline Right elbow extension -40.  Did not initiate any scar management as well as home exercise program to half weeks postop.    Time 6    Period Weeks    Status New    Target Date 11/20/22      OT LONG TERM GOAL #4   Title Strength in right elbow improved patient to be able to push and pull heavy door as well as can really more than 5 pounds without increase symptoms    Baseline Patient 2-1/2 weeks postop no strengthening only active range of motion.  Scar management and initiated pain 4/10 with active range of motion.    Time 8  Period Weeks    Status New    Target Date 12/04/22                   Plan - 11/01/22 1401     Clinical Impression Statement Patient present at OT evaluation with a right Transcondylar humeral distal fracture with ORIF.  With  ulnar collateral ligament repair.  At surgery was done 09/20/2022. Pt is 6 wks s/p -  Pt coming in last visit with great progress over weekend in elbow extention to -10 to 0 degrees. Flexion 140 degrees. Last week pt followed up with Dr Marcelino Scot and xray looked good.  Remind again for pt to keep pain under 2/10. Pt to cont with scar massage and use of Cica -Care scar pad at nighttime.  Cont Tubigrip D to decrease edema on elbow.  Patient's wrist range of motion as well as forearm WNL. Add this date 1/2 for wrist and sup/pro 10 reps pain free -and light blue putty gripping lightly 15 reps- all 2 x day painfree -  Pt to cont with contrast with using heating pad for light stretch for elbow extension.  Pt click on medial side of elbow at medial epicondyle still at end range elbow extention if upper arm block -but less - and no pain. Will cont to assess - kinesiotape done lat and medial from wrist from mid upper arm to mid ulnar forearm. Reinforce again with pt to  keep HEP in straight plain.   Patient was very active prior to fall doing yoga, hand/head stands as well as weights and walking 3-1/2 miles a day.  Reinforced with patient she is only 6wks out from surgery and making great progress in ROM-  focus on scar massage, contrast, A and AAROM.  Patient limited in functional use of right dominant hand and arm in ADLs and IADLs.  Patient can benefit from skilled OT services decrease edema and scar tissue as well as pain and motion and strength to return to prior level of motion.    OT Occupational Profile and History Problem Focused Assessment - Including review of records relating to presenting problem    Occupational performance deficits (Please refer to evaluation for details): ADL's;IADL's;Play;Social Participation;Leisure    Body Structure / Function / Physical Skills ADL;Edema;Decreased knowledge of precautions;Flexibility;IADL;Pain;Strength;Scar mobility;ROM;UE functional use    Rehab Potential Good    Clinical  Decision Making Limited treatment options, no task modification necessary    Comorbidities Affecting Occupational Performance: None    Modification or Assistance to Complete Evaluation  No modification of tasks or assist necessary to complete eval    OT Frequency 2x / week    OT Duration 6 weeks    OT Treatment/Interventions Self-care/ADL training;Moist Heat;Paraffin;Contrast Bath;Therapeutic exercise;Manual Therapy;Passive range of motion;Scar mobilization;Splinting;Patient/family education    Consulted and Agree with Plan of Care Patient             Patient will benefit from skilled therapeutic intervention in order to improve the following deficits and impairments:   Body Structure / Function / Physical Skills: ADL, Edema, Decreased knowledge of precautions, Flexibility, IADL, Pain, Strength, Scar mobility, ROM, UE functional use       Visit Diagnosis: Pain in right elbow  Stiffness of right elbow, not elsewhere classified  Muscle weakness (generalized)  Scar condition and fibrosis of skin    Problem List Patient Active Problem List   Diagnosis Date Noted   CAD (coronary artery disease) 03/08/2022   Prediabetes 01/27/2021   Palpitations  01/27/2021   Left axillary pain 12/21/2019   Osteoporosis 12/21/2019   Fatigue 12/21/2019   Familial hyperlipidemia 11/10/2019   Positive ANA (antinuclear antibody) 11/10/2019   Anxiety 11/10/2019   Murmur 11/10/2019    Rosalyn Gess, OTR/L,CLT 11/01/2022, 4:08 PM  [facility] [address] [place], [address] Phone: [phone]   Fax:  [phone]  Name: Emberlin Hower MRN: [id number] Date of Birth: 1946-07-28

## 2022-11-06 ENCOUNTER — Ambulatory Visit: Payer: Medicare Other | Admitting: Occupational Therapy

## 2022-11-06 ENCOUNTER — Encounter: Payer: Self-pay | Admitting: Occupational Therapy

## 2022-11-06 DIAGNOSIS — M25621 Stiffness of right elbow, not elsewhere classified: Secondary | ICD-10-CM

## 2022-11-06 DIAGNOSIS — L905 Scar conditions and fibrosis of skin: Secondary | ICD-10-CM

## 2022-11-06 DIAGNOSIS — M25521 Pain in right elbow: Secondary | ICD-10-CM

## 2022-11-06 DIAGNOSIS — M6281 Muscle weakness (generalized): Secondary | ICD-10-CM

## 2022-11-06 NOTE — Therapy (Signed)
[facility] [address], [place], [address] Phone: [phone]   Fax:  [phone]  Occupational Therapy Treatment  Patient Details  Name: Cynthia Collins MRN: [id number] Date of Birth: 1946/07/18 Referring Provider (OT): DR Marcelino Scot   Encounter Date: 11/06/2022   OT End of Session - 11/06/22 1358     Visit Number 8    Number of Visits 16    Date for OT Re-Evaluation 12/04/22    OT Start Time [id number]    OT Stop Time 1445    OT Time Calculation (min) 47 min    Activity Tolerance Patient tolerated treatment well    Behavior During Therapy [facility] for tasks assessed/performed             Past Medical History:  Diagnosis Date   Allergy    Anxiety    Asthma    Complication of anesthesia    slow to wake up   Heart murmur    Hyperlipidemia    Paroxysmal SVT (supraventricular tachycardia)    Pre-diabetes    Prediabetes    Squamous cell carcinoma of skin 08/03/2021   Mid chest - [facility] ED&C 09/07/21    Past Surgical History:  Procedure Laterality Date   APPENDECTOMY  08/20/1968   CESAREAN SECTION  08/20/1984   OOPHORECTOMY Right 08/20/1968   ORIF HUMERUS FRACTURE Right 09/20/2022   Procedure: OPEN REDUCTION INTERNAL FIXATION (ORIF) DISTAL HUMERUS FRACTURE;  Surgeon: Altamese [place], MD;  Location: [facility];  Service: Orthopedics;  Laterality: Right;   SKIN CANCER EXCISION      There were no vitals filed for this visit.   Subjective Assessment - 11/06/22 1358     Subjective  I was sore on Friday but then Sat and Sunday I gave my arm a good work out - love the putty    Pertinent History Pt fell on 09/01/22 - seen at ED - refer to Ortho - diagnosis of RIGHT HUMERUS [place] DIAGNOSIS:    1. RIGHT HUMERUS TRANSCONDYALR FRACTURE   2. RIGHT RADIAL HEAD FRACTURE     PROCEDURE:  Procedure(s):  1. OPEN REDUCTION INTERNAL FIXATION (ORIF) DISTAL HUMERUS FRACTURE (Right)  2. OPEN TREATMENT OF  RADIAL HEAD FRACTURE  3. REPAIR OF LATERAL ULNAR COLLATERAL LIGAMENT - Surgery on 09/20/22 and refer to OT to initiate ROM    Patient Stated Goals I want my motion and strenght back so I can stand on my hands and head in yoga again, lift weigths    Currently in Pain? No/denies                [facility] OT Assessment - 11/06/22 0001       AROM   Right Elbow Flexion 140    Right Elbow Extension -15      Strength   Right Hand Grip (lbs) 24    Right Hand Lateral Pinch 10 lbs    Right Hand 3 Point Pinch 8 lbs    Left Hand Grip (lbs) 39    Left Hand Lateral Pinch 9 lbs    Left Hand 3 Point Pinch 10 lbs              Measurements taken for grip and prehension as well as elbow flexion extension.  See flowsheet        OT Treatments/Exercises (OP) - 11/06/22 0001       Moist Heat Therapy   Number Minutes Moist Heat 8  Minutes    Moist Heat Location Elbow   prior to ROM and ther ex           Soft tissue massage as well as scar massage done with light gentle manual to elbow for extension. Patient continues to have some swelling at the elbow. Active range of motion for elbow flexion extension 8 reps in all planes. Yellow Thera-Band initiated for elbow extension against door in neutral and pronation position with pillow behind upper arm 12 reps symptom-free Followed by yellow Thera-Band pull downs from top of door for scapula 12 reps-patient needed minimal to moderate verbal cueing and tactile cueing to not elevate shoulder and protraction. Had patient use left hand to compare had more ease afterwards. Bilateral scapular retraction with rowing yellow Thera-Band 12 reps Light bicep curl for elbow flexion in standing and supination in neutral position 10 reps pain-free not to strain. Patient can continue with half a pound for wrist and forearm pain-free As well as putty same resistance gripping. Patient to do home exercises twice a day.        OT Education - 11/06/22 1358      Education Details progress and changes in HEP    Person(s) Educated Patient    Methods Explanation;Demonstration;Tactile cues;Handout    Comprehension Verbal cues required;Returned demonstration;Verbalized understanding                 OT Long Term Goals - 10/09/22 1932       OT LONG TERM GOAL #1   Title Patient to be independent in home program to initiate him massage to increase flexion extension and right arm today to feed self.    Baseline No knowledge of scar management as well as elbow flat on 112 and extend to -40.  With and pain 4/10 at elbow.    Time 3    Period Weeks    Status New    Target Date 10/30/22      OT LONG TERM GOAL #2   Title Right elbow flexion increased for patient to be able to wash hair and make ponytail and put earrings on without increase symptoms.    Baseline Right elbow flexion 112 degrees with some discomfort.    Time 6    Period Weeks    Status New    Target Date 11/20/22      OT LONG TERM GOAL #3   Title Right elbow extension and 2-10 for patient to reach head as well as into pocket without increase symptoms.  It    Baseline Right elbow extension -40.  Did not initiate any scar management as well as home exercise program to half weeks postop.    Time 6    Period Weeks    Status New    Target Date 11/20/22      OT LONG TERM GOAL #4   Title Strength in right elbow improved patient to be able to push and pull heavy door as well as can really more than 5 pounds without increase symptoms    Baseline Patient 2-1/2 weeks postop no strengthening only active range of motion.  Scar management and initiated pain 4/10 with active range of motion.    Time 8    Period Weeks    Status New    Target Date 12/04/22                   Plan - 11/06/22 1359     Clinical Impression Statement Patient present at  OT evaluation with a right Transcondylar humeral distal fracture with ORIF.  With ulnar collateral ligament repair.  At surgery was  done 09/20/2022. Pt is 6 1/2 wks s/p -  Pt show elbow flexion 140 and ext -15 coming in - initiated YTB for elbow ext, flexion and scapula this date - tolerate well and no clicking or popping at elbow. Pt to do 2 x day 12 reps of each and same putty for gripping. and 1/2 weight for wrist- if no increase pain will increase wrist and forearm to 1 lbs and increase reps and sets for elbow /scapula. Pt need min v/c and mod t/c for shoulder position with elbow and scapula position.  Reinforce again with pt to  keep HEP in straight plain.   Patient was very active prior to fall doing yoga, hand/head stands as well as weights and walking 3-1/2 miles a day.  Patient limited in functional use of right dominant hand and arm in ADLs and IADLs.  Patient can benefit from skilled OT services decrease edema and scar tissue as well as pain and motion and strength to return to prior level of motion.    OT Occupational Profile and History Problem Focused Assessment - Including review of records relating to presenting problem    Occupational performance deficits (Please refer to evaluation for details): ADL's;IADL's;Play;Social Participation;Leisure    Body Structure / Function / Physical Skills ADL;Edema;Decreased knowledge of precautions;Flexibility;IADL;Pain;Strength;Scar mobility;ROM;UE functional use    Rehab Potential Good    Clinical Decision Making Limited treatment options, no task modification necessary    Comorbidities Affecting Occupational Performance: None    Modification or Assistance to Complete Evaluation  No modification of tasks or assist necessary to complete eval    OT Frequency 2x / week    OT Duration 6 weeks    OT Treatment/Interventions Self-care/ADL training;Moist Heat;Paraffin;Contrast Bath;Therapeutic exercise;Manual Therapy;Passive range of motion;Scar mobilization;Splinting;Patient/family education    Consulted and Agree with Plan of Care Patient             Patient will benefit from  skilled therapeutic intervention in order to improve the following deficits and impairments:   Body Structure / Function / Physical Skills: ADL, Edema, Decreased knowledge of precautions, Flexibility, IADL, Pain, Strength, Scar mobility, ROM, UE functional use       Visit Diagnosis: Pain in right elbow  Stiffness of right elbow, not elsewhere classified  Muscle weakness (generalized)  Scar condition and fibrosis of skin    Problem List Patient Active Problem List   Diagnosis Date Noted   CAD (coronary artery disease) 03/08/2022   Prediabetes 01/27/2021   Palpitations 01/27/2021   Left axillary pain 12/21/2019   Osteoporosis 12/21/2019   Fatigue 12/21/2019   Familial hyperlipidemia 11/10/2019   Positive ANA (antinuclear antibody) 11/10/2019   Anxiety 11/10/2019   Murmur 11/10/2019    Rosalyn Gess, OTR/L,CLT 11/06/2022, 3:05 PM  [facility] [address], [place], [address] Phone: [phone]   Fax:  [phone]  Name: Cynthia Collins MRN: [id number] Date of Birth: 1946-05-22

## 2022-11-08 ENCOUNTER — Ambulatory Visit: Payer: Medicare Other | Admitting: Occupational Therapy

## 2022-11-08 DIAGNOSIS — M6281 Muscle weakness (generalized): Secondary | ICD-10-CM

## 2022-11-08 DIAGNOSIS — M25521 Pain in right elbow: Secondary | ICD-10-CM

## 2022-11-08 DIAGNOSIS — M25621 Stiffness of right elbow, not elsewhere classified: Secondary | ICD-10-CM

## 2022-11-08 DIAGNOSIS — L905 Scar conditions and fibrosis of skin: Secondary | ICD-10-CM

## 2022-11-08 NOTE — Therapy (Signed)
[facility] [address] [place], [address] Phone: [phone]   Fax:  [phone]  Occupational Therapy Treatment  Patient Details  Name: Cynthia Collins MRN: [id number] Date of Birth: [birth year] Referring Provider (OT): DR Marcelino Scot   Encounter Date: 11/08/2022   OT End of Session - 11/08/22 1426     Visit Number 9    Number of Visits 16    Date for OT Re-Evaluation 12/04/22    OT Start Time 1410    OT Stop Time 1448    OT Time Calculation (min) 38 min    Activity Tolerance Patient tolerated treatment well    Behavior During Therapy [facility] for tasks assessed/performed             Past Medical History:  Diagnosis Date   Allergy    Anxiety    Asthma    Complication of anesthesia    slow to wake up   Heart murmur    Hyperlipidemia    Paroxysmal SVT (supraventricular tachycardia)    Pre-diabetes    Prediabetes    Squamous cell carcinoma of skin 08/03/2021   Mid chest - [facility] ED&C 09/07/21    Past Surgical History:  Procedure Laterality Date   APPENDECTOMY  08/20/1968   CESAREAN SECTION  08/20/1984   OOPHORECTOMY Right 08/20/1968   ORIF HUMERUS FRACTURE Right 09/20/2022   Procedure: OPEN REDUCTION INTERNAL FIXATION (ORIF) DISTAL HUMERUS FRACTURE;  Surgeon: Altamese , MD;  Location: [facility];  Service: Orthopedics;  Laterality: Right;   SKIN CANCER EXCISION      There were no vitals filed for this visit.   Subjective Assessment - 11/08/22 1426     Subjective  I had some acheness and discomfort in my elbow - I also took the 1/2 lbs in my hands and simulated the biking - don't know I done to much    Pertinent History Pt fell on 09/01/22 - seen at ED - refer to Ortho - diagnosis of RIGHT HUMERUS [place] DIAGNOSIS:    1. RIGHT HUMERUS TRANSCONDYALR FRACTURE   2. RIGHT RADIAL HEAD FRACTURE     PROCEDURE:  Procedure(s):  1. OPEN REDUCTION INTERNAL FIXATION (ORIF) DISTAL  HUMERUS FRACTURE (Right)  2. OPEN TREATMENT OF RADIAL HEAD FRACTURE  3. REPAIR OF LATERAL ULNAR COLLATERAL LIGAMENT - Surgery on 09/20/22 and refer to OT to initiate ROM    Patient Stated Goals I want my motion and strenght back so I can stand on my hands and head in yoga again, lift weigths    Currently in Pain? Yes    Pain Score 1     Pain Location Elbow    Pain Orientation Right    Pain Descriptors / Indicators Discomfort    Pain Type Surgical pain    Pain Onset More than a month ago                [facility] OT Assessment - 11/08/22 0001       AROM   Right Elbow Flexion 140    Right Elbow Extension -15              Soft tissue massage -scar massage done with light gentle manual to elbow for extension. Patient continues to have some swelling at the elbow. Active range of motion for elbow flexion extension 8 reps in all planes. Yellow Thera-Band elbow extension against door in neutral and pronation position with towel behind upper  arm 12 reps symptom-free Pt can help with L hand and place and hold- DO NOT PUSH END RANGE  Followed by yellow Thera-Band pull downs from top of door for scapula 12 reps-patient needed minimal to moderate verbal cueing and tactile cueing to not elevate shoulder and protraction - as well as doing elbow ext  Bilateral scapular retraction with rowing yellow Thera-Band 12 reps  HOLD OFF ON Light bicep curl for elbow flexion in standing and supination in neutral position 10 reps pain-free not to strain.  Patient can continue with half a pound for wrist and forearm pain-free As well as putty -same resistance gripping. Patient to do home exercises twice a day.                  OT Education - 11/08/22 1506     Education Details progress and changes in HEP    Person(s) Educated Patient    Methods Explanation;Demonstration;Tactile cues;Handout    Comprehension Verbal cues required;Returned demonstration;Verbalized understanding                  OT Long Term Goals - 10/09/22 1932       OT LONG TERM GOAL #1   Title Patient to be independent in home program to initiate him massage to increase flexion extension and right arm today to feed self.    Baseline No knowledge of scar management as well as elbow flat on 112 and extend to -40.  With and pain 4/10 at elbow.    Time 3    Period Weeks    Status New    Target Date 10/30/22      OT LONG TERM GOAL #2   Title Right elbow flexion increased for patient to be able to wash hair and make ponytail and put earrings on without increase symptoms.    Baseline Right elbow flexion 112 degrees with some discomfort.    Time 6    Period Weeks    Status New    Target Date 11/20/22      OT LONG TERM GOAL #3   Title Right elbow extension and 2-10 for patient to reach head as well as into pocket without increase symptoms.  It    Baseline Right elbow extension -40.  Did not initiate any scar management as well as home exercise program to half weeks postop.    Time 6    Period Weeks    Status New    Target Date 11/20/22      OT LONG TERM GOAL #4   Title Strength in right elbow improved patient to be able to push and pull heavy door as well as can really more than 5 pounds without increase symptoms    Baseline Patient 2-1/2 weeks postop no strengthening only active range of motion.  Scar management and initiated pain 4/10 with active range of motion.    Time 8    Period Weeks    Status New    Target Date 12/04/22                   Plan - 11/08/22 1656     Clinical Impression Statement Patient present at OT evaluation with a right Transcondylar humeral distal fracture with ORIF.  With ulnar collateral ligament repair.  At surgery was done 09/20/2022. Pt is about 7 wks s/p -  Pt show elbow flexion 140 and ext -15 coming in - initiated YTB for elbow ext, flexion and scapula last session - tolerate  well and no clicking or popping at elbow. Pt arrive this date with reports  of increase discomfort at posterior elbow and achiness . REinforce with pt again not to push end range and over do exercises. Resistance to be in comfortable plane- not to push end range with bands - Review again and tolerate well - pt  to hold off on elbow flexion with YTB. Will assess if can increase reps and sets for elbow /scapula next session and 1 lbs for wrist /forearm. Pt need min v/c and mod t/c for shoulder position with elbow and scapula position.  Reinforce again with pt to  keep HEP in straight plain.   Patient was very active prior to fall doing yoga, hand/head stands as well as weights and walking 3-1/2 miles a day.  Patient limited in functional use of right dominant hand and arm in ADLs and IADLs.  Patient can benefit from skilled OT services decrease edema and scar tissue as well as pain and motion and strength to return to prior level of motion.    OT Occupational Profile and History Problem Focused Assessment - Including review of records relating to presenting problem    Occupational performance deficits (Please refer to evaluation for details): ADL's;IADL's;Play;Social Participation;Leisure    Body Structure / Function / Physical Skills ADL;Edema;Decreased knowledge of precautions;Flexibility;IADL;Pain;Strength;Scar mobility;ROM;UE functional use    Rehab Potential Good    Clinical Decision Making Limited treatment options, no task modification necessary    Comorbidities Affecting Occupational Performance: None    Modification or Assistance to Complete Evaluation  No modification of tasks or assist necessary to complete eval    OT Frequency 2x / week    OT Duration 6 weeks    OT Treatment/Interventions Self-care/ADL training;Moist Heat;Paraffin;Contrast Bath;Therapeutic exercise;Manual Therapy;Passive range of motion;Scar mobilization;Splinting;Patient/family education    Consulted and Agree with Plan of Care Patient             Patient will benefit from skilled therapeutic  intervention in order to improve the following deficits and impairments:   Body Structure / Function / Physical Skills: ADL, Edema, Decreased knowledge of precautions, Flexibility, IADL, Pain, Strength, Scar mobility, ROM, UE functional use       Visit Diagnosis: Pain in right elbow  Stiffness of right elbow, not elsewhere classified  Muscle weakness (generalized)  Scar condition and fibrosis of skin    Problem List Patient Active Problem List   Diagnosis Date Noted   CAD (coronary artery disease) 03/08/2022   Prediabetes 01/27/2021   Palpitations 01/27/2021   Left axillary pain 12/21/2019   Osteoporosis 12/21/2019   Fatigue 12/21/2019   Familial hyperlipidemia 11/10/2019   Positive ANA (antinuclear antibody) 11/10/2019   Anxiety 11/10/2019   Murmur 11/10/2019    Cynthia Collins, OTR/L,CLT 11/08/2022, 5:00 PM  [facility] [address] [place], [address] Phone: [phone]   Fax:  [phone]  Name: Nyaire Righetti MRN: [id number] Date of Birth: 10-Apr-1946

## 2022-11-13 ENCOUNTER — Ambulatory Visit: Payer: Medicare Other | Admitting: Occupational Therapy

## 2022-11-13 DIAGNOSIS — M25521 Pain in right elbow: Secondary | ICD-10-CM | POA: Diagnosis not present

## 2022-11-13 DIAGNOSIS — M6281 Muscle weakness (generalized): Secondary | ICD-10-CM

## 2022-11-13 DIAGNOSIS — L905 Scar conditions and fibrosis of skin: Secondary | ICD-10-CM

## 2022-11-13 DIAGNOSIS — M25621 Stiffness of right elbow, not elsewhere classified: Secondary | ICD-10-CM

## 2022-11-14 ENCOUNTER — Encounter: Payer: Self-pay | Admitting: Occupational Therapy

## 2022-11-14 NOTE — Therapy (Signed)
[facility] [address] [place], [address] Phone: [phone]   Fax:  [phone]  Occupational Therapy Treatment/Progress Update Reporting period from  10/09/2022 to 11/13/2022  Patient Details  Name: Cynthia Collins MRN: [id number] Date of Birth: 06-04-1946 Referring Provider (OT): DR Marcelino Scot   Encounter Date: 11/13/2022   OT End of Session - 11/14/22 0816     Visit Number 10    Number of Visits 16    Date for OT Re-Evaluation 12/04/22    OT Start Time 1315    OT Stop Time 1405    OT Time Calculation (min) 50 min    Activity Tolerance Patient tolerated treatment well    Behavior During Therapy [facility] for tasks assessed/performed             Past Medical History:  Diagnosis Date   Allergy    Anxiety    Asthma    Complication of anesthesia    slow to wake up   Heart murmur    Hyperlipidemia    Paroxysmal SVT (supraventricular tachycardia)    Pre-diabetes    Prediabetes    Squamous cell carcinoma of skin 08/03/2021   Mid chest - [facility] ED&C 09/07/21    Past Surgical History:  Procedure Laterality Date   APPENDECTOMY  08/20/1968   CESAREAN SECTION  08/20/1984   OOPHORECTOMY Right 08/20/1968   ORIF HUMERUS FRACTURE Right 09/20/2022   Procedure: OPEN REDUCTION INTERNAL FIXATION (ORIF) DISTAL HUMERUS FRACTURE;  Surgeon: Altamese Arrow Rock, MD;  Location: [facility];  Service: Orthopedics;  Laterality: Right;   SKIN CANCER EXCISION      There were no vitals filed for this visit.   Subjective Assessment - 11/14/22 0813     Subjective  Pt reports she did her new exercises with yellow theraband but reports she had an "incident" in which she was performing rowing exercise with the light yellow band and felt a strange sensation in her elbow and she is concerned she "messed" things up.    Pertinent History Pt fell on 09/01/22 - seen at ED - refer to Ortho - diagnosis of RIGHT HUMERUS TRANSCONDYALR FRACTURE       POST-OPERATIVE DIAGNOSIS:    1. RIGHT HUMERUS TRANSCONDYALR FRACTURE   2. RIGHT RADIAL HEAD FRACTURE     PROCEDURE:  Procedure(s):  1. OPEN REDUCTION INTERNAL FIXATION (ORIF) DISTAL HUMERUS FRACTURE (Right)  2. OPEN TREATMENT OF RADIAL HEAD FRACTURE  3. REPAIR OF LATERAL ULNAR COLLATERAL LIGAMENT - Surgery on 09/20/22 and refer to OT to initiate ROM    Patient Stated Goals I want my motion and strenght back so I can stand on my hands and head in yoga again, lift weigths    Pain Onset More than a month ago             Pt reports she does not have pain, she reports sensation of stiffness overall.    Pt reassessed with ROM of the arm and elbow secondary to pt reporting incident during resistive exercise at home.  Pt with no increased pain, motion remained in similar ranges from last session with elbow flexion to 140 degrees and extension slightly improved with -10 degrees.  She denied hearing any popping when incident happened, she described as a strange sensation but had difficulty putting into words.  She denied any increased pain following however she was afraid to try exercise again without knowing what happened and reports some increased edema in her elbow.  Discussed the use of theraband and although she was using the lightest resistance, she may have increased tension based on where she had her hand placement on the band which can affect and vary the level of tension produced.  She may have also been rotating her elbow when performing putting more stress on collateral ligaments but she felt she was trying hard to be conscious of this since last session and was trying to work more in straight line motions.  Will have her hold off on yellow theraband for the next few days and reassess again when she returns for next session.     Contrast:   Pt seen for the use of contrast for edema management to decrease edema,  pain, stiffness, improve tissue mobility and range of motion.  Performed for 11 mins  total to right elbow.    Manual Therapy:   Following contrast, pt seen for soft tissue massage -scar massage performed with light gentle manual to elbow for extension. Patient continues to have some swelling at the elbow, she feels it is slightly increased this week from last.    Therapeutic Exercises:   Following manual therapy, pt seen for AAROM and therapeutic exercises elbow flexion and extension, reaching to ear for simulated earring placement, overhead reach, bilateral reaching behind head.   Cues at the shoulder during exercises since she tends to compensate with bringing humeral head forward.  Shoulder retraction in supine with elbow extension stretch.   Extension stretch in supine for 3 positions, 10 reps each.   Elbow flexion to 140 degrees  Elbow extension to -10 degrees.    1/2# for sup/pro - forearm supported , wrist flexion, ext and RD,UD 12 reps- 2 x day     OT Education - 11/14/22 0816     Education Details progress and changes in HEP    Person(s) Educated Patient    Methods Explanation;Demonstration;Tactile cues;Handout    Comprehension Verbal cues required;Returned demonstration;Verbalized understanding                 OT Long Term Goals - 11/14/22 0818       OT LONG TERM GOAL #1   Title Patient to be independent in home program to initiate massage to increase flexion extension and right arm today to feed self.    Baseline No knowledge of scar management as well as elbow flat on 112 and extend to -40.  With and pain 4/10 at elbow.    Time 3    Period Weeks    Status Achieved    Target Date 10/30/22      OT LONG TERM GOAL #2   Title Right elbow flexion increased for patient to be able to wash hair and make ponytail and put earrings on without increase symptoms.    Baseline Right elbow flexion 112 degrees with some discomfort.  10th visit:  right elbow flexion to 140    Time 6    Period Weeks    Status On-going    Target Date 11/20/22      OT LONG  TERM GOAL #3   Title Right elbow extension and 2-10 for patient to reach head as well as into pocket without increase symptoms.  It    Baseline Right elbow extension -40.  Did not initiate any scar management as well as home exercise program to half weeks postop.  10th visit: Right elbow extension -10 today.    Time 6    Period Weeks    Status  Achieved    Target Date 11/20/22      OT LONG TERM GOAL #4   Title Strength in right elbow improved patient to be able to push and pull heavy door as well as can really more than 5 pounds without increase symptoms    Baseline Patient 2-1/2 weeks postop no strengthening only active range of motion.  Scar management and initiated pain 4/10 with active range of motion.  10th visit:  ongoing with strengthening    Time 8    Period Weeks    Status On-going    Target Date 12/04/22                   Plan - 11/14/22 0816     Clinical Impression Statement Patient present at OT evaluation with a right Transcondylar humeral distal fracture with ORIF.  With ulnar collateral ligament repair.  At surgery was done 09/20/2022. Pt is about 7 wks s/p -  Pt demonstrates elbow flexion 140 and ext -10 coming in.REinforce with pt again not to push end range and over do exercises.  Pt need min v/c and mod t/c for shoulder position with elbow and scapula position.  Reinforce again with pt to  keep HEP in straight plain.   Patient was very active prior to fall doing yoga, hand/head stands as well as weights and walking 3-1/2 miles a day.  Pt came in today concerned about performing new exercises with the band, she described performing rowing exercise in standing and while engaged in exercise she reports an "incidence" happened, she was not able to describe the sensation and denied any popping at the elbow but had some increased edema in the elbow afterwards.  It is unclear if the patient was holding the bands in a straight line plane of motion and if she was holding the band  closer creating increased resistance.  Reimplemented contrast for edema this date, paused use of bands to allow for decreased inflammation to occur.  She did not lose motion, denied any increased pain after issue.  We will reassess use of theraband next session.   Patient limited in functional use of right dominant hand and arm in ADLs and IADLs but has made significant progress in motion and use since evaluation.  Patient can benefit from skilled OT services decrease edema and scar tissue as well as pain and motion and strength to return to prior level of motion.    OT Occupational Profile and History Problem Focused Assessment - Including review of records relating to presenting problem    Occupational performance deficits (Please refer to evaluation for details): ADL's;IADL's;Play;Social Participation;Leisure    Body Structure / Function / Physical Skills ADL;Edema;Decreased knowledge of precautions;Flexibility;IADL;Pain;Strength;Scar mobility;ROM;UE functional use    Rehab Potential Good    Clinical Decision Making Limited treatment options, no task modification necessary    Comorbidities Affecting Occupational Performance: None    Modification or Assistance to Complete Evaluation  No modification of tasks or assist necessary to complete eval    OT Frequency 2x / week    OT Duration 6 weeks    OT Treatment/Interventions Self-care/ADL training;Moist Heat;Paraffin;Contrast Bath;Therapeutic exercise;Manual Therapy;Passive range of motion;Scar mobilization;Splinting;Patient/family education    Consulted and Agree with Plan of Care Patient             Patient will benefit from skilled therapeutic intervention in order to improve the following deficits and impairments:   Body Structure / Function / Physical Skills: ADL, Edema, Decreased knowledge of precautions,  Flexibility, IADL, Pain, Strength, Scar mobility, ROM, UE functional use       Visit Diagnosis: Pain in right elbow  Stiffness of  right elbow, not elsewhere classified  Muscle weakness (generalized)  Scar condition and fibrosis of skin    Problem List Patient Active Problem List   Diagnosis Date Noted   CAD (coronary artery disease) 03/08/2022   Prediabetes 01/27/2021   Palpitations 01/27/2021   Left axillary pain 12/21/2019   Osteoporosis 12/21/2019   Fatigue 12/21/2019   Familial hyperlipidemia 11/10/2019   Positive ANA (antinuclear antibody) 11/10/2019   Anxiety 11/10/2019   Murmur 11/10/2019   Hansika Leaming T Takeesha Isley, OTR/L, CLT  Jotham Ahn, OT 11/14/2022, 12:37 PM  [facility] [address] [place], [address] Phone: [phone]   Fax:  [phone]  Name: Knova Gelb MRN: [id number] Date of Birth: 09/10/1945

## 2022-11-15 ENCOUNTER — Ambulatory Visit: Payer: Medicare Other | Admitting: Occupational Therapy

## 2022-11-15 DIAGNOSIS — M25521 Pain in right elbow: Secondary | ICD-10-CM | POA: Diagnosis not present

## 2022-11-15 DIAGNOSIS — L905 Scar conditions and fibrosis of skin: Secondary | ICD-10-CM

## 2022-11-15 DIAGNOSIS — M6281 Muscle weakness (generalized): Secondary | ICD-10-CM

## 2022-11-15 DIAGNOSIS — M25621 Stiffness of right elbow, not elsewhere classified: Secondary | ICD-10-CM

## 2022-11-18 ENCOUNTER — Encounter: Payer: Self-pay | Admitting: Occupational Therapy

## 2022-11-18 NOTE — Therapy (Signed)
[facility] [address], [place], [address] Phone: [phone]   Fax:  [phone]  Occupational Therapy Treatment  Patient Details  Name: Cynthia Collins MRN: [id number] Date of Birth: 04/06/1946 Referring Provider (OT): DR Marcelino Scot   Encounter Date: 11/15/2022   OT End of Session - 11/18/22 2140     Visit Number 11    Number of Visits 16    Date for OT Re-Evaluation 12/04/22    OT Start Time 1315    OT Stop Time 1400    OT Time Calculation (min) 45 min    Activity Tolerance Patient tolerated treatment well    Behavior During Therapy [facility] for tasks assessed/performed             Past Medical History:  Diagnosis Date   Allergy    Anxiety    Asthma    Complication of anesthesia    slow to wake up   Heart murmur    Hyperlipidemia    Paroxysmal SVT (supraventricular tachycardia)    Pre-diabetes    Prediabetes    Squamous cell carcinoma of skin 08/03/2021   Mid chest - [facility] ED&C 09/07/21    Past Surgical History:  Procedure Laterality Date   APPENDECTOMY  08/20/1968   CESAREAN SECTION  08/20/1984   OOPHORECTOMY Right 08/20/1968   ORIF HUMERUS FRACTURE Right 09/20/2022   Procedure: OPEN REDUCTION INTERNAL FIXATION (ORIF) DISTAL HUMERUS FRACTURE;  Surgeon: Altamese [place], MD;  Location: [facility];  Service: Orthopedics;  Laterality: Right;   SKIN CANCER EXCISION      There were no vitals filed for this visit.   Subjective Assessment - 11/18/22 2139     Subjective  Pt reports she is doing better since last session.    Pertinent History Pt fell on 09/01/22 - seen at ED - refer to Ortho - diagnosis of RIGHT HUMERUS TRANSCONDYALR FRACTURE      POST-OPERATIVE DIAGNOSIS:    1. RIGHT HUMERUS TRANSCONDYALR FRACTURE   2. RIGHT RADIAL HEAD FRACTURE     PROCEDURE:  Procedure(s):  1. OPEN REDUCTION INTERNAL FIXATION (ORIF) DISTAL HUMERUS FRACTURE (Right)  2. OPEN TREATMENT OF RADIAL HEAD FRACTURE  3. REPAIR OF LATERAL  ULNAR COLLATERAL LIGAMENT - Surgery on 09/20/22 and refer to OT to initiate ROM    Patient Stated Goals I want my motion and strenght back so I can stand on my hands and head in yoga again, lift weigths    Currently in Pain? No/denies    Pain Score 0-No pain    Pain Onset More than a month ago            Contrast:   Pt seen for the use of contrast for edema management to decrease edema,  pain, stiffness, improve tissue mobility and range of motion.  Performed for 11 mins total to right elbow.     Manual Therapy:   Following contrast, pt seen for soft tissue massage -scar massage performed with light gentle manual to elbow for extension. Pt reports edema improved this date from last session with re implementation of contrast for edema management.   Therapeutic Exercises:   Following manual therapy, pt seen for AAROM and therapeutic exercises elbow flexion and extension, reaching to ear for simulated earring placement, overhead reach, bilateral reaching behind head.   Cues at the shoulder during exercises since she tends to compensate with bringing humeral head forward.  Shoulder retraction in supine with elbow extension stretch.   Extension  stretch in supine for 3 positions, 10 reps each.   Elbow flexion to 140 degrees  Elbow extension to -10 degrees.     increased to 1#  for sup/pro - forearm supported , wrist flexion, ext and RD,UD 10 reps- 2 x day   Reassessed yellow theraband exercises this date, cues for proper hand positioning on band and for proper form and technique.      OT Education - 11/18/22 2140     Education Details progress and changes in HEP    Person(s) Educated Patient    Methods Explanation;Demonstration;Tactile cues;Handout    Comprehension Verbal cues required;Returned demonstration;Verbalized understanding                 OT Long Term Goals - 11/14/22 0818       OT LONG TERM GOAL #1   Title Patient to be independent in home program to initiate  massage to increase flexion extension and right arm today to feed self.    Baseline No knowledge of scar management as well as elbow flat on 112 and extend to -40.  With and pain 4/10 at elbow.    Time 3    Period Weeks    Status Achieved    Target Date 10/30/22      OT LONG TERM GOAL #2   Title Right elbow flexion increased for patient to be able to wash hair and make ponytail and put earrings on without increase symptoms.    Baseline Right elbow flexion 112 degrees with some discomfort.  10th visit:  right elbow flexion to 140    Time 6    Period Weeks    Status On-going    Target Date 11/20/22      OT LONG TERM GOAL #3   Title Right elbow extension and 2-10 for patient to reach head as well as into pocket without increase symptoms.  It    Baseline Right elbow extension -40.  Did not initiate any scar management as well as home exercise program to half weeks postop.  10th visit: Right elbow extension -10 today.    Time 6    Period Weeks    Status Achieved    Target Date 11/20/22      OT LONG TERM GOAL #4   Title Strength in right elbow improved patient to be able to push and pull heavy door as well as can really more than 5 pounds without increase symptoms    Baseline Patient 2-1/2 weeks postop no strengthening only active range of motion.  Scar management and initiated pain 4/10 with active range of motion.  10th visit:  ongoing with strengthening    Time 8    Period Weeks    Status On-going    Target Date 12/04/22                   Plan - 11/18/22 2141     Clinical Impression Statement Patient present at OT evaluation with a right Transcondylar humeral distal fracture with ORIF.  With ulnar collateral ligament repair.  At surgery was done 09/20/2022. Pt is about 8 wks s/p -  Pt demonstrates elbow flexion 140 and ext -10 coming in.REinforce with pt again not to push end range and over do exercises.  Pt need min v/c and mod t/c for shoulder position with elbow and scapula  position.  Reinforce again with pt to  keep HEP in straight plane of motion   Patient was very active prior to fall  doing yoga, hand/head stands as well as weights and walking 3-1/2 miles a day. Reimplemented contrast for edema this week and resumed use of bands with reinstruction on form and technique.  Edema decreased since last session.  Pt continues to benefit from cues and reinstruction of proper technique with exercises.    Patient limited in functional use of right dominant hand and arm in ADLs and IADLs but has made significant progress in motion and use since evaluation.  Patient can benefit from skilled OT services decrease edema and scar tissue as well as pain and motion and strength to return to prior level of motion.    OT Occupational Profile and History Problem Focused Assessment - Including review of records relating to presenting problem    Occupational performance deficits (Please refer to evaluation for details): ADL's;IADL's;Play;Social Participation;Leisure    Body Structure / Function / Physical Skills ADL;Edema;Decreased knowledge of precautions;Flexibility;IADL;Pain;Strength;Scar mobility;ROM;UE functional use    Rehab Potential Good    Clinical Decision Making Limited treatment options, no task modification necessary    Comorbidities Affecting Occupational Performance: None    Modification or Assistance to Complete Evaluation  No modification of tasks or assist necessary to complete eval    OT Frequency 2x / week    OT Duration 6 weeks    OT Treatment/Interventions Self-care/ADL training;Moist Heat;Paraffin;Contrast Bath;Therapeutic exercise;Manual Therapy;Passive range of motion;Scar mobilization;Splinting;Patient/family education    Consulted and Agree with Plan of Care Patient             Patient will benefit from skilled therapeutic intervention in order to improve the following deficits and impairments:   Body Structure / Function / Physical Skills: ADL, Edema,  Decreased knowledge of precautions, Flexibility, IADL, Pain, Strength, Scar mobility, ROM, UE functional use       Visit Diagnosis: Pain in right elbow  Stiffness of right elbow, not elsewhere classified  Muscle weakness (generalized)  Scar condition and fibrosis of skin    Problem List Patient Active Problem List   Diagnosis Date Noted   CAD (coronary artery disease) 03/08/2022   Prediabetes 01/27/2021   Palpitations 01/27/2021   Left axillary pain 12/21/2019   Osteoporosis 12/21/2019   Fatigue 12/21/2019   Familial hyperlipidemia 11/10/2019   Positive ANA (antinuclear antibody) 11/10/2019   Anxiety 11/10/2019   Murmur 11/10/2019   Temica Righetti T Solstice Lastinger, OTR/L, CLT  Amara Manalang, OT 11/18/2022, 9:51 PM  [facility] [address] [place], [address] Phone: [phone]   Fax:  [phone]  Name: Ardelle Miracle MRN: [id number] Date of Birth: 1945/11/01

## 2022-11-20 ENCOUNTER — Ambulatory Visit: Payer: Medicare Other | Attending: Orthopedic Surgery | Admitting: Occupational Therapy

## 2022-11-20 DIAGNOSIS — M6281 Muscle weakness (generalized): Secondary | ICD-10-CM

## 2022-11-20 DIAGNOSIS — L905 Scar conditions and fibrosis of skin: Secondary | ICD-10-CM | POA: Diagnosis present

## 2022-11-20 DIAGNOSIS — M25621 Stiffness of right elbow, not elsewhere classified: Secondary | ICD-10-CM

## 2022-11-20 DIAGNOSIS — M25521 Pain in right elbow: Secondary | ICD-10-CM

## 2022-11-20 NOTE — Therapy (Signed)
[facility] [address], [place], [address] Phone: [phone]   Fax:  [phone]  Occupational Therapy Treatment  Patient Details  Name: Cynthia Collins MRN: [id number] Date of Birth: Jul 15, 1946 Referring Provider (OT): DR Marcelino Scot   Encounter Date: 11/20/2022   OT End of Session - 11/20/22 1606     Visit Number 12    Number of Visits 16    Date for OT Re-Evaluation 12/04/22    OT Start Time 1318    OT Stop Time 1406    OT Time Calculation (min) 48 min    Activity Tolerance Patient tolerated treatment well    Behavior During Therapy [facility] for tasks assessed/performed             Past Medical History:  Diagnosis Date   Allergy    Anxiety    Asthma    Complication of anesthesia    slow to wake up   Heart murmur    Hyperlipidemia    Paroxysmal SVT (supraventricular tachycardia)    Pre-diabetes    Prediabetes    Squamous cell carcinoma of skin 08/03/2021   Mid chest - [facility] ED&C 09/07/21    Past Surgical History:  Procedure Laterality Date   APPENDECTOMY  08/20/1968   CESAREAN SECTION  08/20/1984   OOPHORECTOMY Right 08/20/1968   ORIF HUMERUS FRACTURE Right 09/20/2022   Procedure: OPEN REDUCTION INTERNAL FIXATION (ORIF) DISTAL HUMERUS FRACTURE;  Surgeon: Altamese [place], MD;  Location: [facility];  Service: Orthopedics;  Laterality: Right;   SKIN CANCER EXCISION      There were no vitals filed for this visit.   Subjective Assessment - 11/20/22 1604     Subjective  I feel like I am going backwards - had some pain in the elbow 2 weekends ago after using my yellow band for rowing - and some swelling at times the last week or so while you were out of town - yesterday I gained some great motion and can touch under my ear    Pertinent History Pt fell on 09/01/22 - seen at ED - refer to Ortho - diagnosis of RIGHT HUMERUS [place] DIAGNOSIS:    1. RIGHT HUMERUS TRANSCONDYALR FRACTURE    2. RIGHT RADIAL HEAD FRACTURE     PROCEDURE:  Procedure(s):  1. OPEN REDUCTION INTERNAL FIXATION (ORIF) DISTAL HUMERUS FRACTURE (Right)  2. OPEN TREATMENT OF RADIAL HEAD FRACTURE  3. REPAIR OF LATERAL ULNAR COLLATERAL LIGAMENT - Surgery on 09/20/22 and refer to OT to initiate ROM    Patient Stated Goals I want my motion and strenght back so I can stand on my hands and head in yoga again, lift weigths    Currently in Pain? No/denies                     Patient continues to have some swelling at the elbow. Report had some pain the last 10 days one time after doing YTB for scapula retraction  And then she worked on elbow flexion motion and could go much further -but then still have some pain at times or edema Assess elbow flexion -ext  And then strength in wrist in all planes 4+/5 - except sup/pro 4/5 Pt fear for pain -or edema increase  Elbow strength 4-/5 for flexion, ext Pt to stop doing any thera band- pt pulling maybe to strong  Also not to push end range with weight -do motion first  And then weight Upgrade to 1 lbs for shoulder ABD 12 reps And elbow flexion with over head push - 12 reps - 1 lbs  Pain free -2x day  Support upper arm - elbow flexion ext 1lbs - palm up and thumb up 12 reps each  Painfree range  2x day  Supine elbow extention 1 lbs - block shoulder at 90 degrees flexoin  12 reps  Pain free  Sitting in chair - support forearm - 2 lbs increase for forearm and wrist  In all planes  Standing for RD and UD - but keep elbow stil  12 reps pain free  2 x day  UBE 4 min -                    OT Education - 11/20/22 1605     Education Details progress and changes in HEP - and not overdoing HEP    Person(s) Educated Patient    Methods Explanation;Demonstration;Tactile cues;Handout    Comprehension Verbal cues required;Returned demonstration;Verbalized understanding                 OT Long Term Goals - 11/14/22 0818       OT LONG TERM  GOAL #1   Title Patient to be independent in home program to initiate massage to increase flexion extension and right arm today to feed self.    Baseline No knowledge of scar management as well as elbow flat on 112 and extend to -40.  With and pain 4/10 at elbow.    Time 3    Period Weeks    Status Achieved    Target Date 10/30/22      OT LONG TERM GOAL #2   Title Right elbow flexion increased for patient to be able to wash hair and make ponytail and put earrings on without increase symptoms.    Baseline Right elbow flexion 112 degrees with some discomfort.  10th visit:  right elbow flexion to 140    Time 6    Period Weeks    Status On-going    Target Date 11/20/22      OT LONG TERM GOAL #3   Title Right elbow extension and 2-10 for patient to reach head as well as into pocket without increase symptoms.  It    Baseline Right elbow extension -40.  Did not initiate any scar management as well as home exercise program to half weeks postop.  10th visit: Right elbow extension -10 today.    Time 6    Period Weeks    Status Achieved    Target Date 11/20/22      OT LONG TERM GOAL #4   Title Strength in right elbow improved patient to be able to push and pull heavy door as well as can really more than 5 pounds without increase symptoms    Baseline Patient 2-1/2 weeks postop no strengthening only active range of motion.  Scar management and initiated pain 4/10 with active range of motion.  10th visit:  ongoing with strengthening    Time 8    Period Weeks    Status On-going    Target Date 12/04/22                   Plan - 11/20/22 1606     Clinical Impression Statement Patient present at OT evaluation with a right Transcondylar humeral distal fracture with ORIF.  With ulnar collateral ligament repair.  Surgery was done 09/20/2022. Pt is  about 8 1/2 wks s/p -  Pt demonstrates elbow flexion 140 and ext -10 to -18 coming in. REinforce with pt again not to push end range and over do  exercises especially strenghtening  HEP.  Assess elbow palpation - no pain and assess strenght in wrist and elbow in all planes - no increase pain. Upgrade to 1 lbs for elbow and shoulder HEP - keeping it pain free and not pushing end range. Upgrade to 2 lbs for wrist and forearm pain free. Tolerating well in sessions.    Patient was very active prior to fall doing yoga, hand/head stands as well as weights and walking 3-1/2 miles a day.  Discharge at this time theraband HEP - pt not doing well with them.  Pt continues to benefit from cues and reinstruction of proper technique with exercises.    Patient limited in functional use of right dominant hand and arm in ADLs and IADLs but has made significant progress in motion and use since evaluation.  Patient can benefit from skilled OT services decrease edema and scar tissue as well as pain and motion and strength to return to prior level of motion.    OT Occupational Profile and History Problem Focused Assessment - Including review of records relating to presenting problem    Occupational performance deficits (Please refer to evaluation for details): ADL's;IADL's;Play;Social Participation;Leisure    Body Structure / Function / Physical Skills ADL;Edema;Decreased knowledge of precautions;Flexibility;IADL;Pain;Strength;Scar mobility;ROM;UE functional use    Rehab Potential Good    Clinical Decision Making Limited treatment options, no task modification necessary    Comorbidities Affecting Occupational Performance: None    Modification or Assistance to Complete Evaluation  No modification of tasks or assist necessary to complete eval    OT Frequency 2x / week    OT Duration 6 weeks    OT Treatment/Interventions Self-care/ADL training;Moist Heat;Paraffin;Contrast Bath;Therapeutic exercise;Manual Therapy;Passive range of motion;Scar mobilization;Splinting;Patient/family education    Consulted and Agree with Plan of Care Patient             Patient will  benefit from skilled therapeutic intervention in order to improve the following deficits and impairments:   Body Structure / Function / Physical Skills: ADL, Edema, Decreased knowledge of precautions, Flexibility, IADL, Pain, Strength, Scar mobility, ROM, UE functional use       Visit Diagnosis: Pain in right elbow  Stiffness of right elbow, not elsewhere classified  Muscle weakness (generalized)  Scar condition and fibrosis of skin    Problem List Patient Active Problem List   Diagnosis Date Noted   CAD (coronary artery disease) 03/08/2022   Prediabetes 01/27/2021   Palpitations 01/27/2021   Left axillary pain 12/21/2019   Osteoporosis 12/21/2019   Fatigue 12/21/2019   Familial hyperlipidemia 11/10/2019   Positive ANA (antinuclear antibody) 11/10/2019   Anxiety 11/10/2019   Murmur 11/10/2019    Rosalyn Gess, OTR/L,CLT 11/20/2022, 4:10 PM  [facility] [address] [place], [address] Phone: [phone]   Fax:  [phone]  Name: Cynthia Collins MRN: [id number] Date of Birth: 02-Nov-1945

## 2022-11-21 ENCOUNTER — Ambulatory Visit (INDEPENDENT_AMBULATORY_CARE_PROVIDER_SITE_OTHER): Payer: Medicare Other | Admitting: Dermatology

## 2022-11-21 ENCOUNTER — Encounter: Payer: Self-pay | Admitting: Dermatology

## 2022-11-21 VITALS — BP 161/71 | HR 64

## 2022-11-21 DIAGNOSIS — L570 Actinic keratosis: Secondary | ICD-10-CM | POA: Diagnosis not present

## 2022-11-21 DIAGNOSIS — B078 Other viral warts: Secondary | ICD-10-CM | POA: Diagnosis not present

## 2022-11-21 DIAGNOSIS — Z1283 Encounter for screening for malignant neoplasm of skin: Secondary | ICD-10-CM | POA: Diagnosis not present

## 2022-11-21 DIAGNOSIS — I10 Essential (primary) hypertension: Secondary | ICD-10-CM

## 2022-11-21 DIAGNOSIS — D229 Melanocytic nevi, unspecified: Secondary | ICD-10-CM

## 2022-11-21 DIAGNOSIS — L821 Other seborrheic keratosis: Secondary | ICD-10-CM | POA: Diagnosis not present

## 2022-11-21 DIAGNOSIS — Z85828 Personal history of other malignant neoplasm of skin: Secondary | ICD-10-CM

## 2022-11-21 DIAGNOSIS — L578 Other skin changes due to chronic exposure to nonionizing radiation: Secondary | ICD-10-CM

## 2022-11-21 DIAGNOSIS — L814 Other melanin hyperpigmentation: Secondary | ICD-10-CM

## 2022-11-21 NOTE — Patient Instructions (Addendum)

## 2022-11-21 NOTE — Progress Notes (Signed)
Follow-Up Visit   Subjective  Cynthia Collins is a 77 y.o. female who presents for the following: Skin Cancer Screening and Full Body Skin Exam  The patient presents for Total-Body Skin Exam (TBSE) for skin cancer screening and mole check. The patient has spots, moles and lesions to be evaluated, some may be new or changing and the patient has concerns that these could be cancer.  The following portions of the chart were reviewed this encounter and updated as appropriate: medications, allergies, medical history  Review of Systems:  No other skin or systemic complaints except as noted in HPI or Assessment and Plan.  Objective  Well appearing patient in no apparent distress; mood and affect are within normal limits.  A full examination was performed including scalp, head, eyes, ears, nose, lips, neck, chest, axillae, abdomen, back, buttocks, bilateral upper extremities, bilateral lower extremities, hands, feet, fingers, toes, fingernails, and toenails. All findings within normal limits unless otherwise noted below.   Relevant physical exam findings are noted in the Assessment and Plan.   Assessment & Plan   LENTIGINES, SEBORRHEIC KERATOSES, HEMANGIOMAS - Benign normal skin lesions - Benign-appearing - Call for any changes  MELANOCYTIC NEVI - Tan-brown and/or pink-flesh-colored symmetric macules and papules - Benign appearing on exam today - Observation - Call clinic for new or changing moles - Recommend daily use of broad spectrum spf 30+ sunscreen to sun-exposed areas.   ACTINIC DAMAGE - Chronic condition, secondary to cumulative UV/sun exposure - diffuse scaly erythematous macules with underlying dyspigmentation - Recommend daily broad spectrum sunscreen SPF 30+ to sun-exposed areas, reapply every 2 hours as needed.  - Staying in the shade or wearing long sleeves, sun glasses (UVA+UVB protection) and wide brim hats (4-inch brim around the entire circumference of the hat) are  also recommended for sun protection.  - Call for new or changing lesions.  HISTORY OF SQUAMOUS CELL CARCINOMA OF THE SKIN - No evidence of recurrence today - No lymphadenopathy - Recommend regular full body skin exams - Recommend daily broad spectrum sunscreen SPF 30+ to sun-exposed areas, reapply every 2 hours as needed.  - Call if any new or changing lesions are noted between office visits  INFLAMED SEBORRHEIC KERATOSIS vs irritated skin tag - L groin  Exam: Erythematous keratotic or waxy stuck-on papule or plaque. Symptomatic, irritating, pt defers today, states lesion not as bothersome as it has been in the past.  Benign-appearing.  Call clinic for new or changing lesions.   WART  Exam: verrucous papule(s)  Discussed viral / HPV (Human Papilloma Virus) etiology and risk of spread /infectivity to other areas of body as well as to other people.  Multiple treatments and methods may be required to clear warts and it is possible treatment may not be successful.  Treatment risks include discoloration; scarring and there is still potential for wart recurrence.  Treatment Plan:  Destruction Procedure Note Destruction method: cryotherapy   Informed consent: discussed and consent obtained   Lesion destroyed using liquid nitrogen: Yes   Outcome: patient tolerated procedure well with no complications   Post-procedure details: wound care instructions given   Locations: R lat chest x 1 # of Lesions Treated: 1  Prior to procedure, discussed risks of blister formation, small wound, skin dyspigmentation, or rare scar following cryotherapy. Recommend Vaseline ointment to treated areas while healing.   POST-INFLAMMATORY HYPERPIGMENTATION (PIH) - S/P LN2, R cheek  Exam: faint hyperpigmented patch at face   This is a benign condition that comes  from having previous inflammation in the skin and will fade with time over months to sometimes years. Recommend daily sun protection including sunscreen  SPF 30+ to sun-exposed areas. - Recommend treating any itchy or red areas on the skin quickly to prevent new areas of PIH. Treating with prescription medicines such as hydroquinone may help fade dark spots faster.    Treatment Plan: No tx needed today. Patient using OTC honey.   ACTINIC KERATOSIS Exam: Erythematous thin papules/macules with gritty scale  Actinic keratoses are precancerous spots that appear secondary to cumulative UV radiation exposure/sun exposure over time. They are chronic with expected duration over 1 year. A portion of actinic keratoses will progress to squamous cell carcinoma of the skin. It is not possible to reliably predict which spots will progress to skin cancer and so treatment is recommended to prevent development of skin cancer.  Recommend daily broad spectrum sunscreen SPF 30+ to sun-exposed areas, reapply every 2 hours as needed.  Recommend staying in the shade or wearing long sleeves, sun glasses (UVA+UVB protection) and wide brim hats (4-inch brim around the entire circumference of the hat). Call for new or changing lesions.  Treatment Plan:  Prior to procedure, discussed risks of blister formation, small wound, skin dyspigmentation, or rare scar following cryotherapy. Recommend Vaseline ointment to treated areas while healing.  Destruction Procedure Note Destruction method: cryotherapy   Informed consent: discussed and consent obtained   Lesion destroyed using liquid nitrogen: Yes   Outcome: patient tolerated procedure well with no complications   Post-procedure details: wound care instructions given   Locations: R cheek x 1 # of Lesions Treated: 1   HYPERTENSION Discussed with patient to monitor and follow-up with PCP and/or cardiology.  SKIN CANCER SCREENING PERFORMED TODAY.  Return in about 6 months (around 05/23/2023) for TBSE.  Luther Redo, CMA, am acting as scribe for Forest Gleason, MD .  Documentation: I have reviewed the above  documentation for accuracy and completeness, and I agree with the above.  Forest Gleason, MD

## 2022-11-22 ENCOUNTER — Ambulatory Visit: Payer: Medicare Other | Admitting: Occupational Therapy

## 2022-11-22 DIAGNOSIS — M6281 Muscle weakness (generalized): Secondary | ICD-10-CM

## 2022-11-22 DIAGNOSIS — L905 Scar conditions and fibrosis of skin: Secondary | ICD-10-CM

## 2022-11-22 DIAGNOSIS — M25621 Stiffness of right elbow, not elsewhere classified: Secondary | ICD-10-CM

## 2022-11-22 DIAGNOSIS — M25521 Pain in right elbow: Secondary | ICD-10-CM | POA: Diagnosis not present

## 2022-11-22 NOTE — Therapy (Signed)
[facility] [address] [place], [address] Phone: [phone]   Fax:  [phone]  Occupational Therapy Treatment  Patient Details  Name: Cynthia Collins MRN: [id number] Date of Birth: 14-Feb-1946 Referring Provider (OT): DR Marcelino Scot   Encounter Date: 11/22/2022   OT End of Session - 11/22/22 1412     Visit Number 13    Number of Visits 16    Date for OT Re-Evaluation 12/04/22    OT Start Time 1316    OT Stop Time 1408    OT Time Calculation (min) 52 min    Activity Tolerance Patient tolerated treatment well    Behavior During Therapy [facility] for tasks assessed/performed             Past Medical History:  Diagnosis Date   Allergy    Anxiety    Asthma    Complication of anesthesia    slow to wake up   Heart murmur    Hyperlipidemia    Paroxysmal SVT (supraventricular tachycardia)    Pre-diabetes    Prediabetes    Squamous cell carcinoma of skin 08/03/2021   Mid chest - [facility] ED&C 09/07/21    Past Surgical History:  Procedure Laterality Date   APPENDECTOMY  08/20/1968   CESAREAN SECTION  08/20/1984   OOPHORECTOMY Right 08/20/1968   ORIF HUMERUS FRACTURE Right 09/20/2022   Procedure: OPEN REDUCTION INTERNAL FIXATION (ORIF) DISTAL HUMERUS FRACTURE;  Surgeon: Altamese [place], MD;  Location: [facility];  Service: Orthopedics;  Laterality: Right;   SKIN CANCER EXCISION      There were no vitals filed for this visit.   Subjective Assessment - 11/22/22 1411     Subjective  Done so much better with the weights than the bands - I have more control - normal soreness    Pertinent History Pt fell on 09/01/22 - seen at ED - refer to Ortho - diagnosis of RIGHT HUMERUS TRANSCONDYALR FRACTURE      POST-OPERATIVE DIAGNOSIS:    1. RIGHT HUMERUS TRANSCONDYALR FRACTURE   2. RIGHT RADIAL HEAD FRACTURE     PROCEDURE:  Procedure(s):  1. OPEN REDUCTION INTERNAL FIXATION (ORIF) DISTAL HUMERUS FRACTURE (Right)  2. OPEN TREATMENT OF  RADIAL HEAD FRACTURE  3. REPAIR OF LATERAL ULNAR COLLATERAL LIGAMENT - Surgery on 09/20/22 and refer to OT to initiate ROM    Patient Stated Goals I want my motion and strenght back so I can stand on my hands and head in yoga again, lift weigths                          OT Treatments/Exercises (OP) - 11/22/22 0001       Moist Heat Therapy   Number Minutes Moist Heat 6 Minutes    Moist Heat Location Elbow   extention prior to ther ex           Assess elbow flexion  and ext  And then strength in wrist in all planes 4+/5 - except sup/pro 4/5  Elbow strength 4-/5 for flexion, ext Remind again with pt to not to push end range with weight -do motion first  And then weight 1 lbs for shoulder ABD 12 reps And elbow flexion with over head push - 12 reps - 1 lbs  Add and tolerate well D1 and D2 patterns with 1 lbs -12 reps Ext rotation in side lying - 12 reps - 1lbs Pain free -2x day  Support  upper arm - elbow flexion ext 1lbs - palm up and thumb up 12 reps each  Painfree range  2x day  Supine elbow extention 1 lbs - block shoulder at 90 degrees flexoin  12 reps  Pain free  Pt able to do unsupported this date 2 lbs for forearm  Over armrest wrist flexion, ext Standing for RD and UD - but keep elbow stil  12 reps pain free  2 x day  UBE 6 min -         OT Education - 11/22/22 1412     Education Details progress and changes in HEP - and not overdoing HEP    Person(s) Educated Patient    Methods Explanation;Demonstration;Tactile cues;Handout    Comprehension Verbal cues required;Returned demonstration;Verbalized understanding                 OT Long Term Goals - 11/14/22 0818       OT LONG TERM GOAL #1   Title Patient to be independent in home program to initiate massage to increase flexion extension and right arm today to feed self.    Baseline No knowledge of scar management as well as elbow flat on 112 and extend to -40.  With and pain 4/10 at  elbow.    Time 3    Period Weeks    Status Achieved    Target Date 10/30/22      OT LONG TERM GOAL #2   Title Right elbow flexion increased for patient to be able to wash hair and make ponytail and put earrings on without increase symptoms.    Baseline Right elbow flexion 112 degrees with some discomfort.  10th visit:  right elbow flexion to 140    Time 6    Period Weeks    Status On-going    Target Date 11/20/22      OT LONG TERM GOAL #3   Title Right elbow extension and 2-10 for patient to reach head as well as into pocket without increase symptoms.  It    Baseline Right elbow extension -40.  Did not initiate any scar management as well as home exercise program to half weeks postop.  10th visit: Right elbow extension -10 today.    Time 6    Period Weeks    Status Achieved    Target Date 11/20/22      OT LONG TERM GOAL #4   Title Strength in right elbow improved patient to be able to push and pull heavy door as well as can really more than 5 pounds without increase symptoms    Baseline Patient 2-1/2 weeks postop no strengthening only active range of motion.  Scar management and initiated pain 4/10 with active range of motion.  10th visit:  ongoing with strengthening    Time 8    Period Weeks    Status On-going    Target Date 12/04/22                   Plan - 11/22/22 1413     Clinical Impression Statement Patient present at OT evaluation with a right Transcondylar humeral distal fracture with ORIF.  With ulnar collateral ligament repair.  Surgery was done 09/20/2022. Pt is about 9 wks s/p -  Pt demonstrates elbow flexion 140 and ext -10 to -15 coming in. REinforce with pt again not to push end range and over do exercises especially strenghtening  HEP.  Pt was upgrade to 1 lbs for elbow and  shoulder earlier this week and  2 lbs for wrist and forearm pain free. Tolerating well and now increase pain or issues. Add this date D1-D2 patterns and ext rotation - 1 lbs sidelying -  tolerating well -pt can increase if no issues over the weekend to 2nd set.     Patient was very active prior to fall doing yoga, hand/head stands as well as weights and walking 3-1/2 miles a day.  Pt continues to benefit from cues and reinstruction of proper technique with exercises.    Patient limited in functional use of right dominant hand and arm in ADLs and IADLs but has made significant progress in motion and use since evaluation.  Patient can benefit from skilled OT services decrease edema and scar tissue as well as pain and motion and strength to return to prior level of motion.    OT Occupational Profile and History Problem Focused Assessment - Including review of records relating to presenting problem    Occupational performance deficits (Please refer to evaluation for details): ADL's;IADL's;Play;Social Participation;Leisure    Body Structure / Function / Physical Skills ADL;Edema;Decreased knowledge of precautions;Flexibility;IADL;Pain;Strength;Scar mobility;ROM;UE functional use    Rehab Potential Good    Clinical Decision Making Limited treatment options, no task modification necessary    Comorbidities Affecting Occupational Performance: None    Modification or Assistance to Complete Evaluation  No modification of tasks or assist necessary to complete eval    OT Frequency 2x / week    OT Duration 6 weeks    OT Treatment/Interventions Self-care/ADL training;Moist Heat;Paraffin;Contrast Bath;Therapeutic exercise;Manual Therapy;Passive range of motion;Scar mobilization;Splinting;Patient/family education    Consulted and Agree with Plan of Care Patient             Patient will benefit from skilled therapeutic intervention in order to improve the following deficits and impairments:   Body Structure / Function / Physical Skills: ADL, Edema, Decreased knowledge of precautions, Flexibility, IADL, Pain, Strength, Scar mobility, ROM, UE functional use       Visit Diagnosis: Pain in right  elbow  Stiffness of right elbow, not elsewhere classified  Muscle weakness (generalized)  Scar condition and fibrosis of skin    Problem List Patient Active Problem List   Diagnosis Date Noted   CAD (coronary artery disease) 03/08/2022   Prediabetes 01/27/2021   Palpitations 01/27/2021   Left axillary pain 12/21/2019   Osteoporosis 12/21/2019   Fatigue 12/21/2019   Familial hyperlipidemia 11/10/2019   Positive ANA (antinuclear antibody) 11/10/2019   Anxiety 11/10/2019   Murmur 11/10/2019    Rosalyn Gess, OTR/L,CLT 11/22/2022, 2:16 PM  [facility] [address] [place], [address] Phone: [phone]   Fax:  [phone]  Name: Cynthia Collins MRN: [id number] Date of Birth: 1945-09-25

## 2022-11-27 ENCOUNTER — Ambulatory Visit: Payer: Medicare Other | Admitting: Occupational Therapy

## 2022-11-27 DIAGNOSIS — M25521 Pain in right elbow: Secondary | ICD-10-CM

## 2022-11-27 DIAGNOSIS — M25621 Stiffness of right elbow, not elsewhere classified: Secondary | ICD-10-CM

## 2022-11-27 DIAGNOSIS — M6281 Muscle weakness (generalized): Secondary | ICD-10-CM

## 2022-11-27 NOTE — Therapy (Signed)
[facility] [address] [place], [address] Phone: [phone]   Fax:  [phone]  Occupational Therapy Treatment  Patient Details  Name: Cynthia Collins MRN: [id number] Date of Birth: Oct 17, 1945 Referring Provider (OT): DR Carola Frost   Encounter Date: 11/27/2022   OT End of Session - 11/27/22 1402     Visit Number 14    Number of Visits 16    Date for OT Re-Evaluation 12/04/22    OT Start Time 1315    OT Stop Time 1358    OT Time Calculation (min) 43 min    Activity Tolerance Patient tolerated treatment well    Behavior During Therapy [facility] for tasks assessed/performed             Past Medical History:  Diagnosis Date   Allergy    Anxiety    Asthma    Complication of anesthesia    slow to wake up   Heart murmur    Hyperlipidemia    Paroxysmal SVT (supraventricular tachycardia)    Pre-diabetes    Prediabetes    Squamous cell carcinoma of skin 08/03/2021   Mid chest - [facility] ED&C 09/07/21    Past Surgical History:  Procedure Laterality Date   APPENDECTOMY  08/20/1968   CESAREAN SECTION  08/20/1984   OOPHORECTOMY Right 08/20/1968   ORIF HUMERUS FRACTURE Right 09/20/2022   Procedure: OPEN REDUCTION INTERNAL FIXATION (ORIF) DISTAL HUMERUS FRACTURE;  Surgeon: Myrene Galas, MD;  Location: [facility] OR;  Service: Orthopedics;  Laterality: Right;   SKIN CANCER EXCISION      There were no vitals filed for this visit.   Subjective Assessment - 11/27/22 1401     Subjective  I am doing okay with the 2 lbs for forearm and wrist - and 1 lbs for elbow and shoulder -soreness but no pain really. Was thinking wants to go up to 1 1/2 and not 2 for elbow and shoulder    Pertinent History Pt fell on 09/01/22 - seen at ED - refer to Ortho - diagnosis of RIGHT HUMERUS TRANSCONDYALR FRACTURE      POST-OPERATIVE DIAGNOSIS:    1. RIGHT HUMERUS TRANSCONDYALR FRACTURE   2. RIGHT RADIAL HEAD FRACTURE     PROCEDURE:  Procedure(s):  1.  OPEN REDUCTION INTERNAL FIXATION (ORIF) DISTAL HUMERUS FRACTURE (Right)  2. OPEN TREATMENT OF RADIAL HEAD FRACTURE  3. REPAIR OF LATERAL ULNAR COLLATERAL LIGAMENT - Surgery on 09/20/22 and refer to OT to initiate ROM    Patient Stated Goals I want my motion and strenght back so I can stand on my hands and head in yoga again, lift weigths    Currently in Pain? No/denies                [facility] OT Assessment - 11/27/22 0001       AROM   Right Elbow Flexion 142    Right Elbow Extension -8      Strength   Right Hand Grip (lbs) 25    Right Hand Lateral Pinch 9 lbs    Right Hand 3 Point Pinch 8 lbs    Left Hand Grip (lbs) 39    Left Hand Lateral Pinch 9 lbs    Left Hand 3 Point Pinch 10 lbs              able to carry 2 lbs but pull at elbow with 3 lbs       Assess elbow flexion  and ext  And then strength in wrist in all planes 4+/5 - except sup/pro 4/5   Elbow strength 4-/5 for flexion, ext Remind again with pt to not to push end range with weight -do motion first  And then weight 1 1/2 lbs for shoulder ABD 12 reps And elbow flexion with over head push - 12 reps - 1 1/2 lbs  D1 and D2 patterns with 1 1/2 lbs -12 reps Ext rotation in side lying - 12 reps - 1 1/2lbs Pain free -2x day  Support upper arm - elbow flexion ext 1 1/2 lbs - palm up and thumb up 12 reps each  Painfree range  2x day  Supine elbow extention 1 1/2 lbs - block shoulder at 90 degrees flexoin  12 reps  Pain free  Pt able to do unsupported this date 2 lbs for forearm  Over armrest wrist flexion, ext Standing for RD and UD - but keep elbow stil  2 x 12 reps pain free  2 x day  Simulate UBE FW and BW with 1 1/2 lbs - 1 min each way  Appt with surgeon tomorrow                 OT Education - 11/27/22 1402     Education Details progress and changes in HEP - and not overdoing HEP    Person(s) Educated Patient    Methods Explanation;Demonstration;Tactile cues;Handout    Comprehension  Verbal cues required;Returned demonstration;Verbalized understanding                 OT Long Term Goals - 11/14/22 0818       OT LONG TERM GOAL #1   Title Patient to be independent in home program to initiate massage to increase flexion extension and right arm today to feed self.    Baseline No knowledge of scar management as well as elbow flat on 112 and extend to -40.  With and pain 4/10 at elbow.    Time 3    Period Weeks    Status Achieved    Target Date 10/30/22      OT LONG TERM GOAL #2   Title Right elbow flexion increased for patient to be able to wash hair and make ponytail and put earrings on without increase symptoms.    Baseline Right elbow flexion 112 degrees with some discomfort.  10th visit:  right elbow flexion to 140    Time 6    Period Weeks    Status On-going    Target Date 11/20/22      OT LONG TERM GOAL #3   Title Right elbow extension and 2-10 for patient to reach head as well as into pocket without increase symptoms.  It    Baseline Right elbow extension -40.  Did not initiate any scar management as well as home exercise program to half weeks postop.  10th visit: Right elbow extension -10 today.    Time 6    Period Weeks    Status Achieved    Target Date 11/20/22      OT LONG TERM GOAL #4   Title Strength in right elbow improved patient to be able to push and pull heavy door as well as can really more than 5 pounds without increase symptoms    Baseline Patient 2-1/2 weeks postop no strengthening only active range of motion.  Scar management and initiated pain 4/10 with active range of motion.  10th visit:  ongoing with strengthening    Time  8    Period Weeks    Status On-going    Target Date 12/04/22                   Plan - 11/27/22 1403     Clinical Impression Statement Patient present at OT evaluation with a right Transcondylar humeral distal fracture with ORIF.  With ulnar collateral ligament repair.  Surgery was done 09/20/2022. Pt  is about 10 wks s/p -  Pt demonstrates elbow flexion 140 and ext  -8 coming in. REinforce with pt again not to push end range and over do exercises especially strenghtening  HEP.  Pt was upgrade to 1 1/2 lbs for elbow and shoulder today and  2 lbs for wrist and forearm pain free. Tolerating well and no increase pain or issues. Cont  D1-D2 patterns and ext rotation - 1 1/2 lbs sidelying - tolerating well.     Patient was very active prior to fall doing yoga, hand/head stands as well as weights and walking 3-1/2 miles a day.  Pt continues to benefit from cues and reinstruction of proper technique with exercises.    Patient limited in functional use of right dominant hand and arm in ADLs and IADLs but has made significant progress in motion and use since evaluation.  Patient can benefit from skilled OT services decrease edema and scar tissue as well as pain and motion and strength to return to prior level of motion.    OT Occupational Profile and History Problem Focused Assessment - Including review of records relating to presenting problem    Occupational performance deficits (Please refer to evaluation for details): ADL's;IADL's;Play;Social Participation;Leisure    Body Structure / Function / Physical Skills ADL;Edema;Decreased knowledge of precautions;Flexibility;IADL;Pain;Strength;Scar mobility;ROM;UE functional use    Rehab Potential Good    Clinical Decision Making Limited treatment options, no task modification necessary    Comorbidities Affecting Occupational Performance: None    Modification or Assistance to Complete Evaluation  No modification of tasks or assist necessary to complete eval    OT Frequency 2x / week    OT Duration 6 weeks    OT Treatment/Interventions Self-care/ADL training;Moist Heat;Paraffin;Contrast Bath;Therapeutic exercise;Manual Therapy;Passive range of motion;Scar mobilization;Splinting;Patient/family education    Consulted and Agree with Plan of Care Patient              Patient will benefit from skilled therapeutic intervention in order to improve the following deficits and impairments:   Body Structure / Function / Physical Skills: ADL, Edema, Decreased knowledge of precautions, Flexibility, IADL, Pain, Strength, Scar mobility, ROM, UE functional use       Visit Diagnosis: Pain in right elbow  Stiffness of right elbow, not elsewhere classified  Muscle weakness (generalized)    Problem List Patient Active Problem List   Diagnosis Date Noted   CAD (coronary artery disease) 03/08/2022   Prediabetes 01/27/2021   Palpitations 01/27/2021   Left axillary pain 12/21/2019   Osteoporosis 12/21/2019   Fatigue 12/21/2019   Familial hyperlipidemia 11/10/2019   Positive ANA (antinuclear antibody) 11/10/2019   Anxiety 11/10/2019   Murmur 11/10/2019    Oletta Cohn, OTR/L,CLT 11/27/2022, 2:05 PM  [facility] [address], [place], [address] Phone: [phone]   Fax:  [phone]  Name: Cynthia Collins MRN: [id number] Date of Birth: 1946-04-21

## 2022-11-29 ENCOUNTER — Ambulatory Visit: Payer: Medicare Other | Admitting: Occupational Therapy

## 2022-11-29 ENCOUNTER — Encounter: Payer: Self-pay | Admitting: Occupational Therapy

## 2022-11-29 DIAGNOSIS — M25521 Pain in right elbow: Secondary | ICD-10-CM | POA: Diagnosis not present

## 2022-11-29 DIAGNOSIS — L905 Scar conditions and fibrosis of skin: Secondary | ICD-10-CM

## 2022-11-29 DIAGNOSIS — M6281 Muscle weakness (generalized): Secondary | ICD-10-CM

## 2022-11-29 DIAGNOSIS — M25621 Stiffness of right elbow, not elsewhere classified: Secondary | ICD-10-CM

## 2022-11-29 NOTE — Therapy (Signed)
[facility] [address], [place], [address] Phone: [phone]   Fax:  [phone]  Occupational Therapy Treatment  Patient Details  Name: Cynthia Collins MRN: [id number] Date of Birth: 07/26/1946 Referring Provider (OT): DR Carola Frost   Encounter Date: 11/29/2022   OT End of Session - 11/29/22 1318     Visit Number 15    Number of Visits 16    Date for OT Re-Evaluation 12/04/22    OT Start Time 1318    OT Stop Time 1400    OT Time Calculation (min) 42 min    Activity Tolerance Patient tolerated treatment well    Behavior During Therapy [facility] for tasks assessed/performed             Past Medical History:  Diagnosis Date   Allergy    Anxiety    Asthma    Complication of anesthesia    slow to wake up   Heart murmur    Hyperlipidemia    Paroxysmal SVT (supraventricular tachycardia)    Pre-diabetes    Prediabetes    Squamous cell carcinoma of skin 08/03/2021   Mid chest - [facility] ED&C 09/07/21    Past Surgical History:  Procedure Laterality Date   APPENDECTOMY  08/20/1968   CESAREAN SECTION  08/20/1984   OOPHORECTOMY Right 08/20/1968   ORIF HUMERUS FRACTURE Right 09/20/2022   Procedure: OPEN REDUCTION INTERNAL FIXATION (ORIF) DISTAL HUMERUS FRACTURE;  Surgeon: Myrene Galas, MD;  Location: [facility] OR;  Service: Orthopedics;  Laterality: Right;   SKIN CANCER EXCISION      There were no vitals filed for this visit.   Subjective Assessment - 11/29/22 1317     Subjective  Everything went well at Dr office -seen xray and he told me to came back in about 2 months- told me the stiffness can still take while - like when we work it and it tightens up    Pertinent History Pt fell on 09/01/22 - seen at ED - refer to Ortho - diagnosis of RIGHT HUMERUS TRANSCONDYALR FRACTURE      POST-OPERATIVE DIAGNOSIS:    1. RIGHT HUMERUS TRANSCONDYALR FRACTURE   2. RIGHT RADIAL HEAD FRACTURE     PROCEDURE:  Procedure(s):  1. OPEN  REDUCTION INTERNAL FIXATION (ORIF) DISTAL HUMERUS FRACTURE (Right)  2. OPEN TREATMENT OF RADIAL HEAD FRACTURE  3. REPAIR OF LATERAL ULNAR COLLATERAL LIGAMENT - Surgery on 09/20/22 and refer to OT to initiate ROM    Patient Stated Goals I want my motion and strenght back so I can stand on my hands and head in yoga again, lift weigths                          OT Treatments/Exercises (OP) - 11/29/22 0001       Moist Heat Therapy   Number Minutes Moist Heat 6 Minutes    Moist Heat Location Elbow   ext stretch            Assess elbow flexion  and ext  And then strength in wrist in all planes 4+/5 - except sup/pro 4/5   Elbow strength 4/5 for flexion, ext in range Remind again with pt to not to push end range with weight -do motion first  And then weight 1 1/2 lbs for shoulder ABD 12 reps And elbow flexion with over head push - 12 reps - 1 1/2 lbs  D1 and D2 patterns with 1 1/2  lbs -12 reps Patient if she started a new exercise or new weight to drop her reps and sets. Patient doing some of her own exercises 2.  Ext rotation in side lying - 12 reps - 1 1/2lbs Pain free -2x day  Remind patient to do do 1 set with 1 pound and the other set with a 1-1/2 if discomfort with external rotation and D1-D2 patterns.  Support upper arm - elbow flexion ext 1 1/2 lbs - palm up and thumb up 12 reps each  Painfree range  2x day  Done in clinic Biodex 5 pounds bilaterally using left hand more than right for 5 pounds 10 reps as well as scapular retraction 5 pounds bilaterally 10 reps pain-free On the wall with extended arm shoulder flexion 90 on ball 4 x 20 seconds for elbow extension and stabilization of the scapula-pain-free Wall walks through palm up and down 3 positions on the wall for elbow extension and scapular stabilization pain-free 10 reps    Pt able to do unsupported  2 lbs for forearm  Over armrest wrist flexion, ext Standing for RD and UD - but keep elbow stil  2 x 12  reps pain free  2 x day  UBE 4 minutes forward and back 2 minutes each pain-free         OT Education - 11/29/22 1317     Education Details progress and changes in HEP - and not overdoing HEP    Person(s) Educated Patient    Methods Explanation;Demonstration;Tactile cues;Handout    Comprehension Verbal cues required;Returned demonstration;Verbalized understanding                 OT Long Term Goals - 11/14/22 0818       OT LONG TERM GOAL #1   Title Patient to be independent in home program to initiate massage to increase flexion extension and right arm today to feed self.    Baseline No knowledge of scar management as well as elbow flat on 112 and extend to -40.  With and pain 4/10 at elbow.    Time 3    Period Weeks    Status Achieved    Target Date 10/30/22      OT LONG TERM GOAL #2   Title Right elbow flexion increased for patient to be able to wash hair and make ponytail and put earrings on without increase symptoms.    Baseline Right elbow flexion 112 degrees with some discomfort.  10th visit:  right elbow flexion to 140    Time 6    Period Weeks    Status On-going    Target Date 11/20/22      OT LONG TERM GOAL #3   Title Right elbow extension and 2-10 for patient to reach head as well as into pocket without increase symptoms.  It    Baseline Right elbow extension -40.  Did not initiate any scar management as well as home exercise program to half weeks postop.  10th visit: Right elbow extension -10 today.    Time 6    Period Weeks    Status Achieved    Target Date 11/20/22      OT LONG TERM GOAL #4   Title Strength in right elbow improved patient to be able to push and pull heavy door as well as can really more than 5 pounds without increase symptoms    Baseline Patient 2-1/2 weeks postop no strengthening only active range of motion.  Scar management and  initiated pain 4/10 with active range of motion.  10th visit:  ongoing with strengthening    Time 8     Period Weeks    Status On-going    Target Date 12/04/22                   Plan - 11/29/22 1318     Clinical Impression Statement Patient present at OT evaluation with a right Transcondylar humeral distal fracture with ORIF.  With ulnar collateral ligament repair.  Surgery was done 09/20/2022. Pt is about 10 wks s/p -  Pt demonstrates elbow flexion 140 and ext  -8 . Pt seen Dr Carola Frost yesterday and doing well - per Dr Carola Frost mild HO -and progessing well to cont motion and cont with increasing resistance slowly.  REinforce with pt again not to push end range and over do exercises especially strenghtening  HEP.  Pt was upgrade to 1 1/2 lbs for elbow and shoulder this week  and  2 lbs for wrist and forearm pain free. Tolerating well and no increase pain or issues. Cont  D1-D2 patterns and ext rotation. Reinforce with pt again if increase resistance for motion she has to decrease reps and sets - pt is doing her own exercises too.      Patient was very active prior to fall doing yoga, hand/head stands as well as weights and walking 3-1/2 miles a day.  Pt continues to benefit from cues and reinstruction of proper technique with exercises.    Patient limited in functional use of right dominant hand and arm in ADLs and IADLs but has made significant progress in motion and use since evaluation.  Patient can benefit from skilled OT services decrease edema and scar tissue as well as pain and motion and strength to return to prior level of motion.    OT Occupational Profile and History Problem Focused Assessment - Including review of records relating to presenting problem    Occupational performance deficits (Please refer to evaluation for details): ADL's;IADL's;Play;Social Participation;Leisure    Body Structure / Function / Physical Skills ADL;Edema;Decreased knowledge of precautions;Flexibility;IADL;Pain;Strength;Scar mobility;ROM;UE functional use    Rehab Potential Good    Clinical Decision Making Limited  treatment options, no task modification necessary    Comorbidities Affecting Occupational Performance: None    Modification or Assistance to Complete Evaluation  No modification of tasks or assist necessary to complete eval    OT Frequency 2x / week    OT Duration 6 weeks    OT Treatment/Interventions Self-care/ADL training;Moist Heat;Paraffin;Contrast Bath;Therapeutic exercise;Manual Therapy;Passive range of motion;Scar mobilization;Splinting;Patient/family education    Consulted and Agree with Plan of Care Patient             Patient will benefit from skilled therapeutic intervention in order to improve the following deficits and impairments:   Body Structure / Function / Physical Skills: ADL, Edema, Decreased knowledge of precautions, Flexibility, IADL, Pain, Strength, Scar mobility, ROM, UE functional use       Visit Diagnosis: Pain in right elbow  Stiffness of right elbow, not elsewhere classified  Muscle weakness (generalized)  Scar condition and fibrosis of skin    Problem List Patient Active Problem List   Diagnosis Date Noted   CAD (coronary artery disease) 03/08/2022   Prediabetes 01/27/2021   Palpitations 01/27/2021   Left axillary pain 12/21/2019   Osteoporosis 12/21/2019   Fatigue 12/21/2019   Familial hyperlipidemia 11/10/2019   Positive ANA (antinuclear antibody) 11/10/2019   Anxiety 11/10/2019  Murmur 11/10/2019    Oletta CohnuPreez, Keelin Neville, OTR/L,CLT 11/29/2022, 4:05 PM  [facility] [address] [place], [place], [address] Phone: [phone]   Fax:  [phone]  Name: Rivka SaferMarian Cue MRN: [id number] Date of Birth: 07/04/1946

## 2022-12-04 ENCOUNTER — Ambulatory Visit: Payer: Medicare Other | Admitting: Occupational Therapy

## 2022-12-04 DIAGNOSIS — M25521 Pain in right elbow: Secondary | ICD-10-CM

## 2022-12-04 DIAGNOSIS — M6281 Muscle weakness (generalized): Secondary | ICD-10-CM

## 2022-12-04 DIAGNOSIS — L905 Scar conditions and fibrosis of skin: Secondary | ICD-10-CM

## 2022-12-04 DIAGNOSIS — M25621 Stiffness of right elbow, not elsewhere classified: Secondary | ICD-10-CM

## 2022-12-04 NOTE — Therapy (Signed)
[facility] [address] [place], [address] Phone: [phone]   Fax:  [phone]  Occupational Therapy Treatment  Patient Details  Name: Cynthia Collins MRN: [id number] Date of Birth: [birth year] Referring Provider (OT): DR Carola Frost   Encounter Date: 12/04/2022   OT End of Session - 12/04/22 1326     Visit Number 16    Number of Visits 26    Date for OT Re-Evaluation 01/15/23    OT Start Time 1320    OT Stop Time 1415    OT Time Calculation (min) 55 min    Activity Tolerance Patient tolerated treatment well    Behavior During Therapy [facility] for tasks assessed/performed             Past Medical History:  Diagnosis Date   Allergy    Anxiety    Asthma    Complication of anesthesia    slow to wake up   Heart murmur    Hyperlipidemia    Paroxysmal SVT (supraventricular tachycardia)    Pre-diabetes    Prediabetes    Squamous cell carcinoma of skin 08/03/2021   Mid chest - [facility] ED&C 09/07/21    Past Surgical History:  Procedure Laterality Date   APPENDECTOMY  08/20/1968   CESAREAN SECTION  08/20/1984   OOPHORECTOMY Right 08/20/1968   ORIF HUMERUS FRACTURE Right 09/20/2022   Procedure: OPEN REDUCTION INTERNAL FIXATION (ORIF) DISTAL HUMERUS FRACTURE;  Surgeon: Myrene Galas, MD;  Location: [facility] OR;  Service: Orthopedics;  Laterality: Right;   SKIN CANCER EXCISION      There were no vitals filed for this visit.   Subjective Assessment - 12/04/22 1326     Subjective  I feel stronger - can tell difference - I am doing your exercises but then I am doing my own too with the 1 1/2 lbs weight    Pertinent History Pt fell on 09/01/22 - seen at ED - refer to Ortho - diagnosis of RIGHT HUMERUS TRANSCONDYALR FRACTURE      POST-OPERATIVE DIAGNOSIS:    1. RIGHT HUMERUS TRANSCONDYALR FRACTURE   2. RIGHT RADIAL HEAD FRACTURE     PROCEDURE:  Procedure(s):  1. OPEN REDUCTION INTERNAL FIXATION (ORIF) DISTAL HUMERUS FRACTURE  (Right)  2. OPEN TREATMENT OF RADIAL HEAD FRACTURE  3. REPAIR OF LATERAL ULNAR COLLATERAL LIGAMENT - Surgery on 09/20/22 and refer to OT to initiate ROM    Patient Stated Goals I want my motion and strenght back so I can stand on my hands and head in yoga again, lift weigths    Currently in Pain? No/denies                [facility] OT Assessment - 12/04/22 0001       AROM   Right Elbow Flexion 142    Right Elbow Extension -8      Strength   Right Hand Grip (lbs) 25    Right Hand Lateral Pinch 9 lbs    Right Hand 3 Point Pinch 8 lbs    Left Hand Grip (lbs) 39    Left Hand Lateral Pinch 9 lbs    Left Hand 3 Point Pinch 10 lbs                      OT Treatments/Exercises (OP) - 12/04/22 0001       Moist Heat Therapy   Number Minutes Moist Heat 5 Minutes    Moist Heat Location Elbow  extention 5 min prior to ther ex           Assess elbow flexion  and ext  And then strength in wrist in all planes 4+/5 - except sup/pro 4/5   Elbow strength 4/5 for flexion, ext in range Remind again with pt to not to push end range with weight -do motion first  And then weight 1 1/2 lbs for shoulder ABD 12 reps And elbow flexion with over head push - 12 reps - 1 1/2 lbs  D1 and D2 patterns with 1 1/2 lbs -12 reps Patient if she started a new exercise or new weight to drop her reps and sets. Patient doing her own exercises to and with 1 1/2 lbs weight   Ext rotation in side lying - 12 reps - 1 1/2lbs Pain free -2x day    Support upper arm - elbow flexion ext 1 1/2 lbs - palm up and thumb up 12 reps each  Painfree range  2x day  Done in clinic Biodex 5 pounds bilaterally using left hand more than right for 5 pounds  2 x 10 reps as well as scapular retraction 5 pounds bilaterally  2 x 10 reps pain-free On the wall  1 kg ball roll up the wall palm into elbow extention over head 1 min  Rowing 2 x 10 reps 5 lbs on biodex     Pt able to do unsupported  2 lbs for forearm  Over  armrest wrist flexion, ext Standing for RD and UD - but keep elbow stil  2 x 12 reps pain free  2 x day  UBE 4 minutes forward and back 2 minutes each pain-free                OT Education - 12/04/22 1326     Education Details progress and changes in HEP - and not overdoing HEP    Person(s) Educated Patient    Methods Explanation;Demonstration;Tactile cues;Handout    Comprehension Verbal cues required;Returned demonstration;Verbalized understanding                 OT Long Term Goals - 12/04/22 1441       OT LONG TERM GOAL #1   Title Patient to be independent in home program to initiate massage to increase flexion extension and right arm today to feed self.    Status Achieved      OT LONG TERM GOAL #2   Title Right elbow flexion increased for patient to be able to wash hair and make ponytail and put earrings on without increase symptoms.    Status Achieved      OT LONG TERM GOAL #3   Title Right elbow extension and 2-10 for patient to reach head as well as into pocket without increase symptoms.  It    Status Achieved      OT LONG TERM GOAL #4   Title Strength in right elbow improved patient to be able to push and pull heavy door as well as can really more than 5 pounds without increase symptoms    Baseline Strenghthening pt doing 1 1/2 lbs - has increase pull with 3 lbs at elbow - no weightbearing yet    Time 6    Period Weeks    Status On-going    Target Date 01/15/23      OT LONG TERM GOAL #5   Title R grip strength increase for pt to carry more than 5 lbs for groceries and  playing with grand daughter without symptoms    Baseline Grip strength 25 R , L 38 - discomfort and pull with carry 3 lbs -doing 2 lbs forearm and wrist -and 1 /2 for elbow /shoulder    Time 6    Period Weeks    Status New    Target Date 01/15/23                   Plan - 12/04/22 1412     Clinical Impression Statement Patient present at OT evaluation with a right  Transcondylar humeral distal fracture with ORIF.  With ulnar collateral ligament repair.  Surgery was done 09/20/2022. Pt is about 11 wks s/p -  Pt demonstrates elbow flexion 142 and ext  -8 . Pt seen Dr Carola Frost last week  and doing well - per Dr Carola Frost mild HO -and progessing well to cont motion and cont with increasing resistance slowly.  REinforce with pt again not to push end range and over do exercises especially strenghtening  HEP.  Pt doing 1 1/2 lbs for elbow and shoulder   and  2 lbs for wrist and forearm pain free. Tolerating well and no increase pain or issues. Reinforce with pt again if increase resistance for motion she has to decrease reps and sets - pt is doing her own exercises too using 1 1/2 lbs.      Patient was very active prior to fall doing yoga, hand/head stands as well as weights and walking 3-1/2 miles a day.  Pt continues to benefit from cues and reinstruction of proper technique with exercises.    Patient limited in functional use of right dominant hand and arm in ADLs and IADLs but has made significant progress in motion/ strength and use since evaluation.  Patient can benefit from skilled OT services decrease edema and scar tissue as well as pain and motion and strength to return to prior level of motion.    OT Occupational Profile and History Problem Focused Assessment - Including review of records relating to presenting problem    Occupational performance deficits (Please refer to evaluation for details): ADL's;IADL's;Play;Social Participation;Leisure    Body Structure / Function / Physical Skills ADL;Edema;Decreased knowledge of precautions;Flexibility;IADL;Pain;Strength;Scar mobility;ROM;UE functional use    Rehab Potential Good    Clinical Decision Making Limited treatment options, no task modification necessary    Comorbidities Affecting Occupational Performance: None    Modification or Assistance to Complete Evaluation  No modification of tasks or assist necessary to complete  eval    OT Frequency 2x / week    OT Duration 6 weeks    OT Treatment/Interventions Self-care/ADL training;Moist Heat;Paraffin;Contrast Bath;Therapeutic exercise;Manual Therapy;Passive range of motion;Scar mobilization;Splinting;Patient/family education    Consulted and Agree with Plan of Care Patient             Patient will benefit from skilled therapeutic intervention in order to improve the following deficits and impairments:   Body Structure / Function / Physical Skills: ADL, Edema, Decreased knowledge of precautions, Flexibility, IADL, Pain, Strength, Scar mobility, ROM, UE functional use       Visit Diagnosis: Pain in right elbow  Stiffness of right elbow, not elsewhere classified  Muscle weakness (generalized)  Scar condition and fibrosis of skin    Problem List Patient Active Problem List   Diagnosis Date Noted   CAD (coronary artery disease) 03/08/2022   Prediabetes 01/27/2021   Palpitations 01/27/2021   Left axillary pain 12/21/2019   Osteoporosis 12/21/2019   Fatigue  12/21/2019   Familial hyperlipidemia 11/10/2019   Positive ANA (antinuclear antibody) 11/10/2019   Anxiety 11/10/2019   Murmur 11/10/2019    Oletta Cohn, OTR/L,CLT 12/04/2022, 2:47 PM  [facility] [address], [place], [address] Phone: [phone]   Fax:  [phone]  Name: Xariah Silvernail MRN: [id number] Date of Birth: 01-12-1946

## 2022-12-06 ENCOUNTER — Ambulatory Visit: Payer: Medicare Other | Admitting: Occupational Therapy

## 2022-12-06 DIAGNOSIS — M6281 Muscle weakness (generalized): Secondary | ICD-10-CM

## 2022-12-06 DIAGNOSIS — M25521 Pain in right elbow: Secondary | ICD-10-CM

## 2022-12-06 DIAGNOSIS — L905 Scar conditions and fibrosis of skin: Secondary | ICD-10-CM

## 2022-12-06 DIAGNOSIS — M25621 Stiffness of right elbow, not elsewhere classified: Secondary | ICD-10-CM

## 2022-12-06 NOTE — Therapy (Signed)
[facility] [address], [place], [address] Phone: [phone]   Fax:  [phone]  Occupational Therapy Treatment  Patient Details  Name: Cynthia Collins MRN: [id number] Date of Birth: 01-Sep-1945 Referring Provider (OT): DR Carola Frost   Encounter Date: 12/06/2022   OT End of Session - 12/06/22 1406     Visit Number 17    Number of Visits 26    Date for OT Re-Evaluation 01/15/23    OT Start Time 1401    OT Stop Time 1446    OT Time Calculation (min) 45 min    Activity Tolerance Patient tolerated treatment well    Behavior During Therapy [facility] for tasks assessed/performed             Past Medical History:  Diagnosis Date   Allergy    Anxiety    Asthma    Complication of anesthesia    slow to wake up   Heart murmur    Hyperlipidemia    Paroxysmal SVT (supraventricular tachycardia)    Pre-diabetes    Prediabetes    Squamous cell carcinoma of skin 08/03/2021   Mid chest - [facility] ED&C 09/07/21    Past Surgical History:  Procedure Laterality Date   APPENDECTOMY  08/20/1968   CESAREAN SECTION  08/20/1984   OOPHORECTOMY Right 08/20/1968   ORIF HUMERUS FRACTURE Right 09/20/2022   Procedure: OPEN REDUCTION INTERNAL FIXATION (ORIF) DISTAL HUMERUS FRACTURE;  Surgeon: Myrene Galas, MD;  Location: [facility] OR;  Service: Orthopedics;  Laterality: Right;   SKIN CANCER EXCISION      There were no vitals filed for this visit.   Subjective Assessment - 12/06/22 1405     Subjective  Doing better- little tight but not sore- doing my own exercises with 1 1/2 lbs and yours- are we  going up to 2 lbs- still favoring some my arm but using it more    Pertinent History Pt fell on 09/01/22 - seen at ED - refer to Ortho - diagnosis of RIGHT HUMERUS TRANSCONDYALR FRACTURE      POST-OPERATIVE DIAGNOSIS:    1. RIGHT HUMERUS TRANSCONDYALR FRACTURE   2. RIGHT RADIAL HEAD FRACTURE     PROCEDURE:  Procedure(s):  1. OPEN REDUCTION INTERNAL  FIXATION (ORIF) DISTAL HUMERUS FRACTURE (Right)  2. OPEN TREATMENT OF RADIAL HEAD FRACTURE  3. REPAIR OF LATERAL ULNAR COLLATERAL LIGAMENT - Surgery on 09/20/22 and refer to OT to initiate ROM    Patient Stated Goals I want my motion and strenght back so I can stand on my hands and head in yoga again, lift weigths                Reviewed with patient functional use of right upper extremity. Patient able to make bed with more ease except for fitting sheet.  Patient able to do laundry but careful with picking up and carrying basket. Cooking with more ease but lifting pots and pans. Reviewed and assess patient's ability to open her car door and putting seatbelt on.  Was able to do. Pushing and pulling metal door of clinic able to do with ease and no pain. Turning doorknob no issues. Patient was able to carry 3 pounds with more ease.          OT Treatments/Exercises (OP) - 12/06/22 0001       Moist Heat Therapy   Number Minutes Moist Heat 5 Minutes    Moist Heat Location Elbow  Support upper arm - elbow flexion ext 1 1/2 lbs - palm up and thumb up 12 reps each  Painfree range  2x day  Biodex 5 pounds bilaterally using left hand more than right for 5 pounds  2 x 10 reps - full range and from 90 to extention   scapular retraction 5 pounds bilaterally  2 x 10 reps pain-free Pulling with L and assist with R - chest press 5 lbs - controlling core 12 reps -each side      Pt able to do unsupported  2 lbs for forearm  Over armrest wrist flexion, ext Standing for RD and UD - but keep elbow stil  2 x 12 reps pain free  2 x day             OT Education - 12/06/22 1405     Education Details progress and changes in HEP - and not overdoing HEP    Person(s) Educated Patient    Methods Explanation;Demonstration;Tactile cues;Handout    Comprehension Verbal cues required;Returned demonstration;Verbalized understanding                 OT Long Term Goals  - 12/04/22 1441       OT LONG TERM GOAL #1   Title Patient to be independent in home program to initiate massage to increase flexion extension and right arm today to feed self.    Status Achieved      OT LONG TERM GOAL #2   Title Right elbow flexion increased for patient to be able to wash hair and make ponytail and put earrings on without increase symptoms.    Status Achieved      OT LONG TERM GOAL #3   Title Right elbow extension and 2-10 for patient to reach head as well as into pocket without increase symptoms.  It    Status Achieved      OT LONG TERM GOAL #4   Title Strength in right elbow improved patient to be able to push and pull heavy door as well as can really more than 5 pounds without increase symptoms    Baseline Strenghthening pt doing 1 1/2 lbs - has increase pull with 3 lbs at elbow - no weightbearing yet    Time 6    Period Weeks    Status On-going    Target Date 01/15/23      OT LONG TERM GOAL #5   Title R grip strength increase for pt to carry more than 5 lbs for groceries and playing with grand daughter without symptoms    Baseline Grip strength 25 R , L 38 - discomfort and pull with carry 3 lbs -doing 2 lbs forearm and wrist -and 1 /2 for elbow /shoulder    Time 6    Period Weeks    Status New    Target Date 01/15/23                   Plan - 12/06/22 1406     Clinical Impression Statement Patient present at OT evaluation with a right Transcondylar humeral distal fracture with ORIF.  With ulnar collateral ligament repair.  Surgery was done 09/20/2022. Pt is about 11 1/2 wks s/p -  Pt demonstrates elbow flexion 142 and ext  -8 . Pt seen Dr Carola Frost last week  and doing well - per Dr Carola Frost mild HO -and progessing well to cont motion and cont with increasing resistance slowly.  REinforce with pt again not to  push end range and over do exercises especially strenghtening  HEP.  Pt doing 1 1/2 lbs for elbow and shoulder   and  2 lbs for wrist and forearm pain  free. Tolerating well and no increase pain or issues. Pt is doing her own exercises too using 1 1/2 lbs.   This date pt able to push door open and pull- open car door, turn doorknob and carry 3 lbs with more ease. Pt able to use her hand more in making bed, cooking and laundry.    Patient was very active prior to fall doing yoga, hand/head stands as well as weights and walking 3-1/2 miles a day.  Pt continues to benefit from cues and reinstruction of proper technique with exercises.    Patient limited in functional use of right dominant hand and arm in ADLs and IADLs but has made significant progress in motion/ strength and use since evaluation.  Patient can benefit from skilled OT services decrease edema and scar tissue as well as pain and motion and strength to return to prior level of motion.    OT Occupational Profile and History Problem Focused Assessment - Including review of records relating to presenting problem    Occupational performance deficits (Please refer to evaluation for details): ADL's;IADL's;Play;Social Participation;Leisure    Body Structure / Function / Physical Skills ADL;Edema;Decreased knowledge of precautions;Flexibility;IADL;Pain;Strength;Scar mobility;ROM;UE functional use    Rehab Potential Good    Clinical Decision Making Limited treatment options, no task modification necessary    Comorbidities Affecting Occupational Performance: None    Modification or Assistance to Complete Evaluation  No modification of tasks or assist necessary to complete eval    OT Frequency 2x / week    OT Duration 6 weeks    OT Treatment/Interventions Self-care/ADL training;Moist Heat;Paraffin;Contrast Bath;Therapeutic exercise;Manual Therapy;Passive range of motion;Scar mobilization;Splinting;Patient/family education    Consulted and Agree with Plan of Care Patient             Patient will benefit from skilled therapeutic intervention in order to improve the following deficits and  impairments:   Body Structure / Function / Physical Skills: ADL, Edema, Decreased knowledge of precautions, Flexibility, IADL, Pain, Strength, Scar mobility, ROM, UE functional use       Visit Diagnosis: Pain in right elbow  Muscle weakness (generalized)  Scar condition and fibrosis of skin  Stiffness of right elbow, not elsewhere classified    Problem List Patient Active Problem List   Diagnosis Date Noted   CAD (coronary artery disease) 03/08/2022   Prediabetes 01/27/2021   Palpitations 01/27/2021   Left axillary pain 12/21/2019   Osteoporosis 12/21/2019   Fatigue 12/21/2019   Familial hyperlipidemia 11/10/2019   Positive ANA (antinuclear antibody) 11/10/2019   Anxiety 11/10/2019   Murmur 11/10/2019    Oletta Cohn, OTR/L,CLT 12/06/2022, 7:46 PM  [facility] [address], [place], [address] Phone: [phone]   Fax:  [phone]  Name: Cynthia Collins MRN: [id number] Date of Birth: 14-Jul-1946

## 2022-12-10 ENCOUNTER — Ambulatory Visit: Payer: Medicare Other | Admitting: Occupational Therapy

## 2022-12-10 DIAGNOSIS — M25621 Stiffness of right elbow, not elsewhere classified: Secondary | ICD-10-CM

## 2022-12-10 DIAGNOSIS — M25521 Pain in right elbow: Secondary | ICD-10-CM

## 2022-12-10 DIAGNOSIS — L905 Scar conditions and fibrosis of skin: Secondary | ICD-10-CM

## 2022-12-10 DIAGNOSIS — M6281 Muscle weakness (generalized): Secondary | ICD-10-CM

## 2022-12-13 ENCOUNTER — Ambulatory Visit: Payer: Medicare Other | Admitting: Occupational Therapy

## 2022-12-13 ENCOUNTER — Encounter: Payer: Self-pay | Admitting: Occupational Therapy

## 2022-12-13 DIAGNOSIS — M6281 Muscle weakness (generalized): Secondary | ICD-10-CM

## 2022-12-13 DIAGNOSIS — M25521 Pain in right elbow: Secondary | ICD-10-CM

## 2022-12-13 DIAGNOSIS — M25621 Stiffness of right elbow, not elsewhere classified: Secondary | ICD-10-CM

## 2022-12-13 DIAGNOSIS — L905 Scar conditions and fibrosis of skin: Secondary | ICD-10-CM

## 2022-12-13 NOTE — Therapy (Signed)
[facility] [address], [place], [address] Phone: [phone]   Fax:  [phone]  Occupational Therapy Treatment  Patient Details  Name: Cynthia Collins MRN: [id number] Date of Birth: Apr 26, 1946 Referring Provider (OT): DR Carola Frost   Encounter Date: 12/10/2022   OT End of Session - 12/13/22 0827     Visit Number 18    Number of Visits 26    Date for OT Re-Evaluation 01/15/23    OT Start Time 1345    OT Stop Time 1441    OT Time Calculation (min) 56 min    Activity Tolerance Patient tolerated treatment well    Behavior During Therapy [facility] for tasks assessed/performed             Past Medical History:  Diagnosis Date   Allergy    Anxiety    Asthma    Complication of anesthesia    slow to wake up   Heart murmur    Hyperlipidemia    Paroxysmal SVT (supraventricular tachycardia)    Pre-diabetes    Prediabetes    Squamous cell carcinoma of skin 08/03/2021   Mid chest - [facility] ED&C 09/07/21    Past Surgical History:  Procedure Laterality Date   APPENDECTOMY  08/20/1968   CESAREAN SECTION  08/20/1984   OOPHORECTOMY Right 08/20/1968   ORIF HUMERUS FRACTURE Right 09/20/2022   Procedure: OPEN REDUCTION INTERNAL FIXATION (ORIF) DISTAL HUMERUS FRACTURE;  Surgeon: Myrene Galas, MD;  Location: [facility] OR;  Service: Orthopedics;  Laterality: Right;   SKIN CANCER EXCISION      There were no vitals filed for this visit.   Subjective Assessment - 12/13/22 0825     Subjective  Pt reports she is doing good, still using 1.5# weights, concerns about increasing weights and activity and wondering if her arm is stable.  "i just don't want to mess anything up"  "How do I know when its time to increase the weight?"    Pertinent History Pt fell on 09/01/22 - seen at ED - refer to Ortho - diagnosis of RIGHT HUMERUS TRANSCONDYALR FRACTURE      POST-OPERATIVE DIAGNOSIS:    1. RIGHT HUMERUS TRANSCONDYALR FRACTURE   2. RIGHT RADIAL  HEAD FRACTURE     PROCEDURE:  Procedure(s):  1. OPEN REDUCTION INTERNAL FIXATION (ORIF) DISTAL HUMERUS FRACTURE (Right)  2. OPEN TREATMENT OF RADIAL HEAD FRACTURE  3. REPAIR OF LATERAL ULNAR COLLATERAL LIGAMENT - Surgery on 09/20/22 and refer to OT to initiate ROM    Patient Stated Goals I want my motion and strenght back so I can stand on my hands and head in yoga again, lift weigths    Currently in Pain? No/denies    Pain Score 0-No pain              Moist heat to elbow for 5 mins prior to therapeutic exercises to increase tissue mobility and active range of motion.  AROM of left UE in all motions prior to strengthening exercises Elbow flexion ext 1 1/2 lbs, palm up and thumb up 12 reps each   Biodex 5 pounds bilaterally using left hand more than right, 2 x 10 reps, full range and from 90 to extension Scapular retraction 5 pounds bilaterally  2 x 10 reps pain-free Pulling with L and assist with R, chest press 5 lbs - controlling core 12 reps -each side  Rowing 2 x 10 reps 5 lbs on biodex   Pt able to do unsupported  2 lbs for forearm  Over armrest wrist flexion, ext Standing for RD and UD cues for proper form and technique for isolating movements For HEP perform: 2 x 12 reps pain free , 2 x day   Diagonal patterns at the wall for 2 sets of 10 reps each, 2# Discussed moving up from 1.5# weight to 2# and modifications to reps/sets when increasing weight.   UBE 4 minutes forward and back 2 minutes each pain-free       OT Education - 12/13/22 0827     Education Details progress and changes in HEP    Person(s) Educated Patient    Methods Explanation;Demonstration;Tactile cues;Handout    Comprehension Verbal cues required;Returned demonstration;Verbalized understanding                 OT Long Term Goals - 12/04/22 1441       OT LONG TERM GOAL #1   Title Patient to be independent in home program to initiate massage to increase flexion extension and right arm today to  feed self.    Status Achieved      OT LONG TERM GOAL #2   Title Right elbow flexion increased for patient to be able to wash hair and make ponytail and put earrings on without increase symptoms.    Status Achieved      OT LONG TERM GOAL #3   Title Right elbow extension and 2-10 for patient to reach head as well as into pocket without increase symptoms.  It    Status Achieved      OT LONG TERM GOAL #4   Title Strength in right elbow improved patient to be able to push and pull heavy door as well as can really more than 5 pounds without increase symptoms    Baseline Strenghthening pt doing 1 1/2 lbs - has increase pull with 3 lbs at elbow - no weightbearing yet    Time 6    Period Weeks    Status On-going    Target Date 01/15/23      OT LONG TERM GOAL #5   Title R grip strength increase for pt to carry more than 5 lbs for groceries and playing with grand daughter without symptoms    Baseline Grip strength 25 R , L 38 - discomfort and pull with carry 3 lbs -doing 2 lbs forearm and wrist -and 1 /2 for elbow /shoulder    Time 6    Period Weeks    Status New    Target Date 01/15/23                   Plan - 12/13/22 3474     Clinical Impression Statement Patient present at OT evaluation with a right Transcondylar humeral distal fracture with ORIF.  With ulnar collateral ligament repair.  Surgery was done 09/20/2022. Pt is about 11 1/2 wks s/p -  Pt demonstrates elbow flexion 142 and ext  -8 . Pt seen Dr Carola Frost last week  and doing well - per Dr Carola Frost mild HO -and progessing well to cont motion and cont with increasing resistance slowly.  REinforce with pt again not to push end range and over do exercises especially strenghtening  HEP.  Pt doing 1 1/2 lbs for elbow and shoulder   and  2 lbs for wrist and forearm pain free. Tolerating well and no increase pain or issues. Pt is doing her own exercises too using 1 1/2 lbs.   This date pt able  to push door open and pull- open car door, turn  doorknob and carry 3 lbs with more ease. Pt able to use her hand more in making bed, cooking and laundry.    Patient was very active prior to fall doing yoga, hand/head stands as well as weights and walking 3-1/2 miles a day.  Pt continues to benefit from cues and reinstruction of proper technique with exercises.    Patient limited in functional use of right dominant hand and arm in ADLs and IADLs but has made significant progress in motion/ strength and use since evaluation.  Patient can benefit from skilled OT services decrease edema and scar tissue as well as pain and motion and strength to return to prior level of motion.    OT Occupational Profile and History Problem Focused Assessment - Including review of records relating to presenting problem    Occupational performance deficits (Please refer to evaluation for details): ADL's;IADL's;Play;Social Participation;Leisure    Body Structure / Function / Physical Skills ADL;Edema;Decreased knowledge of precautions;Flexibility;IADL;Pain;Strength;Scar mobility;ROM;UE functional use    Rehab Potential Good    Clinical Decision Making Limited treatment options, no task modification necessary    Comorbidities Affecting Occupational Performance: None    Modification or Assistance to Complete Evaluation  No modification of tasks or assist necessary to complete eval    OT Frequency 2x / week    OT Duration 6 weeks    OT Treatment/Interventions Self-care/ADL training;Moist Heat;Paraffin;Contrast Bath;Therapeutic exercise;Manual Therapy;Passive range of motion;Scar mobilization;Splinting;Patient/family education    Consulted and Agree with Plan of Care Patient             Patient will benefit from skilled therapeutic intervention in order to improve the following deficits and impairments:   Body Structure / Function / Physical Skills: ADL, Edema, Decreased knowledge of precautions, Flexibility, IADL, Pain, Strength, Scar mobility, ROM, UE functional use        Visit Diagnosis: Muscle weakness (generalized)  Pain in right elbow  Scar condition and fibrosis of skin  Stiffness of right elbow, not elsewhere classified    Problem List Patient Active Problem List   Diagnosis Date Noted   CAD (coronary artery disease) 03/08/2022   Prediabetes 01/27/2021   Palpitations 01/27/2021   Left axillary pain 12/21/2019   Osteoporosis 12/21/2019   Fatigue 12/21/2019   Familial hyperlipidemia 11/10/2019   Positive ANA (antinuclear antibody) 11/10/2019   Anxiety 11/10/2019   Murmur 11/10/2019   Nature Kueker T Millie Forde, OTR/L, CLT  Moni Rothrock, OT 12/13/2022, 8:46 AM  [facility] [address] [place], [address] Phone: [phone]   Fax:  [phone]  Name: Cynthia Collins MRN: [id number] Date of Birth: 1946/06/24

## 2022-12-13 NOTE — Therapy (Signed)
[facility] [address] [place], [place], [address] Phone: [phone]   Fax:  [phone]  Occupational Therapy Treatment  Patient Details  Name: Cynthia Collins MRN: [id number] Date of Birth: Jan 11, 1946 Referring Provider (OT): DR Carola Frost   Encounter Date: 12/13/2022   OT End of Session - 12/13/22 1314     Visit Number 19    Number of Visits 26    Date for OT Re-Evaluation 01/15/23    OT Start Time 1315    OT Stop Time 1400    OT Time Calculation (min) 45 min    Activity Tolerance Patient tolerated treatment well    Behavior During Therapy [facility] for tasks assessed/performed             Past Medical History:  Diagnosis Date   Allergy    Anxiety    Asthma    Complication of anesthesia    slow to wake up   Heart murmur    Hyperlipidemia    Paroxysmal SVT (supraventricular tachycardia)    Pre-diabetes    Prediabetes    Squamous cell carcinoma of skin 08/03/2021   Mid chest - [facility] ED&C 09/07/21    Past Surgical History:  Procedure Laterality Date   APPENDECTOMY  08/20/1968   CESAREAN SECTION  08/20/1984   OOPHORECTOMY Right 08/20/1968   ORIF HUMERUS FRACTURE Right 09/20/2022   Procedure: OPEN REDUCTION INTERNAL FIXATION (ORIF) DISTAL HUMERUS FRACTURE;  Surgeon: Myrene Galas, MD;  Location: [facility] OR;  Service: Orthopedics;  Laterality: Right;   SKIN CANCER EXCISION      There were no vitals filed for this visit.   Subjective Assessment - 12/13/22 1314     Subjective  I have up my exercises to 2 lbs but decrease my reps and sets - I did do shoulder stand yesterday without using my arms-and it felt so good    Pertinent History Pt fell on 09/01/22 - seen at ED - refer to Ortho - diagnosis of RIGHT HUMERUS TRANSCONDYALR FRACTURE      POST-OPERATIVE DIAGNOSIS:    1. RIGHT HUMERUS TRANSCONDYALR FRACTURE   2. RIGHT RADIAL HEAD FRACTURE     PROCEDURE:  Procedure(s):  1. OPEN REDUCTION INTERNAL FIXATION (ORIF) DISTAL  HUMERUS FRACTURE (Right)  2. OPEN TREATMENT OF RADIAL HEAD FRACTURE  3. REPAIR OF LATERAL ULNAR COLLATERAL LIGAMENT - Surgery on 09/20/22 and refer to OT to initiate ROM    Patient Stated Goals I want my motion and strenght back so I can stand on my hands and head in yoga again, lift weigths    Currently in Pain? No/denies             Wand with 3 pound weight done overhead and standing 15 reps Wand with 3 pounds in supine done chest presses as well as overhead flexion, elbow extension and horizontal abduction 12 reps each pain-free. Standing Wand with 3 pound weight done elbow flexion and extension in standing 12 reps 1 kg ball on wall with extended wrist and elbow shoulder flexion 90 degrees 4 x 30 seconds for stabilization and elbow extension Patient was able to stay to carry 5 pounds pain-free. Patient decreased to 1 time a week.  Patient to increase functional use of right hand with a limit of 3 to 4 pounds but pain-free and symptom-free in her ADLs.  NuStep done at resistance 1 for 6 minutes.  Tolerating well    Patient to continue with her home program 2 pounds for  half of the reps and sets. If pain-free can increase in 3 days to doing 2 pounds 12 reps.                OT Education - 12/13/22 1314     Education Details progress and changes in HEP    Person(s) Educated Patient    Methods Explanation;Demonstration;Tactile cues;Handout    Comprehension Verbal cues required;Returned demonstration;Verbalized understanding                 OT Long Term Goals - 12/04/22 1441       OT LONG TERM GOAL #1   Title Patient to be independent in home program to initiate massage to increase flexion extension and right arm today to feed self.    Status Achieved      OT LONG TERM GOAL #2   Title Right elbow flexion increased for patient to be able to wash hair and make ponytail and put earrings on without increase symptoms.    Status Achieved      OT LONG TERM GOAL #3    Title Right elbow extension and 2-10 for patient to reach head as well as into pocket without increase symptoms.  It    Status Achieved      OT LONG TERM GOAL #4   Title Strength in right elbow improved patient to be able to push and pull heavy door as well as can really more than 5 pounds without increase symptoms    Baseline Strenghthening pt doing 1 1/2 lbs - has increase pull with 3 lbs at elbow - no weightbearing yet    Time 6    Period Weeks    Status On-going    Target Date 01/15/23      OT LONG TERM GOAL #5   Title R grip strength increase for pt to carry more than 5 lbs for groceries and playing with grand daughter without symptoms    Baseline Grip strength 25 R , L 38 - discomfort and pull with carry 3 lbs -doing 2 lbs forearm and wrist -and 1 /2 for elbow /shoulder    Time 6    Period Weeks    Status New    Target Date 01/15/23                   Plan - 12/13/22 1314     Clinical Impression Statement Patient present at OT evaluation with a right Transcondylar humeral distal fracture with ORIF.  With ulnar collateral ligament repair.  Surgery was done 09/20/2022. Pt is about 12 wks s/p -  Pt demonstrates elbow flexion 142 and ext  -8 .  Patient reports she increased her home exercises to 2 pounds for shoulder, elbow and wrist since last visit.  Did decrease the reps to have in doing the other half so 1-1/2 pounds.  No increased pain but did feel a pop during her stretch but felt she had increased motion after that.  Appears swelling is decreasing at right elbow.  Was able to increase her exercises today in clinic to 3 pounds on wand for shoulder  and elbow-  as well as patient able to carry 5 pounds in clinic without any symptoms.  Appear patient is getting more confidence about using her hand encouraged her to use her hand in anything less than 3 or 4 pounds without pain.  Pt continues to benefit from cues and reinstruction of proper technique with exercises.    Patient  limited in  functional use of right dominant hand and arm in ADLs and IADLs but has made significant progress in motion/ strength and use since evaluation.  Patient is decreased to 1 time a week.  Patient can benefit from skilled OT services decrease edema and scar tissue as well as pain and motion and strength to return to prior level of motion.    OT Occupational Profile and History Problem Focused Assessment - Including review of records relating to presenting problem    Occupational performance deficits (Please refer to evaluation for details): ADL's;IADL's;Play;Social Participation;Leisure    Body Structure / Function / Physical Skills ADL;Edema;Decreased knowledge of precautions;Flexibility;IADL;Pain;Strength;Scar mobility;ROM;UE functional use    Rehab Potential Good    Clinical Decision Making Limited treatment options, no task modification necessary    Comorbidities Affecting Occupational Performance: None    Modification or Assistance to Complete Evaluation  No modification of tasks or assist necessary to complete eval    OT Frequency 2x / week    OT Duration 6 weeks    OT Treatment/Interventions Self-care/ADL training;Moist Heat;Paraffin;Contrast Bath;Therapeutic exercise;Manual Therapy;Passive range of motion;Scar mobilization;Splinting;Patient/family education    Consulted and Agree with Plan of Care Patient             Patient will benefit from skilled therapeutic intervention in order to improve the following deficits and impairments:   Body Structure / Function / Physical Skills: ADL, Edema, Decreased knowledge of precautions, Flexibility, IADL, Pain, Strength, Scar mobility, ROM, UE functional use       Visit Diagnosis: Pain in right elbow  Muscle weakness (generalized)  Scar condition and fibrosis of skin  Stiffness of right elbow, not elsewhere classified    Problem List Patient Active Problem List   Diagnosis Date Noted   CAD (coronary artery disease)  03/08/2022   Prediabetes 01/27/2021   Palpitations 01/27/2021   Left axillary pain 12/21/2019   Osteoporosis 12/21/2019   Fatigue 12/21/2019   Familial hyperlipidemia 11/10/2019   Positive ANA (antinuclear antibody) 11/10/2019   Anxiety 11/10/2019   Murmur 11/10/2019    Oletta Cohn, OTR/L,CLT 12/13/2022, 3:37 PM  [facility] [address], [place], [address] Phone: [phone]   Fax:  [phone]  Name: Cynthia Collins MRN: [id number] Date of Birth: [birth year]

## 2022-12-17 ENCOUNTER — Ambulatory Visit: Payer: Medicare Other | Admitting: Occupational Therapy

## 2022-12-20 ENCOUNTER — Ambulatory Visit: Payer: Medicare Other | Attending: Orthopedic Surgery | Admitting: Occupational Therapy

## 2022-12-20 ENCOUNTER — Encounter: Payer: Self-pay | Admitting: Occupational Therapy

## 2022-12-20 DIAGNOSIS — L905 Scar conditions and fibrosis of skin: Secondary | ICD-10-CM | POA: Insufficient documentation

## 2022-12-20 DIAGNOSIS — M25621 Stiffness of right elbow, not elsewhere classified: Secondary | ICD-10-CM

## 2022-12-20 DIAGNOSIS — M25521 Pain in right elbow: Secondary | ICD-10-CM | POA: Diagnosis present

## 2022-12-20 DIAGNOSIS — M6281 Muscle weakness (generalized): Secondary | ICD-10-CM | POA: Insufficient documentation

## 2022-12-20 NOTE — Therapy (Signed)
[facility] [address] [place], [place], [address] Phone: [phone]   Fax:  [phone]  Occupational Therapy Treatment/20 visits  Patient Details  Name: Cynthia Collins MRN: [id number] Date of Birth: March 05, 1946 Referring Provider (OT): DR Carola Frost   Encounter Date: 12/20/2022   OT End of Session - 12/20/22 2040     Visit Number 20    Number of Visits 26    Date for OT Re-Evaluation 01/15/23    OT Start Time 1320    OT Stop Time 1400    OT Time Calculation (min) 40 min    Activity Tolerance Patient tolerated treatment well    Behavior During Therapy [facility] for tasks assessed/performed             Past Medical History:  Diagnosis Date   Allergy    Anxiety    Asthma    Complication of anesthesia    slow to wake up   Heart murmur    Hyperlipidemia    Paroxysmal SVT (supraventricular tachycardia)    Pre-diabetes    Prediabetes    Squamous cell carcinoma of skin 08/03/2021   Mid chest - [facility] ED&C 09/07/21    Past Surgical History:  Procedure Laterality Date   APPENDECTOMY  08/20/1968   CESAREAN SECTION  08/20/1984   OOPHORECTOMY Right 08/20/1968   ORIF HUMERUS FRACTURE Right 09/20/2022   Procedure: OPEN REDUCTION INTERNAL FIXATION (ORIF) DISTAL HUMERUS FRACTURE;  Surgeon: Myrene Galas, MD;  Location: [facility] OR;  Service: Orthopedics;  Laterality: Right;   SKIN CANCER EXCISION      There were no vitals filed for this visit.   Subjective Assessment - 12/20/22 2039     Subjective  I started doing more of my exercises with 2 lbs and 3 lbs - but not over head- some push ups on wall - can tell I can do more things around the house    Pertinent History Pt fell on 09/01/22 - seen at ED - refer to Ortho - diagnosis of RIGHT HUMERUS TRANSCONDYALR FRACTURE      POST-OPERATIVE DIAGNOSIS:    1. RIGHT HUMERUS TRANSCONDYALR FRACTURE   2. RIGHT RADIAL HEAD FRACTURE     PROCEDURE:  Procedure(s):  1. OPEN REDUCTION INTERNAL  FIXATION (ORIF) DISTAL HUMERUS FRACTURE (Right)  2. OPEN TREATMENT OF RADIAL HEAD FRACTURE  3. REPAIR OF LATERAL ULNAR COLLATERAL LIGAMENT - Surgery on 09/20/22 and refer to OT to initiate ROM    Patient Stated Goals I want my motion and strenght back so I can stand on my hands and head in yoga again, lift weigths    Currently in Pain? No/denies             Patient arrived today with reports of increased functional use of right dominant hand at home.  Caring about three-quarter of a gallon. Able to state to push and pull heavy door without any issues.  Carrying left 5 pounds.  Could carry 6 pounds.  Had some discomfort in the elbow with a 7 pounds. Patient reports using a 3 pound weight and some of her elbow and shoulder exercises to shoulder height. Using her hand in household and did some vacuuming.  But not doing things like heavy laundry basket or salt for her water or fitting sheet. This date patient done 3 pounds shoulder punches as well as overhead press 12 reps pain-free Done planks on wall 3 elbow.  Patient needed tactile and verbal cues 75% of the time  for good core and posture.  7 x 1 minute. Also done planks through palm on the wall with 50 to 75% cueing for posture 3 x 1 minute Patient" position on mat.  Can lower onto elbow and hands alternating left and right hand 12 reps pain-free No weightbearing on only single hand or elbow on the right. Patient decreased to 1 time a week.  Patient to increase functional use of right hand with a limit of 4 pounds but pain-free and symptom-free in her ADLs.                           OT Education - 12/20/22 2040     Education Details progress and changes in HEP                 OT Long Term Goals - 12/04/22 1441       OT LONG TERM GOAL #1   Title Patient to be independent in home program to initiate massage to increase flexion extension and right arm today to feed self.    Status Achieved      OT LONG TERM  GOAL #2   Title Right elbow flexion increased for patient to be able to wash hair and make ponytail and put earrings on without increase symptoms.    Status Achieved      OT LONG TERM GOAL #3   Title Right elbow extension and 2-10 for patient to reach head as well as into pocket without increase symptoms.  It    Status Achieved      OT LONG TERM GOAL #4   Title Strength in right elbow improved patient to be able to push and pull heavy door as well as can really more than 5 pounds without increase symptoms    Baseline Strenghthening pt doing 1 1/2 lbs - has increase pull with 3 lbs at elbow - no weightbearing yet    Time 6    Period Weeks    Status On-going    Target Date 01/15/23      OT LONG TERM GOAL #5   Title R grip strength increase for pt to carry more than 5 lbs for groceries and playing with grand daughter without symptoms    Baseline Grip strength 25 R , L 38 - discomfort and pull with carry 3 lbs -doing 2 lbs forearm and wrist -and 1 /2 for elbow /shoulder    Time 6    Period Weeks    Status New    Target Date 01/15/23                   Plan - 12/20/22 2041     Clinical Impression Statement Patient present at OT evaluation with a right Transcondylar humeral distal fracture with ORIF.  With ulnar collateral ligament repair.  Surgery was done 09/20/2022. Pt is 3 months out from surgery. Made great progress in 20 vistis in AROM and strength in R elbow . Extention -8 and flexoin 142 degrees.  Patient reports she increase use at home in ADL's and IADL"s - able to do most every thing except heavy activities- pt can carry and lift about 5 lbs and carry 6 lbs- but with 7 lbs she feels pull in elbow and very heavy.  Able to initiate some planks on wall thru elbow  and bilateral quad postion on mat- alternate pushing up. Pt able to do upper body to shoulder height with 3  lbs but to hold off on over head. Pt cont to show increase confidence in using her R UE at home.   Pt continues  to benefit from cues and reinstruction of proper technique with exercises.    Patient limited in functional use of right dominant hand and arm in ADLs and IADLs but has made significant progress in motion/ strength and use since evaluation.  Patient is decreased to 1 time a week.  Patient can benefit from skilled OT services decrease edema and scar tissue as well as pain and motion and strength to return to prior level of motion.    OT Occupational Profile and History Problem Focused Assessment - Including review of records relating to presenting problem    Occupational performance deficits (Please refer to evaluation for details): ADL's;IADL's;Play;Social Participation;Leisure    Body Structure / Function / Physical Skills ADL;Edema;Decreased knowledge of precautions;Flexibility;IADL;Pain;Strength;Scar mobility;ROM;UE functional use    Rehab Potential Good    Clinical Decision Making Limited treatment options, no task modification necessary    Comorbidities Affecting Occupational Performance: None    Modification or Assistance to Complete Evaluation  No modification of tasks or assist necessary to complete eval    OT Frequency 1x / week    OT Duration 4 weeks    OT Treatment/Interventions Self-care/ADL training;Moist Heat;Paraffin;Contrast Bath;Therapeutic exercise;Manual Therapy;Passive range of motion;Scar mobilization;Splinting;Patient/family education    Consulted and Agree with Plan of Care Patient             Patient will benefit from skilled therapeutic intervention in order to improve the following deficits and impairments:   Body Structure / Function / Physical Skills: ADL, Edema, Decreased knowledge of precautions, Flexibility, IADL, Pain, Strength, Scar mobility, ROM, UE functional use       Visit Diagnosis: Pain in right elbow  Muscle weakness (generalized)  Scar condition and fibrosis of skin  Stiffness of right elbow, not elsewhere classified    Problem  List Patient Active Problem List   Diagnosis Date Noted   CAD (coronary artery disease) 03/08/2022   Prediabetes 01/27/2021   Palpitations 01/27/2021   Left axillary pain 12/21/2019   Osteoporosis 12/21/2019   Fatigue 12/21/2019   Familial hyperlipidemia 11/10/2019   Positive ANA (antinuclear antibody) 11/10/2019   Anxiety 11/10/2019   Murmur 11/10/2019    Oletta Cohn, OTR/L,CLT 12/20/2022, 8:46 PM  [facility] [address], [place], [address] Phone: [phone]   Fax:  [phone]  Name: Cynthia Collins MRN: [id number] Date of Birth: 09/02/1945

## 2022-12-27 ENCOUNTER — Ambulatory Visit: Payer: Medicare Other | Admitting: Occupational Therapy

## 2022-12-27 ENCOUNTER — Encounter: Payer: Self-pay | Admitting: Occupational Therapy

## 2022-12-27 DIAGNOSIS — M25621 Stiffness of right elbow, not elsewhere classified: Secondary | ICD-10-CM

## 2022-12-27 DIAGNOSIS — L905 Scar conditions and fibrosis of skin: Secondary | ICD-10-CM

## 2022-12-27 DIAGNOSIS — M25521 Pain in right elbow: Secondary | ICD-10-CM | POA: Diagnosis not present

## 2022-12-27 DIAGNOSIS — M6281 Muscle weakness (generalized): Secondary | ICD-10-CM

## 2022-12-27 NOTE — Therapy (Signed)
[facility] [address] [place], [address] Phone: [phone]   Fax:  [phone]  Occupational Therapy Treatment  Patient Details  Name: Cynthia Collins MRN: [id number] Date of Birth: 09-Nov-1945 Referring Provider (OT): DR Carola Frost   Encounter Date: 12/27/2022   OT End of Session - 12/27/22 1200     Visit Number 21    Number of Visits 26    Date for OT Re-Evaluation 01/15/23    OT Start Time 1115    OT Stop Time 1139    OT Time Calculation (min) 24 min    Activity Tolerance Patient tolerated treatment well    Behavior During Therapy [facility] for tasks assessed/performed             Past Medical History:  Diagnosis Date   Allergy    Anxiety    Asthma    Complication of anesthesia    slow to wake up   Heart murmur    Hyperlipidemia    Paroxysmal SVT (supraventricular tachycardia)    Pre-diabetes    Prediabetes    Squamous cell carcinoma of skin 08/03/2021   Mid chest - [facility] ED&C 09/07/21    Past Surgical History:  Procedure Laterality Date   APPENDECTOMY  08/20/1968   CESAREAN SECTION  08/20/1984   OOPHORECTOMY Right 08/20/1968   ORIF HUMERUS FRACTURE Right 09/20/2022   Procedure: OPEN REDUCTION INTERNAL FIXATION (ORIF) DISTAL HUMERUS FRACTURE;  Surgeon: Myrene Galas, MD;  Location: [facility] OR;  Service: Orthopedics;  Laterality: Right;   SKIN CANCER EXCISION      There were no vitals filed for this visit.   Subjective Assessment - 12/27/22 1159     Subjective  I started doing 3 pounds and most all my exercises.  Do my shoulder stand like for 2 minutes now.  My wall push-ups is more even.  Was able to carry like a three-quarter of a gallon this morning    Pertinent History Pt fell on 09/01/22 - seen at ED - refer to Ortho - diagnosis of RIGHT HUMERUS TRANSCONDYALR FRACTURE      POST-OPERATIVE DIAGNOSIS:    1. RIGHT HUMERUS TRANSCONDYALR FRACTURE   2. RIGHT RADIAL HEAD FRACTURE     PROCEDURE:  Procedure(s):   1. OPEN REDUCTION INTERNAL FIXATION (ORIF) DISTAL HUMERUS FRACTURE (Right)  2. OPEN TREATMENT OF RADIAL HEAD FRACTURE  3. REPAIR OF LATERAL ULNAR COLLATERAL LIGAMENT - Surgery on 09/20/22 and refer to OT to initiate ROM    Patient Stated Goals I want my motion and strenght back so I can stand on my hands and head in yoga again, lift weigths    Currently in Pain? No/denies                [facility] OT Assessment - 12/27/22 0001       AROM   Right Elbow Flexion 145    Right Elbow Extension -5   to -8     Strength   Right Hand Grip (lbs) 25    Right Hand Lateral Pinch 9 lbs    Right Hand 3 Point Pinch 9 lbs    Left Hand Grip (lbs) 39    Left Hand Lateral Pinch 9 lbs    Left Hand 3 Point Pinch 10 lbs               Patient arrived today with reports of increased functional use of right dominant hand at home and in her exercises routines she done  in the past prior to fall .   Caring about three-quarter of a gallon. Able to state to push and pull heavy door without any issues.  Carrying 8 lbs today and lift 7 lbs without pain   Patient reports using a 3 pound weight and some of her elbow and shoulder exercises to shoulder height. Using her hand in household and did some vacuuming.  But not doing things like heavy laundry basket or salt for her water or fitting sheet. Pt doing 3 lbs for most of the exercises at home- since the past week  Done planks on wall  and elbows.    Patient" position on mat.  Can lower onto elbow and hands alternating left and right hand 12 reps pain-free Also done lifting one leg - but R UE tremors and weak - compensate - pt to cont with 3 lbs and wall pushups  Wall pushups needed some cueing Done some child poses and modified cobra And back bends in standing and on knees Patient decreased to biweekly.                     OT Education - 12/27/22 1200     Education Details progress and changes in HEP    Person(s) Educated Patient     Methods Explanation;Demonstration;Tactile cues;Handout    Comprehension Verbal cues required;Returned demonstration;Verbalized understanding                 OT Long Term Goals - 12/04/22 1441       OT LONG TERM GOAL #1   Title Patient to be independent in home program to initiate massage to increase flexion extension and right arm today to feed self.    Status Achieved      OT LONG TERM GOAL #2   Title Right elbow flexion increased for patient to be able to wash hair and make ponytail and put earrings on without increase symptoms.    Status Achieved      OT LONG TERM GOAL #3   Title Right elbow extension and 2-10 for patient to reach head as well as into pocket without increase symptoms.  It    Status Achieved      OT LONG TERM GOAL #4   Title Strength in right elbow improved patient to be able to push and pull heavy door as well as can really more than 5 pounds without increase symptoms    Baseline Strenghthening pt doing 1 1/2 lbs - has increase pull with 3 lbs at elbow - no weightbearing yet    Time 6    Period Weeks    Status On-going    Target Date 01/15/23      OT LONG TERM GOAL #5   Title R grip strength increase for pt to carry more than 5 lbs for groceries and playing with grand daughter without symptoms    Baseline Grip strength 25 R , L 38 - discomfort and pull with carry 3 lbs -doing 2 lbs forearm and wrist -and 1 /2 for elbow /shoulder    Time 6    Period Weeks    Status New    Target Date 01/15/23                   Plan - 12/27/22 1200     Clinical Impression Statement Patient present at OT evaluation with a right Transcondylar humeral distal fracture with ORIF.  With ulnar collateral ligament repair.  Surgery was done  09/20/2022. Pt is 3 months out from surgery. Made great progress  in AROM and strength in R elbow . Extention - 5 to -8 and flexion 145 degrees.  Patient reports she has increase use at home in ADL's and IADL"s - able to do most every  thing except heavy activities- pt can carry  about 8 lbs and lift 7 lbs. Upgrade some of her HEP planks on wall thru elbow  and bilateral quad postion on mat- alternate pushing up.With weight bearing R elbow still buckles or tremor. Pt to cont to use 3 lbs for R UE and wall pushups. Pt cont to show increase confidence in using her R UE at home.   Pt continues to benefit from cues and reinstruction of proper technique with exercises.    Patient limited in functional use of right dominant hand and arm in ADLs and IADLs but has made significant progress in motion/ strength and use since evaluation.  Pt decrease to biweekly.  Patient can benefit from skilled OT services decrease edema and scar tissue as well as pain and motion and strength to return to prior level of motion.    OT Occupational Profile and History Problem Focused Assessment - Including review of records relating to presenting problem    Occupational performance deficits (Please refer to evaluation for details): ADL's;IADL's;Play;Social Participation;Leisure    Body Structure / Function / Physical Skills ADL;Edema;Decreased knowledge of precautions;Flexibility;IADL;Pain;Strength;Scar mobility;ROM;UE functional use    Rehab Potential Good    Clinical Decision Making Limited treatment options, no task modification necessary    Comorbidities Affecting Occupational Performance: None    Modification or Assistance to Complete Evaluation  No modification of tasks or assist necessary to complete eval    OT Frequency Biweekly    OT Duration 4 weeks    OT Treatment/Interventions Self-care/ADL training;Moist Heat;Paraffin;Contrast Bath;Therapeutic exercise;Manual Therapy;Passive range of motion;Scar mobilization;Splinting;Patient/family education    Consulted and Agree with Plan of Care Patient             Patient will benefit from skilled therapeutic intervention in order to improve the following deficits and impairments:   Body Structure /  Function / Physical Skills: ADL, Edema, Decreased knowledge of precautions, Flexibility, IADL, Pain, Strength, Scar mobility, ROM, UE functional use       Visit Diagnosis: Pain in right elbow  Muscle weakness (generalized)  Scar condition and fibrosis of skin  Stiffness of right elbow, not elsewhere classified    Problem List Patient Active Problem List   Diagnosis Date Noted   CAD (coronary artery disease) 03/08/2022   Prediabetes 01/27/2021   Palpitations 01/27/2021   Left axillary pain 12/21/2019   Osteoporosis 12/21/2019   Fatigue 12/21/2019   Familial hyperlipidemia 11/10/2019   Positive ANA (antinuclear antibody) 11/10/2019   Anxiety 11/10/2019   Murmur 11/10/2019    Oletta Cohn, OTR/L,CLT 12/27/2022, 12:06 PM  [facility] [address] [place], [address] Phone: [phone]   Fax:  [phone]  Name: Cynthia Collins MRN: [id number] Date of Birth: [birth year]

## 2023-01-03 ENCOUNTER — Ambulatory Visit: Payer: Medicare Other | Admitting: Occupational Therapy

## 2023-01-10 ENCOUNTER — Ambulatory Visit: Payer: Medicare Other | Admitting: Occupational Therapy

## 2023-01-10 DIAGNOSIS — L905 Scar conditions and fibrosis of skin: Secondary | ICD-10-CM

## 2023-01-10 DIAGNOSIS — M6281 Muscle weakness (generalized): Secondary | ICD-10-CM

## 2023-01-10 DIAGNOSIS — M25621 Stiffness of right elbow, not elsewhere classified: Secondary | ICD-10-CM

## 2023-01-10 DIAGNOSIS — M25521 Pain in right elbow: Secondary | ICD-10-CM | POA: Diagnosis not present

## 2023-01-10 NOTE — Therapy (Signed)
[facility] [address], [place], [address] Phone: [phone]   Fax:  [phone]  Occupational Therapy Treatment  Patient Details  Name: Cynthia Collins MRN: [id number] Date of Birth: 1945-12-22 Referring Provider (OT): DR Carola Frost   Encounter Date: 01/10/2023   OT End of Session - 01/10/23 1458     Visit Number 22    Number of Visits 26    Date for OT Re-Evaluation 01/15/23    OT Start Time 1315    OT Stop Time 1358    OT Time Calculation (min) 43 min    Activity Tolerance Patient tolerated treatment well    Behavior During Therapy [facility] for tasks assessed/performed             Past Medical History:  Diagnosis Date   Allergy    Anxiety    Asthma    Complication of anesthesia    slow to wake up   Heart murmur    Hyperlipidemia    Paroxysmal SVT (supraventricular tachycardia)    Pre-diabetes    Prediabetes    Squamous cell carcinoma of skin 08/03/2021   Mid chest - [facility] ED&C 09/07/21    Past Surgical History:  Procedure Laterality Date   APPENDECTOMY  08/20/1968   CESAREAN SECTION  08/20/1984   OOPHORECTOMY Right 08/20/1968   ORIF HUMERUS FRACTURE Right 09/20/2022   Procedure: OPEN REDUCTION INTERNAL FIXATION (ORIF) DISTAL HUMERUS FRACTURE;  Surgeon: Myrene Galas, MD;  Location: [facility] OR;  Service: Orthopedics;  Laterality: Right;   SKIN CANCER EXCISION      There were no vitals filed for this visit.   Subjective Assessment - 01/10/23 1457     Subjective  I am doing 4 to 4 1/2 lbs with my R UE and 5 lbs with my L - doing my routine from prior to fall and your exercises- as well as some of my Yoga ones    Pertinent History Pt fell on 09/01/22 - seen at ED - refer to Ortho - diagnosis of RIGHT HUMERUS TRANSCONDYALR FRACTURE      POST-OPERATIVE DIAGNOSIS:    1. RIGHT HUMERUS TRANSCONDYALR FRACTURE   2. RIGHT RADIAL HEAD FRACTURE     PROCEDURE:  Procedure(s):  1. OPEN REDUCTION INTERNAL FIXATION (ORIF)  DISTAL HUMERUS FRACTURE (Right)  2. OPEN TREATMENT OF RADIAL HEAD FRACTURE  3. REPAIR OF LATERAL ULNAR COLLATERAL LIGAMENT - Surgery on 09/20/22 and refer to OT to initiate ROM    Patient Stated Goals I want my motion and strenght back so I can stand on my hands and head in yoga again, lift weigths    Currently in Pain? No/denies             Patient arrived today with reports of increased functional use of right dominant hand at home and in her exercises routines she done in the past prior to fall .   Caring about three-quarter of a gallon. Able to state to push and pull heavy door without any issues.  Carrying 8 lbs today and lift 7 lbs without pain    Patient reports using a 3 -4 pound weight  with her shoulder and  elbow exercises.. Using her hand in household and did some vacuuming.  But not doing things like heavy laundry basket or salt for her water or fitting sheet.    Done planks on wall  and elbows.     Patient" position on mat.  Can lower onto elbow and hands  alternating left and right hand 12 reps pain-free Also done lifting one leg - but R UE tremors and weak - compensate - pt to cont with 3 lbs and wall pushups Cobra<> childs pose Back bends Super man   Patient decreased to biweekly.                             OT Education - 01/10/23 1458     Education Details progress and changes in HEP    Person(s) Educated Patient    Methods Explanation;Demonstration;Tactile cues;Handout    Comprehension Verbal cues required;Returned demonstration;Verbalized understanding                 OT Long Term Goals - 12/04/22 1441       OT LONG TERM GOAL #1   Title Patient to be independent in home program to initiate massage to increase flexion extension and right arm today to feed self.    Status Achieved      OT LONG TERM GOAL #2   Title Right elbow flexion increased for patient to be able to wash hair and make ponytail and put earrings on without  increase symptoms.    Status Achieved      OT LONG TERM GOAL #3   Title Right elbow extension and 2-10 for patient to reach head as well as into pocket without increase symptoms.  It    Status Achieved      OT LONG TERM GOAL #4   Title Strength in right elbow improved patient to be able to push and pull heavy door as well as can really more than 5 pounds without increase symptoms    Baseline Strenghthening pt doing 1 1/2 lbs - has increase pull with 3 lbs at elbow - no weightbearing yet    Time 6    Period Weeks    Status On-going    Target Date 01/15/23      OT LONG TERM GOAL #5   Title R grip strength increase for pt to carry more than 5 lbs for groceries and playing with grand daughter without symptoms    Baseline Grip strength 25 R , L 38 - discomfort and pull with carry 3 lbs -doing 2 lbs forearm and wrist -and 1 /2 for elbow /shoulder    Time 6    Period Weeks    Status New    Target Date 01/15/23                   Plan - 01/10/23 1458     Clinical Impression Statement Patient present at OT evaluation with a right Transcondylar humeral distal fracture with ORIF.  With ulnar collateral ligament repair.  Surgery was done 09/20/2022. Pt is more than 3 1/2 months out from surgery. Made great progress  in AROM and strength in R elbow . Extention - 5 to -8 and flexion 145 degrees.  Patient reports she has increase use at home in ADL's and IADL"s - able to do most every thing except heavy activities- pt can carry  about 8 lbs and lift 7 lbs.  Review this date with pt using 3-4 lbs for R UE in shoulder and elbow HEP -review the routine she done prior to fall and had no issues or pain. Also review with her  her yoga routines.  Pt cont to show increase confidence in using her R UE at home.   Pt continues to benefit  from cues and reinstruction of proper technique with exercises.    Patient limited in functional use of right dominant hand and arm in ADLs and IADLs but has made significant  progress in motion/ strength and use since evaluation.  Pt decrease to biweekly.  Patient can benefit from skilled OT services decrease edema and scar tissue as well as pain and motion and strength to return to prior level of motion.    OT Occupational Profile and History Problem Focused Assessment - Including review of records relating to presenting problem    Occupational performance deficits (Please refer to evaluation for details): ADL's;IADL's;Play;Social Participation;Leisure    Body Structure / Function / Physical Skills ADL;Edema;Decreased knowledge of precautions;Flexibility;IADL;Pain;Strength;Scar mobility;ROM;UE functional use    Rehab Potential Good    Clinical Decision Making Limited treatment options, no task modification necessary    Comorbidities Affecting Occupational Performance: None    Modification or Assistance to Complete Evaluation  No modification of tasks or assist necessary to complete eval    OT Frequency Biweekly    OT Duration 2 weeks    OT Treatment/Interventions Self-care/ADL training;Moist Heat;Paraffin;Contrast Bath;Therapeutic exercise;Manual Therapy;Passive range of motion;Scar mobilization;Splinting;Patient/family education    Consulted and Agree with Plan of Care Patient             Patient will benefit from skilled therapeutic intervention in order to improve the following deficits and impairments:   Body Structure / Function / Physical Skills: ADL, Edema, Decreased knowledge of precautions, Flexibility, IADL, Pain, Strength, Scar mobility, ROM, UE functional use       Visit Diagnosis: Pain in right elbow  Muscle weakness (generalized)  Scar condition and fibrosis of skin  Stiffness of right elbow, not elsewhere classified    Problem List Patient Active Problem List   Diagnosis Date Noted   CAD (coronary artery disease) 03/08/2022   Prediabetes 01/27/2021   Palpitations 01/27/2021   Left axillary pain 12/21/2019   Osteoporosis  12/21/2019   Fatigue 12/21/2019   Familial hyperlipidemia 11/10/2019   Positive ANA (antinuclear antibody) 11/10/2019   Anxiety 11/10/2019   Murmur 11/10/2019    Oletta Cohn, OTR/L,CLT 01/10/2023, 10:16 PM  [facility] [address], [place], [address] Phone: [phone]   Fax:  [phone]  Name: Cynthia Collins MRN: [id number] Date of Birth: 1946-05-15

## 2023-01-17 ENCOUNTER — Encounter: Payer: Medicare Other | Admitting: Occupational Therapy

## 2023-01-24 ENCOUNTER — Ambulatory Visit: Payer: Medicare Other | Attending: Orthopedic Surgery | Admitting: Occupational Therapy

## 2023-01-24 DIAGNOSIS — M6281 Muscle weakness (generalized): Secondary | ICD-10-CM | POA: Insufficient documentation

## 2023-01-24 DIAGNOSIS — M25521 Pain in right elbow: Secondary | ICD-10-CM | POA: Diagnosis present

## 2023-01-24 DIAGNOSIS — L905 Scar conditions and fibrosis of skin: Secondary | ICD-10-CM | POA: Insufficient documentation

## 2023-01-24 DIAGNOSIS — M25621 Stiffness of right elbow, not elsewhere classified: Secondary | ICD-10-CM | POA: Diagnosis present

## 2023-01-24 NOTE — Therapy (Signed)
[facility] [address], [place], [address] Phone: [phone]   Fax:  [phone]  Occupational Therapy Treatment  Patient Details  Name: Temika Prothro MRN: [id number] Date of Birth: 1945-12-15 Referring Provider (OT): DR Carola Frost   Encounter Date: 01/24/2023   OT End of Session - 01/24/23 1314     Visit Number 23    Number of Visits 26    Date for OT Re-Evaluation 03/28/23    OT Start Time 1315    OT Stop Time 1357    OT Time Calculation (min) 42 min    Activity Tolerance Patient tolerated treatment well    Behavior During Therapy [facility] for tasks assessed/performed             Past Medical History:  Diagnosis Date   Allergy    Anxiety    Asthma    Complication of anesthesia    slow to wake up   Heart murmur    Hyperlipidemia    Paroxysmal SVT (supraventricular tachycardia)    Pre-diabetes    Prediabetes    Squamous cell carcinoma of skin 08/03/2021   Mid chest - [facility] ED&C 09/07/21    Past Surgical History:  Procedure Laterality Date   APPENDECTOMY  08/20/1968   CESAREAN SECTION  08/20/1984   OOPHORECTOMY Right 08/20/1968   ORIF HUMERUS FRACTURE Right 09/20/2022   Procedure: OPEN REDUCTION INTERNAL FIXATION (ORIF) DISTAL HUMERUS FRACTURE;  Surgeon: Myrene Galas, MD;  Location: [facility] OR;  Service: Orthopedics;  Laterality: Right;   SKIN CANCER EXCISION      There were no vitals filed for this visit.   Subjective Assessment - 01/24/23 1314     Subjective  I done something to me foot - I am still doing about 4 to 4 1/2 lbs for weight - motion good - the other day did something and had some pain tat the back of elbow tip- held off on exercises that day - using it more -carry 10lbs the other day around the house 2-4 x in the hallway -did not feel to good    Pertinent History Pt fell on 09/01/22 - seen at ED - refer to Ortho - diagnosis of RIGHT HUMERUS TRANSCONDYALR FRACTURE      POST-OPERATIVE  DIAGNOSIS:    1. RIGHT HUMERUS TRANSCONDYALR FRACTURE   2. RIGHT RADIAL HEAD FRACTURE     PROCEDURE:  Procedure(s):  1. OPEN REDUCTION INTERNAL FIXATION (ORIF) DISTAL HUMERUS FRACTURE (Right)  2. OPEN TREATMENT OF RADIAL HEAD FRACTURE  3. REPAIR OF LATERAL ULNAR COLLATERAL LIGAMENT - Surgery on 09/20/22 and refer to OT to initiate ROM    Patient Stated Goals I want my motion and strenght back so I can stand on my hands and head in yoga again, lift weigths    Currently in Pain? No/denies                [facility] OT Assessment - 01/24/23 0001       AROM   Right Elbow Flexion 145    Right Elbow Extension -5      Strength   Right Hand Grip (lbs) 31    Right Hand Lateral Pinch 11 lbs    Right Hand 3 Point Pinch 9 lbs    Left Hand Grip (lbs) 39    Left Hand Lateral Pinch 9 lbs    Left Hand 3 Point Pinch 9 lbs  Patient arrived today with reports of increased functional use of right dominant hand at home and in her exercises routines she done in the past prior to fall .   Able to state to push and pull heavy door without any issues.  Carrying 8 lbs and lift without pain    Patient reports using a 4 pound weight  with her shoulder and  elbow exercises.. Using her hand in household and did some vacuuming.  But not doing things like heavy laundry basket or salt for her water or fitting sheet. End range to do isometric for elbow flexion Pt to slowly increase weight painfree- have to be pain free prior to advancing weight  Remind her it will take year      Done planks on wall  and elbows.   Pt can switch to do all her exercises for weight bearing on wall -  Pushup one arm end range for extention , WB and plank thru hand on wall  One hand and bilateral   Patient" position on mat.  Can lower onto elbow and hands alternating left and right hand 12 reps pain-free Cobra<> childs pose Back bends Super man    Patient decreased to biweekly or 3 wks                      OT Education - 01/24/23 1314     Education Details progress and changes in HEP    Person(s) Educated Patient    Methods Explanation;Demonstration;Tactile cues;Handout    Comprehension Verbal cues required;Returned demonstration;Verbalized understanding                 OT Long Term Goals - 01/24/23 1849       OT LONG TERM GOAL #1   Title Patient to be independent in home program to initiate massage to increase flexion extension and right arm today to feed self.    Status Achieved      OT LONG TERM GOAL #2   Title Right elbow flexion increased for patient to be able to wash hair and make ponytail and put earrings on without increase symptoms.    Status Achieved      OT LONG TERM GOAL #3   Title Right elbow extension and 2-10 for patient to reach head as well as into pocket without increase symptoms.  It    Status Achieved      OT LONG TERM GOAL #4   Title Strength in right elbow improved patient to be able to push and pull heavy door as well as can really more than 5 pounds without increase symptoms    Status Achieved      OT LONG TERM GOAL #5   Title R grip strength increase for pt to carry more than 5 lbs for groceries and playing with grand daughter without symptoms    Status Achieved      Long Term Additional Goals   Additional Long Term Goals Yes      OT LONG TERM GOAL #6   Title Pt to  be ind in modifying and advancing her strenghthening  HEP symptoms free    Baseline Pt still increase her weight for carrying and doing elbow and shoulder - increase pain at elbow -as well as weight bearing on floor and arm shaking - end range elbow flexion, ext pain with weight at times    Time 9    Period Weeks    Status New    Target  Date 03/28/23                   Plan - 01/24/23 1315     Clinical Impression Statement Patient present at OT evaluation with a right Transcondylar humeral distal fracture with ORIF.  With ulnar collateral  ligament repair.  Surgery was done 09/20/2022. Pt is about 4 months out from surgery. Made great progress  in AROM and strength in R elbow . Extention 0 to -8 and flexion 145 degrees.  Patient reports she has increase use at home in ADL's and IADL"s - able to do most every thing except heavy activities- pt can carry and lift this date 8 lbs symptoms free.   Review this date with pt  to cont to sue 4 lbs for R UE in shoulder and elbow HEP until symptom free before whe increase weight. NOT to carry or lift 10 lbs yet.  Pt to cont with  her yoga routines but review with her to modify some of the weight bearing exercises to do on wall.  Pt cont to show increase confidence in using her R UE at home.   Reinforce with pt to slowly increase strengthening symptoms free -and it will take about year.   Pt decrease to biweekly to 3 wks.  Patient can benefit from skilled OT services to monitor progress in strengthening symptoms free.    OT Occupational Profile and History Problem Focused Assessment - Including review of records relating to presenting problem    Occupational performance deficits (Please refer to evaluation for details): ADL's;IADL's;Play;Social Participation;Leisure    Body Structure / Function / Physical Skills ADL;Edema;Decreased knowledge of precautions;Flexibility;IADL;Pain;Strength;Scar mobility;ROM;UE functional use    Rehab Potential Good    Clinical Decision Making Limited treatment options, no task modification necessary    Comorbidities Affecting Occupational Performance: None    Modification or Assistance to Complete Evaluation  No modification of tasks or assist necessary to complete eval    OT Frequency Biweekly    OT Duration 6 weeks    OT Treatment/Interventions Self-care/ADL training;Moist Heat;Paraffin;Contrast Bath;Therapeutic exercise;Manual Therapy;Passive range of motion;Scar mobilization;Splinting;Patient/family education    Consulted and Agree with Plan of Care Patient              Patient will benefit from skilled therapeutic intervention in order to improve the following deficits and impairments:   Body Structure / Function / Physical Skills: ADL, Edema, Decreased knowledge of precautions, Flexibility, IADL, Pain, Strength, Scar mobility, ROM, UE functional use       Visit Diagnosis: Pain in right elbow  Muscle weakness (generalized)  Scar condition and fibrosis of skin  Stiffness of right elbow, not elsewhere classified    Problem List Patient Active Problem List   Diagnosis Date Noted   CAD (coronary artery disease) 03/08/2022   Prediabetes 01/27/2021   Palpitations 01/27/2021   Left axillary pain 12/21/2019   Osteoporosis 12/21/2019   Fatigue 12/21/2019   Familial hyperlipidemia 11/10/2019   Positive ANA (antinuclear antibody) 11/10/2019   Anxiety 11/10/2019   Murmur 11/10/2019    Oletta Cohn, OTR/L,CLT 01/24/2023, 6:55 PM  [facility] [address] [place], [address] Phone: [phone]   Fax:  [phone]  Name: Cynthia Collins MRN: [id number] Date of Birth: April 02, 1946

## 2023-02-14 ENCOUNTER — Encounter: Payer: Medicare Other | Admitting: Occupational Therapy

## 2023-02-14 ENCOUNTER — Ambulatory Visit: Payer: Medicare Other | Admitting: Occupational Therapy

## 2023-02-14 DIAGNOSIS — M25521 Pain in right elbow: Secondary | ICD-10-CM

## 2023-02-14 DIAGNOSIS — M6281 Muscle weakness (generalized): Secondary | ICD-10-CM

## 2023-02-14 DIAGNOSIS — L905 Scar conditions and fibrosis of skin: Secondary | ICD-10-CM

## 2023-02-14 DIAGNOSIS — M25621 Stiffness of right elbow, not elsewhere classified: Secondary | ICD-10-CM

## 2023-02-14 NOTE — Therapy (Signed)
[facility] [address] [place], [address] Phone: [phone]   Fax:  [phone]  Occupational Therapy Treatment/discharge  Patient Details  Name: Cynthia Collins MRN: [id number] Date of Birth: Apr 20, 1946 Referring Provider (OT): DR Carola Frost   Encounter Date: 02/14/2023   OT End of Session - 02/14/23 1915     Visit Number 24    Number of Visits 24    Date for OT Re-Evaluation 02/14/23    OT Start Time 1318    OT Stop Time 1400    OT Time Calculation (min) 42 min    Activity Tolerance Patient tolerated treatment well    Behavior During Therapy [facility] for tasks assessed/performed             Past Medical History:  Diagnosis Date   Allergy    Anxiety    Asthma    Complication of anesthesia    slow to wake up   Heart murmur    Hyperlipidemia    Paroxysmal SVT (supraventricular tachycardia)    Pre-diabetes    Prediabetes    Squamous cell carcinoma of skin 08/03/2021   Mid chest - [facility] ED&C 09/07/21    Past Surgical History:  Procedure Laterality Date   APPENDECTOMY  08/20/1968   CESAREAN SECTION  08/20/1984   OOPHORECTOMY Right 08/20/1968   ORIF HUMERUS FRACTURE Right 09/20/2022   Procedure: OPEN REDUCTION INTERNAL FIXATION (ORIF) DISTAL HUMERUS FRACTURE;  Surgeon: Myrene Galas, MD;  Location: [facility] OR;  Service: Orthopedics;  Laterality: Right;   SKIN CANCER EXCISION      There were no vitals filed for this visit.   Subjective Assessment - 02/14/23 1911     Subjective  I am doing about 4- 5 pounds with my arms -elbow and shoulders-   Here and there I carry 8 pound in the house.  I do the weights and my yoga the same day.  Feels like a lot and then I stretch my elbow and the evening.    Pertinent History Pt fell on 09/01/22 - seen at ED - refer to Ortho - diagnosis of RIGHT HUMERUS TRANSCONDYALR FRACTURE      POST-OPERATIVE DIAGNOSIS:    1. RIGHT HUMERUS TRANSCONDYALR FRACTURE   2. RIGHT RADIAL HEAD  FRACTURE     PROCEDURE:  Procedure(s):  1. OPEN REDUCTION INTERNAL FIXATION (ORIF) DISTAL HUMERUS FRACTURE (Right)  2. OPEN TREATMENT OF RADIAL HEAD FRACTURE  3. REPAIR OF LATERAL ULNAR COLLATERAL LIGAMENT - Surgery on 09/20/22 and refer to OT to initiate ROM    Patient Stated Goals I want my motion and strenght back so I can stand on my hands and head in yoga again, lift weigths    Currently in Pain? No/denies                [facility] OT Assessment - 02/14/23 0001       AROM   Right Elbow Flexion 145    Right Elbow Extension -5      Strength   Right Hand Grip (lbs) 35    Right Hand Lateral Pinch 11 lbs    Right Hand 3 Point Pinch 10 lbs    Left Hand Grip (lbs) 39    Left Hand Lateral Pinch 10 lbs    Left Hand 3 Point Pinch 11 lbs             Patient arrived today with reports of increased functional use of right dominant hand at home and in  her exercises routines and yoga.    Patient reports using a 4 -5 pound weight  with her shoulder and  elbow exercises.. Using her hand in household including vacuuming.  But not doing things like heavy laundry basket or salt for her water or fitting sheet. Pt able to carry 15 pounds with bilateral hands symptom-free End range to do isometric for elbow flexion Remind patient to keep her home exercises in the future for longevity in a pain-free range-increasing weight slowly and systematically symptom-free. Patient also to alternate her yoga and strengthening sessions and do not do it at the same date.     Patient able to do a lot of her yoga poses but doing forearm weightbearing as well as on her knees and not feet.     Patient felt comfortable to be discharged.                   OT Education - 02/14/23 1915     Education Details progress and changes in HEP    Person(s) Educated Patient    Methods Explanation;Demonstration;Tactile cues;Handout    Comprehension Verbal cues required;Returned demonstration;Verbalized  understanding                 OT Long Term Goals - 02/14/23 1924       OT LONG TERM GOAL #1   Title Patient to be independent in home program to initiate massage to increase flexion extension and right arm today to feed self.    Status Achieved      OT LONG TERM GOAL #2   Title Right elbow flexion increased for patient to be able to wash hair and make ponytail and put earrings on without increase symptoms.    Status Achieved      OT LONG TERM GOAL #3   Title Right elbow extension and 2-10 for patient to reach head as well as into pocket without increase symptoms.  It    Status Achieved      OT LONG TERM GOAL #4   Title Strength in right elbow improved patient to be able to push and pull heavy door as well as can really more than 5 pounds without increase symptoms    Status Achieved      OT LONG TERM GOAL #5   Title R grip strength increase for pt to carry more than 5 lbs for groceries and playing with grand daughter without symptoms    Status Achieved                   Plan - 02/14/23 1916     Clinical Impression Statement Patient present at OT evaluation with a right Transcondylar humeral distal fracture with ORIF.  With ulnar collateral ligament repair.  Surgery was done 09/20/2022. Pt close to 5 months out from surgery. Made great progress from [facility]. Flexion 145 and extention -5 but can get to WNL with stretches.  Patient reports she has increase use at home in ADL's and IADL"s - able to do most every thing except  carry and pick up more than 10 lbs - but bilateral can pick up and carry 15 lbs. reviewed with patient this date to alter an 8 her workouts for strength and to 3 times a week keeping it at a comfortable weight symptom-free.  And doing her yoga the other days.  Also keeping that pain-free.  Patient to advance slowly over the next 3 to next month her activity level but symptom-free.  Patient  verbalize and demo understanding can be discharged at this time.     OT Occupational Profile and History Problem Focused Assessment - Including review of records relating to presenting problem    Occupational performance deficits (Please refer to evaluation for details): ADL's;IADL's;Play;Social Participation;Leisure    Body Structure / Function / Physical Skills ADL;Edema;Decreased knowledge of precautions;Flexibility;IADL;Pain;Strength;Scar mobility;ROM;UE functional use    Rehab Potential Good    Clinical Decision Making Limited treatment options, no task modification necessary    Comorbidities Affecting Occupational Performance: None    Modification or Assistance to Complete Evaluation  No modification of tasks or assist necessary to complete eval    OT Treatment/Interventions Self-care/ADL training;Moist Heat;Paraffin;Contrast Bath;Therapeutic exercise;Manual Therapy;Passive range of motion;Scar mobilization;Splinting;Patient/family education    Consulted and Agree with Plan of Care Patient             Patient will benefit from skilled therapeutic intervention in order to improve the following deficits and impairments:   Body Structure / Function / Physical Skills: ADL, Edema, Decreased knowledge of precautions, Flexibility, IADL, Pain, Strength, Scar mobility, ROM, UE functional use       Visit Diagnosis: Pain in right elbow  Muscle weakness (generalized)  Scar condition and fibrosis of skin  Stiffness of right elbow, not elsewhere classified    Problem List Patient Active Problem List   Diagnosis Date Noted   CAD (coronary artery disease) 03/08/2022   Prediabetes 01/27/2021   Palpitations 01/27/2021   Left axillary pain 12/21/2019   Osteoporosis 12/21/2019   Fatigue 12/21/2019   Familial hyperlipidemia 11/10/2019   Positive ANA (antinuclear antibody) 11/10/2019   Anxiety 11/10/2019   Murmur 11/10/2019    Oletta Cohn, OTR/L,CLT 02/14/2023, 7:26 PM  [facility] [address], [place], [address] Phone: [phone]   Fax:  [phone]  Name: Cynthia Collins MRN: [id number] Date of Birth: 1946/06/04

## 2023-02-28 ENCOUNTER — Ambulatory Visit (INDEPENDENT_AMBULATORY_CARE_PROVIDER_SITE_OTHER): Payer: Medicare Other

## 2023-02-28 VITALS — Ht 59.5 in | Wt 116.0 lb

## 2023-02-28 DIAGNOSIS — Z0000 Encounter for general adult medical examination without abnormal findings: Secondary | ICD-10-CM

## 2023-02-28 NOTE — Progress Notes (Signed)
Subjective:   Cynthia Collins is a 77 y.o. female who presents for Medicare Annual (Subsequent) preventive examination.  Visit Complete: Virtual  I connected with  Denine Brotz on 02/28/23 by a audio enabled telemedicine application and verified that I am speaking with the correct person using two identifiers.  Patient Location: Home  Provider Location: Office/Clinic  I discussed the limitations of evaluation and management by telemedicine. The patient expressed understanding and agreed to proceed.   Review of Systems      Cardiac Risk Factors include: advanced age (>53men, >66 women)     Objective:    Today's Vitals   02/28/23 0919  Weight: 116 lb (52.6 kg)  Height: 4' 11.5" (1.511 m)   Body mass index is 23.04 kg/m.     02/28/2023    9:29 AM 09/20/2022    6:26 AM 09/01/2022   10:14 PM 01/26/2022   10:53 AM  Advanced Directives  Does Patient Have a Medical Advance Directive? Yes Yes No Yes  Type of Estate agent of [place];Living will Healthcare Power of [place];Living will  Healthcare Power of [place];Living will  Does patient want to make changes to medical advance directive?  No - Patient declined    Copy of Healthcare Power of Attorney in Chart? No - copy requested   No - copy requested    Current Medications (verified) Outpatient Encounter Medications as of 02/28/2023  Medication Sig   ALPRAZolam (XANAX) 0.25 MG tablet Take 1 tablet by mouth daily as needed for anxiety. Use sparingly. (Patient taking differently: Take 0.125-0.25 mg by mouth daily as needed for anxiety.)   Ascorbic Acid (VITAMIN C) 1000 MG tablet Take 1,000 mg by mouth daily.   Coenzyme Q10 (CO Q 10 PO) Take by mouth. Three times weekly.   ELDERBERRY PO Take 1 tablet by mouth daily as needed (cold symptoms).   GARLIC PO Take by mouth daily.   Hyaluronic Acid-Vitamin C (HYALURONIC ACID PO) Take 100 mg by mouth daily.   Menaquinone-7 (VITAMIN K2 PO) Take by mouth daily.    Misc Natural Products (LUTEIN 20 PO) Take 20 mg by mouth daily.   Multiple Vitamin (MULTIVITAMIN) tablet Take 1 tablet by mouth in the morning and at bedtime.   NATTOKINASE PO Take 1 capsule by mouth daily.   NON FORMULARY Take 1 capsule by mouth daily. Haw[employer]orn Supreme   NON FORMULARY Bone Up 1 capsule 3 times a week   NON FORMULARY BCQ 3 days a week.   NON FORMULARY Collagen Powder Takes 1 scoop daily.   Omega-3 Fatty Acids (FISH OIL PO) Take by mouth. 4 times a week.   RESVERATROL PO Take 1 capsule by mouth 3 (three) times a week.   TURMERIC CURCUMIN PO Take 1 capsule by mouth daily.   VITAMIN D, CHOLECALCIFEROL, PO Take by mouth daily.   Whey Protein POWD Take 1 Scoop by mouth daily.   ibuprofen (ADVIL) 200 MG tablet Take 200 mg by mouth every 6 (six) hours as needed for headache or mild pain.   loperamide (IMODIUM A-D) 2 MG tablet Take 2 mg by mouth as needed for diarrhea or loose stools.   Misc Natural Products (WHITE WILLOW BARK PO) Take by mouth. Patient states she takes this for pain PRN (Patient not taking: Reported on 02/28/2023)   Zinc 50 MG CAPS Take 50 mg by mouth as needed (cold symptoms). (Patient not taking: Reported on 02/28/2023)   No facility-administered encounter medications on file as of 02/28/2023.  Allergies (verified) Red yeast rice extract, Statins, Zetia [ezetimibe], and Zoloft [sertraline]   History: Past Medical History:  Diagnosis Date   Allergy    Anxiety    Asthma    Complication of anesthesia    slow to wake up   Heart murmur    Hyperlipidemia    Paroxysmal SVT (supraventricular tachycardia)    Pre-diabetes    Prediabetes    Squamous cell carcinoma of skin 08/03/2021   Mid chest - [facility] ED&C 09/07/21   Past Surgical History:  Procedure Laterality Date   APPENDECTOMY  08/20/1968   CESAREAN SECTION  08/20/1984   OOPHORECTOMY Right 08/20/1968   ORIF HUMERUS FRACTURE Right 09/20/2022   Procedure: OPEN REDUCTION INTERNAL FIXATION (ORIF) DISTAL  HUMERUS FRACTURE;  Surgeon: Myrene Galas, MD;  Location: [facility] OR;  Service: Orthopedics;  Laterality: Right;   SKIN CANCER EXCISION     Family History  Problem Relation Age of Onset   Alcohol abuse Mother    Arthritis Mother    Heart disease Mother    Hypertension Mother    Mental illness Mother    Stroke Mother    Hypothyroidism Mother    Stroke Father    Heart disease Father    Hearing loss Father    Alcohol abuse Sister    Lung cancer Sister    Graves' disease Sister    Heart disease Maternal Grandmother    Diabetes Maternal Grandfather    Heart disease Paternal Grandmother    Breast cancer Neg Hx    Social History   Socioeconomic History   Marital status: Divorced    Spouse name: Not on file   Number of children: Not on file   Years of education: Not on file   Highest education level: Not on file  Occupational History   Not on file  Tobacco Use   Smoking status: Never   Smokeless tobacco: Never  Vaping Use   Vaping status: Never Used  Substance and Sexual Activity   Alcohol use: Not Currently    Alcohol/week: 1.0 standard drink of alcohol    Types: 1 Cans of beer per week    Comment: Once a month    Drug use: Never   Sexual activity: Not on file  Other Topics Concern   Not on file  Social History Narrative   Not on file   Social Determinants of Health   Financial Resource Strain: Low Risk  (02/28/2023)   Overall Financial Resource Strain (CARDIA)    Difficulty of Paying Living Expenses: Not hard at all  Food Insecurity: No Food Insecurity (02/28/2023)   Hunger Vital Sign    Worried About Running Out of Food in the Last Year: Never true    Ran Out of Food in the Last Year: Never true  Transportation Needs: No Transportation Needs (02/28/2023)   PRAPARE - Administrator, Civil Service (Medical): No    Lack of Transportation (Non-Medical): No  Physical Activity: Sufficiently Active (02/28/2023)   Exercise Vital Sign    Days of Exercise per  Week: 7 days    Minutes of Exercise per Session: 120 min  Stress: No Stress Concern Present (02/28/2023)   [employer] of Occupational Health - Occupational Stress Questionnaire    Feeling of Stress : Not at all  Social Connections: Socially Isolated (02/28/2023)   Social Connection and Isolation Panel [NHANES]    Frequency of Communication with Friends and Family: More than three times a week    Frequency  of Social Gatherings with Friends and Family: More than three times a week    Attends Religious Services: Never    Database administrator or Organizations: No    Attends Engineer, structural: Never    Marital Status: Divorced    Tobacco Counseling Counseling given: Not Answered   Clinical Intake:  Pre-visit preparation completed: Yes  Pain : No/denies pain     BMI - recorded: 23.04 Nutritional Status: BMI of 19-24  Normal Nutritional Risks: None Diabetes: No  How often do you need to have someone help you when you read instructions, pamphlets, or other written materials from your doctor or pharmacy?: 1 - Never  Interpreter Needed?: No  Information entered by :: C.Cesar Rogerson LPN   Activities of Daily Living    02/28/2023    9:31 AM 02/25/2023   10:19 AM  In your present state of health, do you have any difficulty performing the following activities:  Hearing? 0 0  Vision? 0 0  Difficulty concentrating or making decisions? 1 0  Comment occasionally forgets names   Walking or climbing stairs? 0 0  Dressing or bathing? 0 0  Doing errands, shopping? 0 0  Preparing Food and eating ? N N  Using the Toilet? N N  In the past six months, have you accidently leaked urine? N N  Do you have problems with loss of bowel control? N N  Managing your Medications? N N  Managing your Finances? N N  Housekeeping or managing your Housekeeping? N N    Patient Care Team: Doreene Nest, NP as PCP - General (Internal Medicine) Iran Ouch, MD as PCP -  Cardiology (Cardiology)  Indicate any recent Medical Services you may have received from other than [facility] providers in the past year (date may be approximate).     Assessment:   This is a routine wellness examination for [place].  Hearing/Vision screen Hearing Screening - Comments:: Has over the counter hearing aid  Vision Screening - Comments:: Glasses - [facility] - UTD on eye exams  Dietary issues and exercise activities discussed:     Goals Addressed             This Visit's Progress    Patient Stated       Continue exercise daily.       Depression Screen    02/28/2023    9:28 AM 01/26/2022   10:54 AM  PHQ 2/9 Scores  PHQ - 2 Score 0 0    Fall Risk    02/28/2023    9:31 AM 02/25/2023   10:19 AM 01/26/2022   10:54 AM 01/24/2022   12:34 PM 01/27/2021   12:02 PM  Fall Risk   Falls in the past year? 1 1 0 0 0  Number falls in past yr: 0 0 0 0 0  Injury with Fall? 1 1 0 0   Comment tripped in garden and broke arm.      Risk for fall due to : No Fall Risks  Medication side effect    Follow up Falls prevention discussed;Falls evaluation completed;Education provided  Falls evaluation completed;Education provided;Falls prevention discussed      MEDICARE RISK AT HOME:  Medicare Risk at Home - 02/28/23 0932     Any stairs in or around the home? Yes    If so, are there any without handrails? No    Home free of loose throw rugs in walkways, pet beds, electrical cords, etc? Yes  Adequate lighting in your home to reduce risk of falls? Yes    Life alert? No    Use of a cane, walker or w/c? No    Grab bars in the bathroom? Yes    Shower chair or bench in shower? Yes    Elevated toilet seat or a handicapped toilet? No             TIMED UP AND GO:  Was the test performed?  No    Cognitive Function:        02/28/2023    9:33 AM 01/26/2022   10:56 AM  6CIT Screen  What Year? 0 points 0 points  What month? 0 points 0 points  What time? 0 points 0 points  Count  back from 20 0 points 0 points  Months in reverse 0 points 0 points  Repeat phrase 0 points 0 points  Total Score 0 points 0 points    Immunizations Immunization History  Administered Date(s) Administered   Influenza,inj,Quad PF,6+ Mos 06/02/2020   PFIZER(Purple Top)SARS-COV-2 Vaccination 11/30/2019, 12/30/2019   Tdap 08/20/2005, 05/19/2020    TDAP status: Up to date  Flu Vaccine status: Declined, Education has been provided regarding the importance of this vaccine but patient still declined. Advised may receive this vaccine at local pharmacy or Health Dept. Aware to provide a copy of the vaccination record if obtained from local pharmacy or Health Dept. Verbalized acceptance and understanding.  Pneumococcal vaccine status: Declined,  Education has been provided regarding the importance of this vaccine but patient still declined. Advised may receive this vaccine at local pharmacy or Health Dept. Aware to provide a copy of the vaccination record if obtained from local pharmacy or Health Dept. Verbalized acceptance and understanding.   Covid-19 vaccine status: Declined, Education has been provided regarding the importance of this vaccine but patient still declined. Advised may receive this vaccine at local pharmacy or Health Dept.or vaccine clinic. Aware to provide a copy of the vaccination record if obtained from local pharmacy or Health Dept. Verbalized acceptance and understanding.  Qualifies for Shingles Vaccine? Yes   Zostavax completed No   Shingrix Completed?: No.    Education has been provided regarding the importance of this vaccine. Patient has been advised to call insurance company to determine out of pocket expense if they have not yet received this vaccine. Advised may also receive vaccine at local pharmacy or Health Dept. Verbalized acceptance and understanding.  Screening Tests Health Maintenance  Topic Date Due   Hepatitis C Screening  Never done   Zoster Vaccines-  Shingrix (1 of 2) Never done   Pneumonia Vaccine 69+ Years old (1 of 1 - PCV) Never done   COVID-19 Vaccine (3 - Pfizer risk series) 01/27/2020   Medicare Annual Wellness (AWV)  01/27/2023   INFLUENZA VACCINE  03/21/2023   DTaP/Tdap/Td (3 - Td or Tdap) 05/19/2030   DEXA SCAN  Completed   HPV VACCINES  Aged Out   Colonoscopy  Discontinued    Health Maintenance  Health Maintenance Due  Topic Date Due   Hepatitis C Screening  Never done   Zoster Vaccines- Shingrix (1 of 2) Never done   Pneumonia Vaccine 63+ Years old (1 of 1 - PCV) Never done   COVID-19 Vaccine (3 - Pfizer risk series) 01/27/2020   Medicare Annual Wellness (AWV)  01/27/2023    Colorectal cancer screening: No longer required.   Mammogram status: No longer required due to DECLINED BY PT.  Bone Density status:  Completed 11/25/19. Results reflect: Bone density results: OSTEOPOROSIS. Repeat every 0 years. Repeat declined by pt.  Lung Cancer Screening: (Low Dose CT Chest recommended if Age 64-80 years, 20 pack-year currently smoking OR have quit w/in 15years.) does not qualify.   Lung Cancer Screening Referral: n/a  Additional Screening:  Hepatitis C Screening: does qualify; Completed Due at next OV  Vision Screening: Recommended annual ophthalmology exams for early detection of glaucoma and other disorders of the eye. Is the patient up to date with their annual eye exam?  Yes  Who is the provider or what is the name of the office in which the patient attends annual eye exams? [facility] If pt is not established with a provider, would they like to be referred to a provider to establish care? Yes .   Dental Screening: Recommended annual dental exams for proper oral hygiene    Community Resource Referral / Chronic Care Management: CRR required this visit?  No   CCM required this visit?  No     Plan:     I have personally reviewed and noted the following in the patient's chart:   Medical and social  history Use of alcohol, tobacco or illicit drugs  Current medications and supplements including opioid prescriptions. Patient is not currently taking opioid prescriptions. Functional ability and status Nutritional status Physical activity Advanced directives List of other physicians Hospitalizations, surgeries, and ER visits in previous 12 months Vitals Screenings to include cognitive, depression, and falls Referrals and appointments  In addition, I have reviewed and discussed with patient certain preventive protocols, quality metrics, and best practice recommendations. A written personalized care plan for preventive services as well as general preventive health recommendations were provided to patient.     Maryan Puls, LPN   1/61/0960   After Visit Summary: (MyChart) Due to this being a telephonic visit, the after visit summary with patients personalized plan was offered to patient via MyChart   Nurse Notes: Pt declined annual mammogram. Vaccinations: declines all Influenza vaccine: recommend every Fall Pneumococcal vaccine: recommend once per lifetime Prevnar-20 Tdap vaccine: recommend every 10 years Shingles vaccine: recommend Shingrix which is 2 doses 2-6 months apart and over 90% effective     Covid-19: recommend 2 doses one month apart with a booster 6 months later

## 2023-02-28 NOTE — Patient Instructions (Signed)
Cynthia Collins , Thank you for taking time to come for your Medicare Wellness Visit. I appreciate your ongoing commitment to your health goals. Please review the following plan we discussed and let me know if I can assist you in the future.   These are the goals we discussed:  Goals      Patient Stated     01/26/2022, get over anxiety     Patient Stated     Continue exercise daily.        This is a list of the screening recommended for you and due dates:  Health Maintenance  Topic Date Due   Hepatitis C Screening  Never done   Zoster (Shingles) Vaccine (1 of 2) Never done   Pneumonia Vaccine (1 of 1 - PCV) Never done   COVID-19 Vaccine (3 - Pfizer risk series) 01/27/2020   Medicare Annual Wellness Visit  01/27/2023   Flu Shot  03/21/2023   DTaP/Tdap/Td vaccine (3 - Td or Tdap) 05/19/2030   DEXA scan (bone density measurement)  Completed   HPV Vaccine  Aged Out   Colon Cancer Screening  Discontinued    Advanced directives: Please bring a copy of your health care power of attorney and living will to the office to be added to your chart at your convenience.   Conditions/risks identified: Aim for 30 minutes of exercise or brisk walking, 6-8 glasses of water, and 5 servings of fruits and vegetables each day.   Next appointment: Follow up in one year for your annual wellness visit 03/03/24 @ 9:45 am telephone call   Preventive Care 65 Years and Older, Female Preventive care refers to lifestyle choices and visits with your health care provider that can promote health and wellness. What does preventive care include? A yearly physical exam. This is also called an annual well check. Dental exams once or twice a year. Routine eye exams. Ask your health care provider how often you should have your eyes checked. Personal lifestyle choices, including: Daily care of your teeth and gums. Regular physical activity. Eating a healthy diet. Avoiding tobacco and drug use. Limiting alcohol  use. Practicing safe sex. Taking low-dose aspirin every day. Taking vitamin and mineral supplements as recommended by your health care provider. What happens during an annual well check? The services and screenings done by your health care provider during your annual well check will depend on your age, overall health, lifestyle risk factors, and family history of disease. Counseling  Your health care provider may ask you questions about your: Alcohol use. Tobacco use. Drug use. Emotional well-being. Home and relationship well-being. Sexual activity. Eating habits. History of falls. Memory and ability to understand (cognition). Work and work Astronomer. Reproductive health. Screening  You may have the following tests or measurements: Height, weight, and BMI. Blood pressure. Lipid and cholesterol levels. These may be checked every 5 years, or more frequently if you are over 13 years old. Skin check. Lung cancer screening. You may have this screening every year starting at age 68 if you have a 30-pack-year history of smoking and currently smoke or have quit within the past 15 years. Fecal occult blood test (FOBT) of the stool. You may have this test every year starting at age 73. Flexible sigmoidoscopy or colonoscopy. You may have a sigmoidoscopy every 5 years or a colonoscopy every 10 years starting at age 52. Hepatitis C blood test. Hepatitis B blood test. Sexually transmitted disease (STD) testing. Diabetes screening. This is done by checking your  blood sugar (glucose) after you have not eaten for a while (fasting). You may have this done every 1-3 years. Bone density scan. This is done to screen for osteoporosis. You may have this done starting at age 60. Mammogram. This may be done every 1-2 years. Talk to your health care provider about how often you should have regular mammograms. Talk with your health care provider about your test results, treatment options, and if necessary,  the need for more tests. Vaccines  Your health care provider may recommend certain vaccines, such as: Influenza vaccine. This is recommended every year. Tetanus, diphtheria, and acellular pertussis (Tdap, Td) vaccine. You may need a Td booster every 10 years. Zoster vaccine. You may need this after age 78. Pneumococcal 13-valent conjugate (PCV13) vaccine. One dose is recommended after age 63. Pneumococcal polysaccharide (PPSV23) vaccine. One dose is recommended after age 45. Talk to your health care provider about which screenings and vaccines you need and how often you need them. This information is not intended to replace advice given to you by your health care provider. Make sure you discuss any questions you have with your health care provider. Document Released: 09/02/2015 Document Revised: 04/25/2016 Document Reviewed: 06/07/2015 Elsevier Interactive Patient Education  2017 [employer].  Fall Prevention in the Home Falls can cause injuries. They can happen to people of all ages. There are many things you can do to make your home safe and to help prevent falls. What can I do on the outside of my home? Regularly fix the edges of walkways and driveways and fix any cracks. Remove anything that might make you trip as you walk through a door, such as a raised step or threshold. Trim any bushes or trees on the path to your home. Use bright outdoor lighting. Clear any walking paths of anything that might make someone trip, such as rocks or tools. Regularly check to see if handrails are loose or broken. Make sure that both sides of any steps have handrails. Any raised decks and porches should have guardrails on the edges. Have any leaves, snow, or ice cleared regularly. Use sand or salt on walking paths during winter. Clean up any spills in your garage right away. This includes oil or grease spills. What can I do in the bathroom? Use night lights. Install grab bars by the toilet and in the  tub and shower. Do not use towel bars as grab bars. Use non-skid mats or decals in the tub or shower. If you need to sit down in the shower, use a plastic, non-slip stool. Keep the floor dry. Clean up any water that spills on the floor as soon as it happens. Remove soap buildup in the tub or shower regularly. Attach bath mats securely with double-sided non-slip rug tape. Do not have throw rugs and other things on the floor that can make you trip. What can I do in the bedroom? Use night lights. Make sure that you have a light by your bed that is easy to reach. Do not use any sheets or blankets that are too big for your bed. They should not hang down onto the floor. Have a firm chair that has side arms. You can use this for support while you get dressed. Do not have throw rugs and other things on the floor that can make you trip. What can I do in the kitchen? Clean up any spills right away. Avoid walking on wet floors. Keep items that you use a lot in  easy-to-reach places. If you need to reach something above you, use a strong step stool that has a grab bar. Keep electrical cords out of the way. Do not use floor polish or wax that makes floors slippery. If you must use wax, use non-skid floor wax. Do not have throw rugs and other things on the floor that can make you trip. What can I do with my stairs? Do not leave any items on the stairs. Make sure that there are handrails on both sides of the stairs and use them. Fix handrails that are broken or loose. Make sure that handrails are as long as the stairways. Check any carpeting to make sure that it is firmly attached to the stairs. Fix any carpet that is loose or worn. Avoid having throw rugs at the top or bottom of the stairs. If you do have throw rugs, attach them to the floor with carpet tape. Make sure that you have a light switch at the top of the stairs and the bottom of the stairs. If you do not have them, ask someone to add them for  you. What else can I do to help prevent falls? Wear shoes that: Do not have high heels. Have rubber bottoms. Are comfortable and fit you well. Are closed at the toe. Do not wear sandals. If you use a stepladder: Make sure that it is fully opened. Do not climb a closed stepladder. Make sure that both sides of the stepladder are locked into place. Ask someone to hold it for you, if possible. Clearly mark and make sure that you can see: Any grab bars or handrails. First and last steps. Where the edge of each step is. Use tools that help you move around (mobility aids) if they are needed. These include: Canes. Walkers. Scooters. Crutches. Turn on the lights when you go into a dark area. Replace any light bulbs as soon as they burn out. Set up your furniture so you have a clear path. Avoid moving your furniture around. If any of your floors are uneven, fix them. If there are any pets around you, be aware of where they are. Review your medicines with your doctor. Some medicines can make you feel dizzy. This can increase your chance of falling. Ask your doctor what other things that you can do to help prevent falls. This information is not intended to replace advice given to you by your health care provider. Make sure you discuss any questions you have with your health care provider. Document Released: 06/02/2009 Document Revised: 01/12/2016 Document Reviewed: 09/10/2014 Elsevier Interactive Patient Education  2017 [employer].

## 2023-03-12 ENCOUNTER — Ambulatory Visit: Payer: Medicare Other | Admitting: Primary Care

## 2023-03-14 ENCOUNTER — Ambulatory Visit (INDEPENDENT_AMBULATORY_CARE_PROVIDER_SITE_OTHER): Payer: Medicare Other | Admitting: Primary Care

## 2023-03-14 ENCOUNTER — Encounter: Payer: Self-pay | Admitting: Primary Care

## 2023-03-14 VITALS — BP 170/84 | HR 82 | Temp 97.3°F | Ht 60.0 in | Wt 117.0 lb

## 2023-03-14 DIAGNOSIS — E7849 Other hyperlipidemia: Secondary | ICD-10-CM | POA: Diagnosis not present

## 2023-03-14 DIAGNOSIS — F419 Anxiety disorder, unspecified: Secondary | ICD-10-CM

## 2023-03-14 DIAGNOSIS — I2510 Atherosclerotic heart disease of native coronary artery without angina pectoris: Secondary | ICD-10-CM | POA: Diagnosis not present

## 2023-03-14 DIAGNOSIS — M810 Age-related osteoporosis without current pathological fracture: Secondary | ICD-10-CM | POA: Diagnosis not present

## 2023-03-14 DIAGNOSIS — R7303 Prediabetes: Secondary | ICD-10-CM

## 2023-03-14 LAB — COMPREHENSIVE METABOLIC PANEL
ALT: 15 U/L (ref 0–35)
AST: 19 U/L (ref 0–37)
Albumin: 4.3 g/dL (ref 3.5–5.2)
Alkaline Phosphatase: 117 U/L (ref 39–117)
BUN: 21 mg/dL (ref 6–23)
CO2: 29 mEq/L (ref 19–32)
Calcium: 9.8 mg/dL (ref 8.4–10.5)
Chloride: 102 mEq/L (ref 96–112)
Creatinine, Ser: 0.8 mg/dL (ref 0.40–1.20)
GFR: 71.26 mL/min (ref >60.00)
Glucose, Bld: 106 mg/dL — ABNORMAL HIGH (ref 70–99)
Potassium: 4.3 mEq/L (ref 3.5–5.1)
Sodium: 140 mEq/L (ref 135–145)
Total Bilirubin: 0.6 mg/dL (ref 0.2–1.2)
Total Protein: 6.8 g/dL (ref 6.0–8.3)

## 2023-03-14 LAB — LIPID PANEL
Cholesterol: 279 mg/dL — ABNORMAL HIGH (ref 0–200)
HDL: 73.4 mg/dL (ref >39.00)
LDL Cholesterol: 188 mg/dL — ABNORMAL HIGH (ref 0–99)
NonHDL: 205.55
Total CHOL/HDL Ratio: 4
Triglycerides: 88 mg/dL (ref 0.0–149.0)
VLDL: 17.6 mg/dL (ref 0.0–40.0)

## 2023-03-14 LAB — HEMOGLOBIN A1C: Hgb A1c MFr Bld: 5.8 % (ref 4.6–6.5)

## 2023-03-14 LAB — C-REACTIVE PROTEIN: CRP: <1 mg/dL (ref 0.5–20.0)

## 2023-03-14 LAB — MAGNESIUM: Magnesium: 2 mg/dL (ref 1.5–2.5)

## 2023-03-14 NOTE — Assessment & Plan Note (Signed)
Repeat A1C pending.  Continue healthy diet and regular exercise.

## 2023-03-14 NOTE — Assessment & Plan Note (Signed)
Declines repeat bone density scan.  Continue calcium and vitamin D. Continue daily weight bearing exercise.

## 2023-03-14 NOTE — Patient Instructions (Signed)
Stop by the lab prior to leaving today. I will notify you of your results once received.   Think about the bone density scan.  It was a pleasure to see you today!

## 2023-03-14 NOTE — Assessment & Plan Note (Signed)
Chronic and continued.  No concerns today.  Discouraged regular use of alprazolam.

## 2023-03-14 NOTE — Assessment & Plan Note (Signed)
Asymptomatic.  Reviewed office notes from September 2023. Continue regular exercise and healthy diet.

## 2023-03-14 NOTE — Assessment & Plan Note (Addendum)
Declines all prescription treatment. Repeat lipid panel pending.  Continue regular exercise and healthy diet.

## 2023-03-14 NOTE — Progress Notes (Signed)
Subjective:    Patient ID: Cynthia Collins, female    DOB: Jan 04, 1946, 77 y.o.   MRN: [id number]  HPI  Cynthia Collins is a very pleasant 77 y.o. female with a history of CAD, osteoporosis, hyperlipidemia, anxiety, prediabetes who presents today for follow-up of chronic conditions.  She would like to defer her mammogram until next year. She declines all vaccines.  1) CAD/Hyperlipidemia: Currently not on prescription treatment.  She takes many supplements OTC. She has myalgias to statin therapy and Zetia.  Follows with cardiology, last office visit was in September 2023, no changes were made as she declines all prescription medications.  She has a history of white coat hypertension. She monitors her BP at home which runs 110's/60's-70's.   She has no concerns today.   2) Osteoporosis: Last bone density scan completed was in 2021 which revealed a T-score of -3.4.  She was noted to have osteopenia to right femur neck and osteoporosis to the left forearm.  She is not interested in a repeat bone density scan as she doesn't see the point. She is compliant to calcium and vitamin D daily. She is walking 3 miles daily and is lifting weights several days weekly.   3) Prediabetes: Chronic.  A1c over the last 3 years has ranged 5.7-6.1. She is exercising regularly everyday.   BP Readings from Last 3 Encounters:  03/14/23 (!) 170/84  11/21/22 (!) 161/71  09/20/22 (!) 111/54     Review of Systems  Respiratory:  Negative for shortness of breath.   Cardiovascular:  Negative for chest pain.  Gastrointestinal:  Negative for constipation and diarrhea.  Neurological:  Negative for dizziness and headaches.  Psychiatric/Behavioral:  The patient is nervous/anxious.          Past Medical History:  Diagnosis Date   Allergy    Anxiety    Complication of anesthesia    slow to wake up   Heart murmur    Hyperlipidemia    Paroxysmal SVT (supraventricular tachycardia)    Pre-diabetes     Prediabetes    Squamous cell carcinoma of skin 08/03/2021   Mid chest - [facility] 09/07/21    Social History   Socioeconomic History   Marital status: Divorced    Spouse name: Not on file   Number of children: Not on file   Years of education: Not on file   Highest education level: Not on file  Occupational History   Not on file  Tobacco Use   Smoking status: Never   Smokeless tobacco: Never  Vaping Use   Vaping status: Never Used  Substance and Sexual Activity   Alcohol use: Not Currently    Alcohol/week: 1.0 standard drink of alcohol    Types: 1 Cans of beer per week    Comment: Once a month    Drug use: Never   Sexual activity: Not on file  Other Topics Concern   Not on file  Social History Narrative   Not on file   Social Determinants of Health   Financial Resource Strain: Low Risk  (02/28/2023)   Overall Financial Resource Strain (CARDIA)    Difficulty of Paying Living Expenses: Not hard at all  Food Insecurity: No Food Insecurity (02/28/2023)   Hunger Vital Sign    Worried About Running Out of Food in the Last Year: Never true    Ran Out of Food in the Last Year: Never true  Transportation Needs: No Transportation Needs (02/28/2023)   PRAPARE - Transportation  Lack of Transportation (Medical): No    Lack of Transportation (Non-Medical): No  Physical Activity: Sufficiently Active (02/28/2023)   Exercise Vital Sign    Days of Exercise per Week: 7 days    Minutes of Exercise per Session: 120 min  Stress: No Stress Concern Present (02/28/2023)   [employer] of Occupational Health - Occupational Stress Questionnaire    Feeling of Stress : Not at all  Social Connections: Socially Isolated (02/28/2023)   Social Connection and Isolation Panel [NHANES]    Frequency of Communication with Friends and Family: More than three times a week    Frequency of Social Gatherings with Friends and Family: More than three times a week    Attends Religious Services: Never     Database administrator or Organizations: No    Attends Banker Meetings: Never    Marital Status: Divorced  Catering manager Violence: Not At Risk (02/28/2023)   Humiliation, Afraid, Rape, and Kick questionnaire    Fear of Current or Ex-Partner: No    Emotionally Abused: No    Physically Abused: No    Sexually Abused: No    Past Surgical History:  Procedure Laterality Date   APPENDECTOMY  08/20/1968   CESAREAN SECTION  08/20/1984   OOPHORECTOMY Right 08/20/1968   ORIF HUMERUS FRACTURE Right 09/20/2022   Procedure: OPEN REDUCTION INTERNAL FIXATION (ORIF) DISTAL HUMERUS FRACTURE;  Surgeon: Myrene Galas, MD;  Location: [facility] OR;  Service: Orthopedics;  Laterality: Right;   SKIN CANCER EXCISION      Family History  Problem Relation Age of Onset   Alcohol abuse Mother    Arthritis Mother    Heart disease Mother    Hypertension Mother    Mental illness Mother    Stroke Mother    Hypothyroidism Mother    Stroke Father    Heart disease Father    Hearing loss Father    Alcohol abuse Sister    Lung cancer Sister    Luiz Blare' disease Sister    Heart disease Maternal Grandmother    Diabetes Maternal Grandfather    Heart disease Paternal Grandmother    Breast cancer Neg Hx     Allergies  Allergen Reactions   Red Yeast Rice Extract     Muscle pain   Statins     Muscle pain  Joint pain     Zetia [Ezetimibe]     Nausea/stomach pain/gagging   Zoloft [Sertraline] Anxiety    Current Outpatient Medications on File Prior to Visit  Medication Sig Dispense Refill   ALPRAZolam (XANAX) 0.25 MG tablet Take 1 tablet by mouth daily as needed for anxiety. Use sparingly. 10 tablet 0   Ascorbic Acid (VITAMIN C) 1000 MG tablet Take 1,000 mg by mouth daily.     Coenzyme Q10 (CO Q 10 PO) Take by mouth. Three times weekly.     ELDERBERRY PO Take 1 tablet by mouth daily as needed (cold symptoms).     GARLIC PO Take by mouth daily.     Hyaluronic Acid-Vitamin C (HYALURONIC ACID PO)  Take 100 mg by mouth daily.     Menaquinone-7 (VITAMIN K2 PO) Take by mouth daily.     Misc Natural Products (LUTEIN 20 PO) Take 20 mg by mouth daily.     Misc Natural Products (WHITE WILLOW BARK PO) Take by mouth. Patient states she takes this for pain PRN     Multiple Vitamin (MULTIVITAMIN) tablet Take 1 tablet by mouth in the morning and at  bedtime.     NATTOKINASE PO Take 1 capsule by mouth daily.     NON FORMULARY Take 1 capsule by mouth daily. [employer]     NON FORMULARY Bone Up 1 capsule 3 times a week     NON FORMULARY BCQ 3 days a week.     NON FORMULARY Collagen Powder Takes 1 scoop daily.     Omega-3 Fatty Acids (FISH OIL PO) Take by mouth. 4 times a week.     RESVERATROL PO Take 1 capsule by mouth 3 (three) times a week.     TURMERIC CURCUMIN PO Take 1 capsule by mouth daily.     VITAMIN D, CHOLECALCIFEROL, PO Take by mouth daily.     Whey Protein POWD Take 1 Scoop by mouth daily.     Zinc 50 MG CAPS Take 50 mg by mouth as needed (cold symptoms).     ibuprofen (ADVIL) 200 MG tablet Take 200 mg by mouth every 6 (six) hours as needed for headache or mild pain.     loperamide (IMODIUM A-D) 2 MG tablet Take 2 mg by mouth as needed for diarrhea or loose stools.     No current facility-administered medications on file prior to visit.    BP (!) 170/84   Pulse 82   Temp (!) 97.3 F (36.3 C) (Temporal)   Ht 5' (1.524 m)   Wt 117 lb (53.1 kg)   SpO2 96%   BMI 22.85 kg/m  Objective:   Physical Exam Cardiovascular:     Rate and Rhythm: Normal rate and regular rhythm.  Pulmonary:     Effort: Pulmonary effort is normal.     Breath sounds: Normal breath sounds.  Abdominal:     General: Bowel sounds are normal.     Palpations: Abdomen is soft.     Tenderness: There is no abdominal tenderness.  Musculoskeletal:     Cervical back: Neck supple.  Skin:    General: Skin is warm and dry.  Neurological:     Mental Status: She is alert.  Psychiatric:        Mood and  Affect: Mood normal.           Assessment & Plan:  Coronary artery disease involving native heart without angina pectoris, unspecified vessel or lesion type Assessment & Plan: Asymptomatic.  Reviewed office notes from September 2023. Continue regular exercise and healthy diet.  Orders: -     Lipid panel -     Comprehensive metabolic panel -     Magnesium  Osteoporosis without current pathological fracture, unspecified osteoporosis type Assessment & Plan: Declines repeat bone density scan.  Continue calcium and vitamin D. Continue daily weight bearing exercise.     Familial hyperlipidemia Assessment & Plan: Declines all prescription treatment. Repeat lipid panel pending.  Continue regular exercise and healthy diet.   Orders: -     Lipid panel -     C-reactive protein -     Magnesium  Prediabetes Assessment & Plan: Repeat A1C pending.  Continue healthy diet and regular exercise.   Orders: -     Hemoglobin A1c -     C-reactive protein -     Magnesium  Anxiety Assessment & Plan: Chronic and continued.  No concerns today.  Discouraged regular use of alprazolam.          Doreene Nest, NP

## 2023-03-21 DIAGNOSIS — M810 Age-related osteoporosis without current pathological fracture: Secondary | ICD-10-CM

## 2023-03-21 NOTE — Telephone Encounter (Signed)
Do not see were vit D was checked at last labs

## 2023-05-30 ENCOUNTER — Encounter: Payer: Self-pay | Admitting: Dermatology

## 2023-05-30 ENCOUNTER — Ambulatory Visit: Payer: Medicare Other | Admitting: Dermatology

## 2023-05-30 DIAGNOSIS — D1801 Hemangioma of skin and subcutaneous tissue: Secondary | ICD-10-CM | POA: Diagnosis not present

## 2023-05-30 DIAGNOSIS — Z1283 Encounter for screening for malignant neoplasm of skin: Secondary | ICD-10-CM | POA: Diagnosis not present

## 2023-05-30 DIAGNOSIS — L821 Other seborrheic keratosis: Secondary | ICD-10-CM

## 2023-05-30 DIAGNOSIS — Z85828 Personal history of other malignant neoplasm of skin: Secondary | ICD-10-CM

## 2023-05-30 DIAGNOSIS — L578 Other skin changes due to chronic exposure to nonionizing radiation: Secondary | ICD-10-CM

## 2023-05-30 DIAGNOSIS — W908XXA Exposure to other nonionizing radiation, initial encounter: Secondary | ICD-10-CM

## 2023-05-30 DIAGNOSIS — R202 Paresthesia of skin: Secondary | ICD-10-CM

## 2023-05-30 DIAGNOSIS — D692 Other nonthrombocytopenic purpura: Secondary | ICD-10-CM

## 2023-05-30 DIAGNOSIS — L814 Other melanin hyperpigmentation: Secondary | ICD-10-CM | POA: Diagnosis not present

## 2023-05-30 DIAGNOSIS — D229 Melanocytic nevi, unspecified: Secondary | ICD-10-CM

## 2023-05-30 DIAGNOSIS — Z872 Personal history of diseases of the skin and subcutaneous tissue: Secondary | ICD-10-CM

## 2023-05-30 NOTE — Progress Notes (Signed)
Follow-Up Visit   Subjective  Cynthia Collins is a 77 y.o. female who presents for the following: Skin Cancer Screening and Full Body Skin Exam, hx of SCC, Aks, check spot L post lower leg, has had a while, no symptoms, check spot R cheek, hx of AK txted in past, may be recurrent, check spot R flank at bra line, Itching L post shoulder 6 wks, no treatment  The patient presents for Total-Body Skin Exam (TBSE) for skin cancer screening and mole check. The patient has spots, moles and lesions to be evaluated, some may be new or changing and the patient may have concern these could be cancer.    The following portions of the chart were reviewed this encounter and updated as appropriate: medications, allergies, medical history  Review of Systems:  No other skin or systemic complaints except as noted in HPI or Assessment and Plan.  Objective  Well appearing patient in no apparent distress; mood and affect are within normal limits.  A full examination was performed including scalp, head, eyes, ears, nose, lips, neck, chest, axillae, abdomen, back, buttocks, bilateral upper extremities, bilateral lower extremities, hands, feet, fingers, toes, fingernails, and toenails. All findings within normal limits unless otherwise noted below.   Relevant physical exam findings are noted in the Assessment and Plan.    Assessment & Plan   SKIN CANCER SCREENING PERFORMED TODAY.  ACTINIC DAMAGE - Chronic condition, secondary to cumulative UV/sun exposure - diffuse scaly erythematous macules with underlying dyspigmentation - Recommend daily broad spectrum sunscreen SPF 30+ to sun-exposed areas, reapply every 2 hours as needed.  - Staying in the shade or wearing long sleeves, sun glasses (UVA+UVB protection) and wide brim hats (4-inch brim around the entire circumference of the hat) are also recommended for sun protection.  - Call for new or changing lesions.  LENTIGINES, SEBORRHEIC KERATOSES, HEMANGIOMAS  - Benign normal skin lesions - Benign-appearing - Call for any changes - SKs L post lower leg, R flank  MELANOCYTIC NEVI - Tan-brown and/or pink-flesh-colored symmetric macules and papules - Benign appearing on exam today - Observation - Call clinic for new or changing moles - Recommend daily use of broad spectrum spf 30+ sunscreen to sun-exposed areas.   HISTORY OF SQUAMOUS CELL CARCINOMA OF THE SKIN - No evidence of recurrence today - No lymphadenopathy - Recommend regular full body skin exams - Recommend daily broad spectrum sunscreen SPF 30+ to sun-exposed areas, reapply every 2 hours as needed.  - Call if any new or changing lesions are noted between office visits - Mid chest   NOTALGIA PARESTHETICA L scapula Exam: Perispinal hyperpigmented patch Chronic condition without cure secondary to pinched nerve along spine causing itching or sensation changes in an area of skin. Chronic rubbing or scratching causes darkening of the skin.  OTC treatments which can help with itch include numbing creams like pramoxine or lidocaine which temporarily reduce itch or Capsaicin-containing creams which cause a burning sensation but which sometimes over time will reset the nerves to stop producing itch.  If you choose to use Capsaicin cream, it is recommended to use it 5 times daily for 1 week followed by 3 times daily for 3-6 weeks. You may have to continue using it long-term.  If not doing well with OTC options, could consider [employer] [facility] compounded prescription anti-itch cream with Amitriptyline 5% / Lidocaine 5% / Pramoxine 1% or Amitriptyline 5% / Gabapentin 10% / Lidocaine 5% Cream or other prescription cream or pill options.  Purpura - Chronic; persistent and recurrent.  Treatable, but not curable. - Violaceous macules and patches - Benign - Related to trauma, age, sun damage and/or use of blood thinners, chronic use of topical and/or oral steroids - Observe - Can use OTC arnica  containing moisturizer such as Dermend Bruise Formula if desired - Call for worsening or other concerns arms Multiple benign nevi  Lentigines  Seborrheic keratoses  Cherry angioma  Actinic elastosis  Solar purpura (HCC)  Notalgia paresthetica   HISTORY OF PRECANCEROUS ACTINIC KERATOSIS - site(s) of PreCancerous Actinic Keratosis clear today. - these may recur and new lesions may form requiring treatment to prevent transformation into skin cancer - observe for new or changing spots and contact [facility] for appointment if occur - photoprotection with sun protective clothing; sunglasses and broad spectrum sunscreen with SPF of at least 30 + and frequent self skin exams recommended - yearly exams by a dermatologist recommended for persons with history of PreCancerous Actinic Keratoses   Return in about 1 year (around 05/29/2024) for TBSE, Hx of SCC, Hx of AKs.  I, Ardis Rowan, RMA, am acting as scribe for Elie Goody, MD .   Documentation: I have reviewed the above documentation for accuracy and completeness, and I agree with the above.  Elie Goody, MD

## 2023-05-30 NOTE — Patient Instructions (Signed)
Notalgia paresthetica is a chronic condition affecting the skin of the back in which a pinched nerve along the spine causes itching or changes in sensation in an area of skin. This is usually accompanied by chronic rubbing or scratching often leaving the area of skin discolored and thickened. There is no cure, but there are some treatments which may help control the itch.   Over the counter (non-prescription) treatments for notalgia paresthetica include numbing creams like pramoxine or lidocaine which temporarily reduce itch or Capsaicin-containing creams which cause a burning sensation but which sometimes over time will reset the nerves to stop producing itch.  If you choose to use Capsaicin cream, it is recommended to use it 5 times daily for 1 week followed by 3 times daily for 3-6 weeks. You may have to continue using it long-term. - If not doing well with OTC options, could consider [employer] compounded prescription anti-itch cream with Amitriptyline 5% / Lidocaine 5% / Pramoxine 1% or Amitriptyline 5% / Gabapentin 10% / Lidocaine 5% Cream. For severe cases, there are some prescription cream or pill options which may help. Other treatment options include: - Transcutaneous Electrical Nerve Stimulation (TENS) - Gabapentin 300-900 mg daily po - Amitriptyline orally - Paravertebral local anesthetic block - intralesional Botulinum toxin A    Due to recent changes in healthcare laws, you may see results of your pathology and/or laboratory studies on MyChart before the doctors have had a chance to review them. We understand that in some cases there may be results that are confusing or concerning to you. Please understand that not all results are received at the same time and often the doctors may need to interpret multiple results in order to provide you with the best plan of care or course of treatment. Therefore, we ask that you please give [place] 2 business days to thoroughly review all your results  before contacting the office for clarification. Should we see a critical lab result, you will be contacted sooner.   If You Need Anything After Your Visit  If you have any questions or concerns for your doctor, please call our main line at [phone] and press option 4 to reach your doctor's medical assistant. If no one answers, please leave a voicemail as directed and we will return your call as soon as possible. Messages left after 4 pm will be answered the following business day.   You may also send [place] a message via MyChart. We typically respond to MyChart messages within 1-2 business days.  For prescription refills, please ask your pharmacy to contact our office. Our fax number is [phone].  If you have an urgent issue when the clinic is closed that cannot wait until the next business day, you can page your doctor at the number below.    Please note that while we do our best to be available for urgent issues outside of office hours, we are not available 24/7.   If you have an urgent issue and are unable to reach [place], you may choose to seek medical care at your doctor's office, retail clinic, urgent care center, or emergency room.  If you have a medical emergency, please immediately call 911 or go to the emergency department.  Pager Numbers  - Dr. Gwen Pounds: [phone]  - Dr. Roseanne Reno: [phone]  - Dr. Katrinka Blazing: [phone]   In the event of inclement weather, please call our main line at [phone] for an update on the status of any delays or closures.  Dermatology  Medication Tips: Please keep the boxes that topical medications come in in order to help keep track of the instructions about where and how to use these. Pharmacies typically print the medication instructions only on the boxes and not directly on the medication tubes.   If your medication is too expensive, please contact our office at [phone] option 4 or send [place] a message through MyChart.   We are unable  to tell what your co-pay for medications will be in advance as this is different depending on your insurance coverage. However, we may be able to find a substitute medication at lower cost or fill out paperwork to get insurance to cover a needed medication.   If a prior authorization is required to get your medication covered by your insurance company, please allow [place] 1-2 business days to complete this process.  Drug prices often vary depending on where the prescription is filled and some pharmacies may offer cheaper prices.  The website www.goodrx.com contains coupons for medications through different pharmacies. The prices here do not account for what the cost may be with help from insurance (it may be cheaper with your insurance), but the website can give you the price if you did not use any insurance.  - You can print the associated coupon and take it with your prescription to the pharmacy.  - You may also stop by our office during regular business hours and pick up a GoodRx coupon card.  - If you need your prescription sent electronically to a different pharmacy, notify our office through [facility] or by phone at [phone] option 4.     Si Usted Necesita Algo Despus de Su Visita  Tambin puede enviarnos un mensaje a travs de Clinical cytogeneticist. Por lo general respondemos a los mensajes de MyChart en el transcurso de 1 a 2 das hbiles.  Para renovar recetas, por favor pida a su farmacia que se ponga en contacto con nuestra oficina. Annie Sable de fax es [place] [phone].  Si tiene un asunto urgente cuando la clnica est cerrada y que no puede esperar hasta el siguiente da hbil, puede llamar/localizar a su doctor(a) al nmero que aparece a continuacin.   Por favor, tenga en cuenta que aunque hacemos todo lo posible para estar disponibles para asuntos urgentes fuera del horario de [place], no estamos disponibles las 24 horas del da, los 7 [address]  [address] de la [place].   Si tiene un  problema urgente y no puede comunicarse con nosotros, puede optar por buscar atencin mdica  en el consultorio de su doctor(a), en una clnica privada, en un centro de atencin urgente o en una sala de emergencias.  Si tiene Engineer, drilling, por favor llame inmediatamente al 911 o vaya a la sala de emergencias.  Nmeros de bper  - Dr. Gwen Pounds: [phone]  - Dra. Roseanne Reno: [phone]  - Dr. Katrinka Blazing: [phone]   En caso de inclemencias del tiempo, por favor llame a Lacy Duverney principal al [phone] para una actualizacin sobre el [place] de cualquier retraso o cierre.  Consejos para la medicacin en dermatologa: Por favor, guarde las cajas en las que vienen los medicamentos de uso tpico para ayudarle a seguir las instrucciones sobre dnde y cmo usarlos. Las farmacias generalmente imprimen las instrucciones del medicamento slo en las cajas y no directamente en los tubos del [place].   Si su medicamento es muy caro, por favor, pngase en contacto con Rolm Gala llamando al [phone] y presione la opcin 4 o envenos un  mensaje a travs de MyChart.   No podemos decirle cul ser su copago por los medicamentos por adelantado ya que esto es diferente dependiendo de la cobertura de su seguro. Sin embargo, es posible que podamos encontrar un medicamento sustituto a Audiological scientist un formulario para que el seguro cubra el medicamento que se considera necesario.   Si se requiere una autorizacin previa para que su compaa de seguros [place] su medicamento, por favor permtanos de 1 a 2 das hbiles para completar [address]  Los precios de los medicamentos varan con frecuencia dependiendo del Environmental consultant de dnde se surte la receta y alguna farmacias pueden ofrecer precios ms baratos.  El sitio web www.goodrx.com tiene cupones para medicamentos de Health and safety inspector. Los precios aqu no tienen en cuenta lo que podra costar con la ayuda del seguro (puede ser ms  barato con su seguro), pero el sitio web puede darle el precio si no utiliz Tourist information centre manager.  - Puede imprimir el cupn correspondiente y llevarlo con su receta a la farmacia.  - Tambin puede pasar por nuestra oficina durante el horario de atencin regular y Education officer, museum una tarjeta de cupones de GoodRx.  - Si necesita que su receta se enve electrnicamente a una farmacia diferente, informe a nuestra oficina a travs de MyChart de [place] o por telfono llamando al [phone] y presione la opcin 4.

## 2024-02-16 NOTE — Progress Notes (Unsigned)
 Cardiology Office Note    Date:  02/19/2024   ID:  Cynthia Collins, DOB Apr 25, 1946, MRN [id number]  PCP:  Gretta Comer POUR, NP  Cardiologist:  Deatrice Cage, MD  Electrophysiologist:  None   Chief Complaint: Follow-up  History of Present Illness:   Cynthia Collins is a 78 y.o. female with history of paroxysmal SVT, severe hyperlipidemia, anxiety with whitecoat syndrome and underlying panic attack disorder, and family history of CAD who presents for follow up of PSVT.  Prior echo in 10/2019 for evaluation of murmur showed an EF of 55 to 60%, no regional wall motion abnormalities, grade 1 diastolic dysfunction, normal RV systolic function and ventricular cavity size, normal PASP, mild aortic valve sclerosis without evidence of stenosis, and an estimated right atrial pressure of 3 mmHg.  Murmur noted was likely in the setting of mild aortic valve sclerosis.  She was referred to Dr. Cage for evaluation of cardiovascular disease given severe hyperlipidemia with an LDL of greater than 200 historically.  She has chronic generalized myalgias and arthralgias with prior refusal of statin therapy.  Prior rheumatology work-up unrevealing.  Coronary artery calcium score in 12/2019 of 1118 which was the 96th percentile.  At baseline, she was active walking approximately 4 miles per day without exertional symptoms and worked out with [employer].  Lexiscan  MPI in 01/2020 showed a moderate in size, severe, fixed defect involving the inferior septal wall most consistent with scar though could not rule out artifact without significant ischemia being identified.  EF greater than 65%.  Attenuation corrected CT images demonstrated coronary artery calcification and aortic atherosclerosis.  Overall, this was an abnormal, probably low risk nuclear stress test.   With regards to her hyperlipidemia, she has not been interested in trying a statin and has documented intolerance to red yeast rice and Zetia .  She has been  evaluated by the lipid clinic with recommendation for PCSK9 inhibitor.  She preferred to research this prior to initiating therapy and when she was seen in 01/2021, she indicated she did not want to move forward with PCSK9 inhibitor.   She was seen in 01/2021 noting a 2 to 3-week history of intermittent randomly occurring palpitations that would occur approximately 3 times per day and last for several seconds, followed by spontaneous resolution.  There were no associated symptoms.  She did wonder if some recent holistic diet changes were contributing to her symptoms.  Zio patch in 02/2021 showed a predominant rhythm of sinus with an average heart rate of 70 bpm (range 42 to 136 bpm), 2 runs of SVT with the fastest and longest interval lasting just 8 beats, and rare PACs and PVCs.  Patient triggered events did not correlate with arrhythmia.   She was last seen in the office in 04/2022 and remained without symptoms of angina or cardiac decompensation.  She was active at baseline, walking approximately 3 miles per day which was decreased due to summer heat.  She continued to lift weights on a regular basis.  She preferred to avoid prescription medications.  She comes in doing well from a cardiac perspective and is without symptoms of angina or cardiac decompensation.  No palpitations, dizziness, presyncope, or syncope she remains active at baseline, walking 3 miles per day in the summer months and 4 miles per day when the weather is cooler without cardiac limitation or symptoms of chest pain.  She reports an episode of chest discomfort last week that lasted 5 minutes and occurred at rest.  She does  wonder if this was anxiety.  She has been without symptoms of exertional chest pain and has not been limited in her regular workout routine.  She continues to prefer to avoid prescription medications and is following a heart healthy diet.  No significant lower extremity swelling, falls, or symptoms concerning for bleeding.   Blood pressures at home have been well-controlled in the low 100s to 1 teens systolic.  She reports a history of whitecoat hypertension.   Labs independently reviewed: 02/2023 - magnesium 2.0, A1c 5.8, potassium 4.3, BUN 21, serum creatinine 0.8, albumin 4.3, AST/ALT normal, TC 279, TG 88, HDL 73, LDL 188 09/2022 - Hgb 13.6, PLT 242 02/2022 - TSH normal  Past Medical History:  Diagnosis Date   Actinic keratosis    Allergy    Anxiety    Complication of anesthesia    slow to wake up   Heart murmur    Hyperlipidemia    Paroxysmal SVT (supraventricular tachycardia) (HCC)    Pre-diabetes    Prediabetes    Squamous cell carcinoma of skin 08/03/2021   Mid chest - [facility] ED&C 09/07/21    Past Surgical History:  Procedure Laterality Date   APPENDECTOMY  08/20/1968   CESAREAN SECTION  08/20/1984   OOPHORECTOMY Right 08/20/1968   ORIF HUMERUS FRACTURE Right 09/20/2022   Procedure: OPEN REDUCTION INTERNAL FIXATION (ORIF) DISTAL HUMERUS FRACTURE;  Surgeon: Celena Sharper, MD;  Location: [facility] OR;  Service: Orthopedics;  Laterality: Right;   SKIN CANCER EXCISION      Current Medications: Current Meds  Medication Sig   ALPRAZolam  (XANAX ) 0.25 MG tablet Take 1 tablet by mouth daily as needed for anxiety. Use sparingly.   Ascorbic Acid (VITAMIN C) 1000 MG tablet Take 1,000 mg by mouth daily.   Berberine Chloride 500 MG CAPS Take 500 mg by mouth every Monday, Wednesday, and Friday.   Coenzyme Q10 (CO Q 10 PO) Take by mouth. Three times weekly.   ELDERBERRY PO Take 1 tablet by mouth daily as needed (cold symptoms).   GARLIC PO Take by mouth daily.   Hyaluronic Acid-Vitamin C (HYALURONIC ACID PO) Take 100 mg by mouth daily.   Menaquinone-7 (VITAMIN K2 PO) Take by mouth daily.   Misc Natural Products (LUTEIN 20 PO) Take 20 mg by mouth daily.   Misc Natural Products (WHITE WILLOW BARK PO) Take by mouth. Patient states she takes this for pain PRN   Multiple Vitamin (MULTIVITAMIN) tablet Take 1 tablet  by mouth in the morning and at bedtime.   NATTOKINASE PO Take 1 capsule by mouth daily.   NON FORMULARY Take 1 capsule by mouth daily. Haw[employer]orn Supreme   NON FORMULARY BCQ 3 days a week.   NON FORMULARY Collagen Powder Takes 1 scoop daily.   Omega-3 Fatty Acids (FISH OIL PO) Take by mouth. 4 times a week.   RESVERATROL PO Take 1 capsule by mouth 3 (three) times a week.   TURMERIC CURCUMIN PO Take 1 capsule by mouth daily.   VITAMIN D , CHOLECALCIFEROL, PO Take by mouth daily.   Whey Protein POWD Take 1 Scoop by mouth daily.   Zinc 50 MG CAPS Take 50 mg by mouth as needed (cold symptoms).    Allergies:   Red yeast rice extract, Statins, Zetia  [ezetimibe ], and Zoloft  [sertraline ]   Social History   Socioeconomic History   Marital status: Divorced    Spouse name: Not on file   Number of children: Not on file   Years of education: Not on file  Highest education level: Not on file  Occupational History   Not on file  Tobacco Use   Smoking status: Never   Smokeless tobacco: Never  Vaping Use   Vaping status: Never Used  Substance and Sexual Activity   Alcohol use: Not Currently    Alcohol/week: 1.0 standard drink of alcohol    Types: 1 Cans of beer per week    Comment: Once a month    Drug use: Never   Sexual activity: Not on file  Other Topics Concern   Not on file  Social History Narrative   Not on file   Social Drivers of Health   Financial Resource Strain: Low Risk  (02/28/2023)   Overall Financial Resource Strain (CARDIA)    Difficulty of Paying Living Expenses: Not hard at all  Food Insecurity: No Food Insecurity (02/28/2023)   Hunger Vital Sign    Worried About Running Out of Food in the Last Year: Never true    Ran Out of Food in the Last Year: Never true  Transportation Needs: No Transportation Needs (02/28/2023)   PRAPARE - Administrator, Civil Service (Medical): No    Lack of Transportation (Non-Medical): No  Physical Activity: Sufficiently Active  (02/28/2023)   Exercise Vital Sign    Days of Exercise per Week: 7 days    Minutes of Exercise per Session: 120 min  Stress: No Stress Concern Present (02/28/2023)   [employer] of Occupational Health - Occupational Stress Questionnaire    Feeling of Stress : Not at all  Social Connections: Socially Isolated (02/28/2023)   Social Connection and Isolation Panel    Frequency of Communication with Friends and Family: More than three times a week    Frequency of Social Gatherings with Friends and Family: More than three times a week    Attends Religious Services: Never    Database administrator or Organizations: No    Attends Banker Meetings: Never    Marital Status: Divorced     Family History:  The patient's family history includes Alcohol abuse in her mother and sister; Arthritis in her mother; Diabetes in her maternal grandfather; Yvone' disease in her sister; Hearing loss in her father; Heart disease in her father, maternal grandmother, mother, and paternal grandmother; Hypertension in her mother; Hypothyroidism in her mother; Lung cancer in her sister; Mental illness in her mother; Stroke in her father and mother. There is no history of Breast cancer.  ROS:   12-point review of systems is negative unless otherwise noted in the HPI.   EKGs/Labs/Other Studies Reviewed:    Studies reviewed were summarized above. The additional studies were reviewed today:  Zio patch 01/2021: Patient had a min HR of 42 bpm, max HR of 136 bpm, and avg HR of 70 bpm. Predominant underlying rhythm was Sinus Rhythm.  2 Supraventricular Tachycardia runs occurred, the run with the fastest interval lasting 8 beats with a max rate of 136 bpm, the longest lasting 8 beats with an avg rate of 106 bpm.  Rare PACs and rare PVCs. Triggered events did not correlate with arrhythmia. __________  Lexiscan  MPI 01/2020: Abnormal, probably low risk exercise myocardial perfusion stress test. There is a  moderate in size, severe, fixed defect involving the inferoseptal wall most consistent with scar but cannot rule out artifact. No significant ischemia was identified. The left ventricular ejection fraction is normal (>65%). Attenuation correction CT demonstrates coronary artery calcification and aortic atherosclerosis. __________   Calcium score  12/2019: 1. Coronary calcium score of 1118 . This was 96th percentile for age and sex matched control. 2. High calcium score. Recommend guideline directed medical therapy and risk factor modification. __________   2D echo 10/2019: 1. Left ventricular ejection fraction, by estimation, is 55 to 60%. The  left ventricle has normal function. The left ventricle has no regional  wall motion abnormalities. Left ventricular diastolic parameters are  consistent with Grade I diastolic  dysfunction (impaired relaxation).   2. Right ventricular systolic function is normal. The right ventricular  size is normal. There is normal pulmonary artery systolic pressure.   3. The mitral valve is normal in structure. No evidence of mitral valve  regurgitation. No evidence of mitral stenosis.   4. The aortic valve is tricuspid. Aortic valve regurgitation is not  visualized. Mild aortic valve sclerosis is present, with no evidence of  aortic valve stenosis.   5. The inferior vena cava is normal in size with greater than 50%  respiratory variability, suggesting right atrial pressure of 3 mmHg.   EKG:  EKG is ordered today.  The EKG ordered today demonstrates NSR, 69 bpm, no acute ST-T changes  Recent Labs: 03/14/2023: ALT 15; BUN 21; Creatinine, Ser 0.80; Magnesium 2.0; Potassium 4.3; Sodium 140  Recent Lipid Panel    Component Value Date/Time   CHOL 279 (H) 03/14/2023 0947   CHOL 281 (H) 09/30/2020 0835   TRIG 88.0 03/14/2023 0947   HDL 73.40 03/14/2023 0947   HDL 78 09/30/2020 0835   CHOLHDL 4 03/14/2023 0947   VLDL 17.6 03/14/2023 0947   LDLCALC 188 (H)  03/14/2023 0947   LDLCALC 191 (H) 09/30/2020 0835    PHYSICAL EXAM:    VS:  BP 138/70 (BP Location: Left Arm, Patient Position: Sitting, Cuff Size: Normal)   Pulse 69   Ht 5' (1.524 m)   Wt 114 lb (51.7 kg)   SpO2 95%   BMI 22.26 kg/m   BMI: Body mass index is 22.26 kg/m.  Physical Exam Vitals reviewed.  Constitutional:      Appearance: She is well-developed.  HENT:     Head: Normocephalic and atraumatic.  Eyes:     General:        Right eye: No discharge.        Left eye: No discharge.  Neck:     Vascular: No carotid bruit.  Cardiovascular:     Rate and Rhythm: Normal rate and regular rhythm.     Pulses:          Dorsalis pedis pulses are 2+ on the right side and 2+ on the left side.       Posterior tibial pulses are 2+ on the right side and 2+ on the left side.     Heart sounds: S1 normal and S2 normal. Heart sounds not distant. No midsystolic click and no opening snap. Murmur heard.     Systolic murmur is present with a grade of 1/6 at the upper right sternal border.     No friction rub.  Pulmonary:     Effort: Pulmonary effort is normal. No respiratory distress.     Breath sounds: Normal breath sounds. No decreased breath sounds, wheezing, rhonchi or rales.  Chest:     Chest wall: No tenderness.  Musculoskeletal:     Cervical back: Normal range of motion.     Right lower leg: No edema.     Left lower leg: No edema.  Skin:    General: Skin  is warm and dry.     Nails: There is no clubbing.  Neurological:     Mental Status: She is alert and oriented to person, place, and time.  Psychiatric:        Speech: Speech normal.        Behavior: Behavior normal.        Thought Content: Thought content normal.        Judgment: Judgment normal.     Wt Readings from Last 3 Encounters:  02/19/24 114 lb (51.7 kg)  03/14/23 117 lb (53.1 kg)  02/28/23 116 lb (52.6 kg)     ASSESSMENT & PLAN:   Palpitations/PSVT: Quiescent.  Not on AV nodal blocking medication.  CAD  involving the native coronary arteries without angina with associated severe hyperlipidemia: She remains very active at baseline without symptoms concerning for angina or cardiac decompensation.  She did have an episode of chest discomfort lasting 5 minutes that occurred at rest this past week and has been asymptomatic since.  Suspect this was more related to anxiety.  She would like to monitor symptoms and notify us  if symptoms return or become progressive.  At that time, could consider GXT, as she prefers to avoid pharmacological stress test.  She is statin intolerant, and prefers to avoid prescription medications including alternative medications to statins.  Aortic valve sclerosis: No evidence of stenosis on echo.  Elevated blood pressure: Is mildly elevated in the office with initial blood pressure 158/85 with recheck blood pressure 138/70.  Home BP readings are in the low 100s to 1 teens systolic.  She has a history of whitecoat hypertension.     Disposition: F/u with Dr. Darron or an APP in 12 months.   Medication Adjustments/Labs and Tests Ordered: Current medicines are reviewed at length with the patient today.  Concerns regarding medicines are outlined above. Medication changes, Labs and Tests ordered today are summarized above and listed in the Patient Instructions accessible in Encounters.   Signed, Bernardino Bring, PA-C 02/19/2024 3:18 PM     [facility] [place] [address] [place], [place] [address] [phone]

## 2024-02-19 ENCOUNTER — Ambulatory Visit: Attending: Physician Assistant | Admitting: Physician Assistant

## 2024-02-19 ENCOUNTER — Encounter: Payer: Self-pay | Admitting: Physician Assistant

## 2024-02-19 VITALS — BP 138/70 | HR 69 | Ht 60.0 in | Wt 114.0 lb

## 2024-02-19 DIAGNOSIS — I471 Supraventricular tachycardia, unspecified: Secondary | ICD-10-CM | POA: Insufficient documentation

## 2024-02-19 DIAGNOSIS — R002 Palpitations: Secondary | ICD-10-CM | POA: Diagnosis not present

## 2024-02-19 DIAGNOSIS — I2510 Atherosclerotic heart disease of native coronary artery without angina pectoris: Secondary | ICD-10-CM | POA: Diagnosis not present

## 2024-02-19 DIAGNOSIS — I358 Other nonrheumatic aortic valve disorders: Secondary | ICD-10-CM | POA: Diagnosis present

## 2024-02-19 NOTE — Patient Instructions (Signed)
 Medication Instructions:  Your physician recommends that you continue on your current medications as directed. Please refer to the Current Medication list given to you today.   *If you need a refill on your cardiac medications before your next appointment, please call your pharmacy*  Lab Work: None ordered at this time   Follow-Up: At [facility], you and your health needs are our priority.  As part of our continuing mission to provide you with exceptional heart care, our providers are all part of one team.  This team includes your primary Cardiologist (physician) and Advanced Practice Providers or APPs (Physician Assistants and Nurse Practitioners) who all work together to provide you with the care you need, when you need it.  Your next appointment:   12 month(s)  Provider:   You may see Deatrice Cage, MD or Bernardino Bring, PA-C

## 2024-03-03 ENCOUNTER — Ambulatory Visit (INDEPENDENT_AMBULATORY_CARE_PROVIDER_SITE_OTHER): Payer: Medicare Other

## 2024-03-03 VITALS — Ht 63.0 in | Wt 114.0 lb

## 2024-03-03 DIAGNOSIS — Z0000 Encounter for general adult medical examination without abnormal findings: Secondary | ICD-10-CM

## 2024-03-03 NOTE — Patient Instructions (Addendum)
 Cynthia Collins , Thank you for taking time out of your busy schedule to complete your Annual Wellness Visit with me. I enjoyed our conversation and look forward to speaking with you again next year. I, as well as your care team,  appreciate your ongoing commitment to your health goals. Please review the following plan we discussed and let me know if I can assist you in the future. Your Game plan/ To Do List    Follow up Visits: Next Medicare AWV with our clinical staff: 03/04/24 @ 10:10am televisit   Have you seen your provider in the last 6 months (3 months if uncontrolled diabetes)? No Next Office Visit with your provider: 04/02/24 Physical  Clinician Recommendations:  Aim for 30 minutes of exercise or brisk walking, 6-8 glasses of water , and 5 servings of fruits and vegetables each day.       This is a list of the screening recommended for you and due dates:  Health Maintenance  Topic Date Due   Hepatitis C Screening  Never done   Pneumococcal Vaccine for age over 54 (1 of 2 - PCV) Never done   Zoster (Shingles) Vaccine (1 of 2) Never done   COVID-19 Vaccine (3 - Pfizer risk series) 01/27/2020   Flu Shot  03/20/2024   Medicare Annual Wellness Visit  03/03/2025   DTaP/Tdap/Td vaccine (3 - Td or Tdap) 05/19/2030   DEXA scan (bone density measurement)  Completed   Hepatitis B Vaccine  Aged Out   HPV Vaccine  Aged Out   Meningitis B Vaccine  Aged Out   Colon Cancer Screening  Discontinued    Advanced directives: (In Chart) A copy of your advanced directives are scanned into your chart should your provider ever need it. Advance Care Planning is important because it:  [x]  Makes sure you receive the medical care that is consistent with your values, goals, and preferences  [x]  It provides guidance to your family and loved ones and reduces their decisional burden about whether or not they are making the right decisions based on your wishes.  Follow the link provided in your after visit  summary or read over the paperwork we have mailed to you to help you started getting your Advance Directives in place. If you need assistance in completing these, please reach out to us  so that we can help you!

## 2024-03-03 NOTE — Progress Notes (Signed)
 Subjective:   Cynthia Collins is a 78 y.o. who presents for a Medicare Wellness preventive visit.  As a reminder, Annual Wellness Visits don't include a physical exam, and some assessments may be limited, especially if this visit is performed virtually. We may recommend an in-person follow-up visit with your provider if needed.  Visit Complete: Virtual I connected with  Cynthia Collins on 03/03/24 by a audio enabled telemedicine application and verified that I am speaking with the correct person using two identifiers.  Patient Location: Home  Provider Location: Office/Clinic  I discussed the limitations of evaluation and management by telemedicine. The patient expressed understanding and agreed to proceed.  Vital Signs: Because this visit was a virtual/telehealth visit, some criteria may be missing or patient reported. Any vitals not documented were not able to be obtained and vitals that have been documented are patient reported.  VideoDeclined- This patient declined Librarian, academic. Therefore the visit was completed with audio only.  Persons Participating in Visit: Patient.  AWV Questionnaire: Yes: Patient Medicare AWV questionnaire was completed by the patient on 02/28/24; I have confirmed that all information answered by patient is correct and no changes since this date.  Cardiac Risk Factors include: advanced age (>61men, >37 women);dyslipidemia;hypertension     Objective:    Today's Vitals   03/03/24 1002  Weight: 114 lb (51.7 kg)  Height: 5' 3 (1.6 m)   Body mass index is 20.19 kg/m.     03/03/2024   10:14 AM 02/28/2023    9:29 AM 09/20/2022    6:26 AM 09/01/2022   10:14 PM 01/26/2022   10:53 AM  Advanced Directives  Does Patient Have a Medical Advance Directive? Yes Yes Yes No Yes  Type of Estate agent of [place];Living will Healthcare Power of [place];Living will Healthcare Power of [place];Living will   Healthcare Power of [place];Living will  Does patient want to make changes to medical advance directive?   No - Patient declined    Copy of Healthcare Power of Attorney in Chart? Yes - validated most recent copy scanned in chart (See row information) No - copy requested   No - copy requested    Current Medications (verified) Outpatient Encounter Medications as of 03/03/2024  Medication Sig   ALPRAZolam  (XANAX ) 0.25 MG tablet Take 1 tablet by mouth daily as needed for anxiety. Use sparingly.   Ascorbic Acid (VITAMIN C) 1000 MG tablet Take 1,000 mg by mouth daily.   Berberine Chloride 500 MG CAPS Take 500 mg by mouth every Monday, Wednesday, and Friday.   Coenzyme Q10 (CO Q 10 PO) Take by mouth. Three times weekly.   ELDERBERRY PO Take 1 tablet by mouth daily as needed (cold symptoms).   GARLIC PO Take by mouth daily.   Hyaluronic Acid-Vitamin C (HYALURONIC ACID PO) Take 100 mg by mouth daily.   Menaquinone-7 (VITAMIN K2 PO) Take by mouth daily.   Misc Natural Products (LUTEIN 20 PO) Take 20 mg by mouth daily.   Misc Natural Products (WHITE WILLOW BARK PO) Take by mouth. Patient states she takes this for pain PRN   Multiple Vitamin (MULTIVITAMIN) tablet Take 1 tablet by mouth in the morning and at bedtime.   NATTOKINASE PO Take 1 capsule by mouth daily.   NON FORMULARY Take 1 capsule by mouth daily. [employer]   NON FORMULARY BCQ 3 days a week.   NON FORMULARY Collagen Powder Takes 1 scoop daily.   Omega-3 Fatty Acids (FISH OIL PO)  Take by mouth. 4 times a week.   RESVERATROL PO Take 1 capsule by mouth 3 (three) times a week.   TURMERIC CURCUMIN PO Take 1 capsule by mouth daily.   VITAMIN D , CHOLECALCIFEROL, PO Take by mouth daily.   Whey Protein POWD Take 1 Scoop by mouth daily.   Zinc 50 MG CAPS Take 50 mg by mouth as needed (cold symptoms).   No facility-administered encounter medications on file as of 03/03/2024.    Allergies (verified) Red yeast rice extract, Statins, Zetia   [ezetimibe ], and Zoloft  [sertraline ]   History: Past Medical History:  Diagnosis Date   Actinic keratosis    Allergy    Anxiety    Cataract    Complication of anesthesia    slow to wake up   Heart murmur    Hyperlipidemia    Paroxysmal SVT (supraventricular tachycardia) (HCC)    Pre-diabetes    Prediabetes    Squamous cell carcinoma of skin 08/03/2021   Mid chest - [facility] ED&C 09/07/21   Past Surgical History:  Procedure Laterality Date   APPENDECTOMY  08/20/1968   CESAREAN SECTION  08/20/1984   FRACTURE SURGERY     OOPHORECTOMY Right 08/20/1968   ORIF HUMERUS FRACTURE Right 09/20/2022   Procedure: OPEN REDUCTION INTERNAL FIXATION (ORIF) DISTAL HUMERUS FRACTURE;  Surgeon: Celena Sharper, MD;  Location: [facility] OR;  Service: Orthopedics;  Laterality: Right;   SKIN CANCER EXCISION     Family History  Problem Relation Age of Onset   Alcohol abuse Mother    Arthritis Mother    Heart disease Mother    Hypertension Mother    Mental illness Mother    Stroke Mother    Hypothyroidism Mother    Stroke Father    Heart disease Father    Hearing loss Father    Alcohol abuse Sister    Lung cancer Sister    Graves' disease Sister    Cancer Sister    Heart disease Maternal Grandmother    Diabetes Maternal Grandfather    Heart disease Paternal Grandmother    Breast cancer Neg Hx    Social History   Socioeconomic History   Marital status: Divorced    Spouse name: Not on file   Number of children: Not on file   Years of education: Not on file   Highest education level: Some college, no degree  Occupational History   Not on file  Tobacco Use   Smoking status: Never   Smokeless tobacco: Never  Vaping Use   Vaping status: Never Used  Substance and Sexual Activity   Alcohol use: Not Currently    Alcohol/week: 1.0 standard drink of alcohol    Types: 1 Cans of beer per week    Comment: Once a month    Drug use: Never   Sexual activity: Not on file  Other Topics Concern   Not  on file  Social History Narrative   Not on file   Social Drivers of Health   Financial Resource Strain: Low Risk  (02/28/2024)   Overall Financial Resource Strain (CARDIA)    Difficulty of Paying Living Expenses: Not hard at all  Food Insecurity: No Food Insecurity (02/28/2024)   Hunger Vital Sign    Worried About Running Out of Food in the Last Year: Never true    Ran Out of Food in the Last Year: Never true  Transportation Needs: No Transportation Needs (02/28/2024)   PRAPARE - Administrator, Civil Service (Medical): No  Lack of Transportation (Non-Medical): No  Physical Activity: Sufficiently Active (02/28/2024)   Exercise Vital Sign    Days of Exercise per Week: 7 days    Minutes of Exercise per Session: 70 min  Stress: No Stress Concern Present (02/28/2024)   [employer] of Occupational Health - Occupational Stress Questionnaire    Feeling of Stress: Only a little  Social Connections: Unknown (02/28/2024)   Social Connection and Isolation Panel    Frequency of Communication with Friends and Family: More than three times a week    Frequency of Social Gatherings with Friends and Family: More than three times a week    Attends Religious Services: 1 to 4 times per year    Active Member of [employer] or Organizations: Not on file    Attends Banker Meetings: Not on file    Marital Status: Divorced    Tobacco Counseling Counseling given: Not Answered    Clinical Intake:  Pre-visit preparation completed: Yes  Pain : No/denies pain     BMI - recorded: 20.19 Nutritional Status: BMI of 19-24  Normal Nutritional Risks: None Diabetes: No  Lab Results  Component Value Date   HGBA1C 5.8 03/14/2023   HGBA1C 6.1 03/08/2022   HGBA1C 6.0 01/30/2021     How often do you need to have someone help you when you read instructions, pamphlets, or other written materials from your doctor or pharmacy?: 1 - Never  Interpreter Needed?: No  Comments: lives  alone Information entered by :: B.Natahsa Lilyann,LPN   Activities of Daily Living     02/28/2024   12:54 PM  In your present state of health, do you have any difficulty performing the following activities:  Hearing? 0  Vision? 0  Difficulty concentrating or making decisions? 0  Walking or climbing stairs? 0  Dressing or bathing? 0  Doing errands, shopping? 0  Preparing Food and eating ? N  Using the Toilet? N  In the past six months, have you accidently leaked urine? N  Do you have problems with loss of bowel control? N  Managing your Medications? N  Managing your Finances? N  Housekeeping or managing your Housekeeping? N    Patient Care Team: Gretta Comer POUR, NP as PCP - General (Internal Medicine) Darron Deatrice LABOR, MD as PCP - Cardiology (Cardiology) Dingeldein, Elspeth, MD (Ophthalmology)  I have updated your Care Teams any recent Medical Services you may have received from other providers in the past year.     Assessment:   This is a routine wellness examination for Cynthia Collins.  Hearing/Vision screen Hearing Screening - Comments:: Pt says her hearing is good Vision Screening - Comments:: Pt says her vision is good w/glasses mostly reading   Goals Addressed             This Visit's Progress    Patient Stated       03/03/24- get over anxiety     Patient Stated   On track    03/03/24-Continue exercise daily.       Depression Screen     03/03/2024   10:07 AM 02/28/2023    9:28 AM 01/26/2022   10:54 AM  PHQ 2/9 Scores  PHQ - 2 Score 0 0 0    Fall Risk     02/28/2024   12:54 PM 03/14/2023    9:07 AM 02/28/2023    9:31 AM 02/25/2023   10:19 AM 01/26/2022   10:54 AM  Fall Risk   Falls in the past year?  0 0 1 1 0  Number falls in past yr: 0 0 0 0 0  Injury with Fall? 0 0 1 1 0  Comment   tripped in garden and broke arm.    Risk for fall due to : No Fall Risks No Fall Risks No Fall Risks  Medication side effect  Follow up Education provided;Falls prevention  discussed Falls evaluation completed Falls prevention discussed;Falls evaluation completed;Education provided  Falls evaluation completed;Education provided;Falls prevention discussed      Data saved with a previous flowsheet row definition    MEDICARE RISK AT HOME:  Medicare Risk at Home Any stairs in or around the home?: (Patient-Rptd) Yes If so, are there any without handrails?: (Patient-Rptd) No Home free of loose throw rugs in walkways, pet beds, electrical cords, etc?: (Patient-Rptd) No Adequate lighting in your home to reduce risk of falls?: (Patient-Rptd) Yes Life alert?: (Patient-Rptd) No Use of a cane, walker or w/c?: (Patient-Rptd) No Grab bars in the bathroom?: (Patient-Rptd) Yes Shower chair or bench in shower?: (Patient-Rptd) No Elevated toilet seat or a handicapped toilet?: (Patient-Rptd) No  TIMED UP AND GO:  Was the test performed?  No  Cognitive Function: 6CIT completed        03/03/2024   10:14 AM 02/28/2023    9:33 AM 01/26/2022   10:56 AM  6CIT Screen  What Year? 0 points 0 points 0 points  What month? 0 points 0 points 0 points  What time? 0 points 0 points 0 points  Count back from 20 0 points 0 points 0 points  Months in reverse 0 points 0 points 0 points  Repeat phrase 0 points 0 points 0 points  Total Score 0 points 0 points 0 points    Immunizations Immunization History  Administered Date(s) Administered   Influenza,inj,Quad PF,6+ Mos 06/02/2020   PFIZER(Purple Top)SARS-COV-2 Vaccination 11/30/2019, 12/30/2019   Tdap 08/20/2005, 05/19/2020    Screening Tests Health Maintenance  Topic Date Due   Hepatitis C Screening  Never done   Pneumococcal Vaccine: 50+ Years (1 of 2 - PCV) Never done   Zoster Vaccines- Shingrix (1 of 2) Never done   COVID-19 Vaccine (3 - Pfizer risk series) 01/27/2020   INFLUENZA VACCINE  03/20/2024   Medicare Annual Wellness (AWV)  03/03/2025   DTaP/Tdap/Td (3 - Td or Tdap) 05/19/2030   DEXA SCAN  Completed    Hepatitis B Vaccines  Aged Out   HPV VACCINES  Aged Out   Meningococcal B Vaccine  Aged Out   Colonoscopy  Discontinued    Health Maintenance  Health Maintenance Due  Topic Date Due   Hepatitis C Screening  Never done   Pneumococcal Vaccine: 50+ Years (1 of 2 - PCV) Never done   Zoster Vaccines- Shingrix (1 of 2) Never done   COVID-19 Vaccine (3 - Pfizer risk series) 01/27/2020   Health Maintenance Items Addressed: Vaccinations: declines all Influenza vaccine: recommend every Fall Pneumococcal vaccine: recommend once per lifetime Prevnar-20 Tdap vaccine: recommend every 10 years Shingles vaccine: recommend Shingrix which is 2 doses 2-6 months apart and over 90% effective     Covid-19: recommend 2 doses one month apart with a booster 6 months later   Additional Screening:  Vision Screening: Recommended annual ophthalmology exams for early detection of glaucoma and other disorders of the eye. Would you like a referral to an eye doctor? No    Dental Screening: Recommended annual dental exams for proper oral hygiene  Community Resource Referral / Chronic Care  Management: CRR required this visit?  No   CCM required this visit?  Appt scheduled with PCP   Plan:    I have personally reviewed and noted the following in the patient's chart:   Medical and social history Use of alcohol, tobacco or illicit drugs  Current medications and supplements including opioid prescriptions. Patient is not currently taking opioid prescriptions. Functional ability and status Nutritional status Physical activity Advanced directives List of other physicians Hospitalizations, surgeries, and ER visits in previous 12 months Vitals Screenings to include cognitive, depression, and falls Referrals and appointments  In addition, I have reviewed and discussed with patient certain preventive protocols, quality metrics, and best practice recommendations. A written personalized care plan for  preventive services as well as general preventive health recommendations were provided to patient.   Erminio LITTIE Saris, LPN   [id number]/84/7974   After Visit Summary: (MyChart) Due to this being a telephonic visit, the after visit summary with patients personalized plan was offered to patient via MyChart   Notes: Nothing significant to report at this time.

## 2024-04-02 ENCOUNTER — Ambulatory Visit (INDEPENDENT_AMBULATORY_CARE_PROVIDER_SITE_OTHER): Admitting: Primary Care

## 2024-04-02 ENCOUNTER — Encounter: Payer: Self-pay | Admitting: Primary Care

## 2024-04-02 VITALS — BP 148/76 | HR 79 | Temp 97.2°F | Ht 63.0 in | Wt 116.0 lb

## 2024-04-02 DIAGNOSIS — M810 Age-related osteoporosis without current pathological fracture: Secondary | ICD-10-CM | POA: Diagnosis not present

## 2024-04-02 DIAGNOSIS — E7849 Other hyperlipidemia: Secondary | ICD-10-CM

## 2024-04-02 DIAGNOSIS — F419 Anxiety disorder, unspecified: Secondary | ICD-10-CM

## 2024-04-02 DIAGNOSIS — R7303 Prediabetes: Secondary | ICD-10-CM

## 2024-04-02 DIAGNOSIS — I2510 Atherosclerotic heart disease of native coronary artery without angina pectoris: Secondary | ICD-10-CM

## 2024-04-02 LAB — HEMOGLOBIN A1C: Hgb A1c MFr Bld: 6.2 % (ref 4.6–6.5)

## 2024-04-02 LAB — LIPID PANEL
Cholesterol: 285 mg/dL — ABNORMAL HIGH (ref 0–200)
HDL: 80.2 mg/dL (ref >39.00)
LDL Cholesterol: 187 mg/dL — ABNORMAL HIGH (ref 0–99)
NonHDL: 204.87
Total CHOL/HDL Ratio: 4
Triglycerides: 89 mg/dL (ref 0.0–149.0)
VLDL: 17.8 mg/dL (ref 0.0–40.0)

## 2024-04-02 LAB — C-REACTIVE PROTEIN: CRP: <1 mg/dL (ref 0.5–20.0)

## 2024-04-02 LAB — MAGNESIUM: Magnesium: 2 mg/dL (ref 1.5–2.5)

## 2024-04-02 LAB — VITAMIN D 25 HYDROXY (VIT D DEFICIENCY, FRACTURES): VITD: 43.77 ng/mL (ref 30.00–100.00)

## 2024-04-02 NOTE — Assessment & Plan Note (Signed)
 Declines repeat bone density scan. Continue calcium and vitamin D .  Encouraged regular weight bearing exercise.

## 2024-04-02 NOTE — Assessment & Plan Note (Signed)
 Repeat lipid panel pending.  Declines all prescription treatment despite recommendations.

## 2024-04-02 NOTE — Patient Instructions (Signed)
 Stop by the lab prior to leaving today. I will notify you of your results once received.   It was a pleasure to see you today!

## 2024-04-02 NOTE — Assessment & Plan Note (Signed)
 Controlled.  No concerns today.

## 2024-04-02 NOTE — Assessment & Plan Note (Signed)
 Repeat A1C pending.  Continue regular exercise.

## 2024-04-02 NOTE — Assessment & Plan Note (Signed)
 Asymptomatic.   Following with cardiology, office notes reviewed from July 2025.  Refuses statin therapy and aspirin .

## 2024-04-02 NOTE — Progress Notes (Signed)
 Subjective:    Patient ID: Cynthia Collins, female    DOB: 04/19/1946, 78 y.o.   MRN: [id number]  HPI  Cynthia Collins is a very pleasant 78 y.o. female who presents today for follow-up of chronic conditions.  1) CAD/Hyperlipidemia: Following with cardiology, last office visit was in July 2025.  During this visit no changes were made to her regimen.  She continues to decline all statin therapy despite recommendations.  She denies chest pain, shortness of breath, dizziness.  2) Osteoporosis: Chronic.  Last density scan was in 2021.  She is no longer interested in bone density scans as she was told that her bones were too bad.  She is taking calcium and vitamin D  and K2.  She exercises regularly.   Immunizations: -Tetanus: Completed in 2021  -Shingles: Never completed, declines -Pneumonia: Never Completed, declines   Mammogram: Completed in June 2023 Bone Density Scan: Completed in April 2021, declines   BP Readings from Last 3 Encounters:  04/02/24 (!) 148/76  02/19/24 138/70  03/14/23 (!) 170/84   She is taking her BP at home which is running low 100s/60s.      Review of Systems  Constitutional:  Negative for unexpected weight change.  HENT:  Negative for rhinorrhea.   Respiratory:  Negative for shortness of breath.   Cardiovascular:  Negative for chest pain.  Gastrointestinal:  Negative for constipation and diarrhea.  Genitourinary:  Negative for difficulty urinating.  Musculoskeletal:  Negative for arthralgias and myalgias.  Skin:  Negative for rash.  Neurological:  Negative for dizziness and headaches.  Psychiatric/Behavioral:  The patient is not nervous/anxious.          Past Medical History:  Diagnosis Date   Actinic keratosis    Allergy    Anxiety    Cataract    Complication of anesthesia    slow to wake up   Heart murmur    Hyperlipidemia    Paroxysmal SVT (supraventricular tachycardia) (HCC)    Pre-diabetes    Prediabetes    Squamous cell  carcinoma of skin 08/03/2021   Mid chest - SCCIS [facility] 09/07/21    Social History   Socioeconomic History   Marital status: Divorced    Spouse name: Not on file   Number of children: Not on file   Years of education: Not on file   Highest education level: Some college, no degree  Occupational History   Not on file  Tobacco Use   Smoking status: Never   Smokeless tobacco: Never  Vaping Use   Vaping status: Never Used  Substance and Sexual Activity   Alcohol use: Not Currently    Alcohol/week: 1.0 standard drink of alcohol    Types: 1 Cans of beer per week    Comment: Once a month    Drug use: Never   Sexual activity: Not on file  Other Topics Concern   Not on file  Social History Narrative   Not on file   Social Drivers of Health   Financial Resource Strain: Low Risk  (02/28/2024)   Overall Financial Resource Strain (CARDIA)    Difficulty of Paying Living Expenses: Not hard at all  Food Insecurity: No Food Insecurity (02/28/2024)   Hunger Vital Sign    Worried About Running Out of Food in the Last Year: Never true    Ran Out of Food in the Last Year: Never true  Transportation Needs: No Transportation Needs (02/28/2024)   PRAPARE - Transportation    Lack of Transportation (  Medical): No    Lack of Transportation (Non-Medical): No  Physical Activity: Sufficiently Active (02/28/2024)   Exercise Vital Sign    Days of Exercise per Week: 7 days    Minutes of Exercise per Session: 70 min  Stress: No Stress Concern Present (02/28/2024)   [employer] of Occupational Health - Occupational Stress Questionnaire    Feeling of Stress: Only a little  Social Connections: Unknown (02/28/2024)   Social Connection and Isolation Panel    Frequency of Communication with Friends and Family: More than three times a week    Frequency of Social Gatherings with Friends and Family: More than three times a week    Attends Religious Services: 1 to 4 times per year    Active Member of [employer]  or Organizations: Not on file    Attends Banker Meetings: Not on file    Marital Status: Divorced  Intimate Partner Violence: Not At Risk (03/03/2024)   Humiliation, Afraid, Rape, and Kick questionnaire    Fear of Current or Ex-Partner: No    Emotionally Abused: No    Physically Abused: No    Sexually Abused: No    Past Surgical History:  Procedure Laterality Date   APPENDECTOMY  08/20/1968   CESAREAN SECTION  08/20/1984   FRACTURE SURGERY     OOPHORECTOMY Right 08/20/1968   ORIF HUMERUS FRACTURE Right 09/20/2022   Procedure: OPEN REDUCTION INTERNAL FIXATION (ORIF) DISTAL HUMERUS FRACTURE;  Surgeon: Celena Sharper, MD;  Location: [facility] OR;  Service: Orthopedics;  Laterality: Right;   SKIN CANCER EXCISION      Family History  Problem Relation Age of Onset   Alcohol abuse Mother    Arthritis Mother    Heart disease Mother    Hypertension Mother    Mental illness Mother    Stroke Mother    Hypothyroidism Mother    Stroke Father    Heart disease Father    Hearing loss Father    Alcohol abuse Sister    Lung cancer Sister    Yvone' disease Sister    Cancer Sister    Heart disease Maternal Grandmother    Diabetes Maternal Grandfather    Heart disease Paternal Grandmother    Breast cancer Neg Hx     Allergies  Allergen Reactions   Red Yeast Rice Extract     Muscle pain   Statins     Muscle pain  Joint pain     Zetia [Ezetimibe]     Nausea/stomach pain/gagging   Zoloft [Sertraline] Anxiety    Current Outpatient Medications on File Prior to Visit  Medication Sig Dispense Refill   ALPRAZolam (XANAX) 0.25 MG tablet Take 1 tablet by mouth daily as needed for anxiety. Use sparingly. 10 tablet 0   Ascorbic Acid (VITAMIN C) 1000 MG tablet Take 1,000 mg by mouth daily.     Berberine Chloride 500 MG CAPS Take 500 mg by mouth every Monday, Wednesday, and Friday.     Coenzyme Q10 (CO Q 10 PO) Take by mouth. Three times weekly.     ELDERBERRY PO Take 1 tablet by  mouth daily as needed (cold symptoms).     GARLIC PO Take by mouth daily.     Hyaluronic Acid-Vitamin C (HYALURONIC ACID PO) Take 100 mg by mouth daily.     Menaquinone-7 (VITAMIN K2 PO) Take by mouth daily.     Misc Natural Products (LUTEIN 20 PO) Take 20 mg by mouth daily.     Misc Natural Products (  WHITE WILLOW BARK PO) Take by mouth. Patient states she takes this for pain PRN     Multiple Vitamin (MULTIVITAMIN) tablet Take 1 tablet by mouth in the morning and at bedtime.     NATTOKINASE PO Take 1 capsule by mouth daily.     NON FORMULARY Take 1 capsule by mouth daily. [employer]     NON FORMULARY BCQ 3 days a week.     NON FORMULARY Collagen Powder Takes 1 scoop daily.     Omega-3 Fatty Acids (FISH OIL PO) Take by mouth. 4 times a week.     RESVERATROL PO Take 1 capsule by mouth 3 (three) times a week.     TURMERIC CURCUMIN PO Take 1 capsule by mouth daily.     VITAMIN D , CHOLECALCIFEROL, PO Take by mouth daily.     Whey Protein POWD Take 1 Scoop by mouth daily.     Zinc 50 MG CAPS Take 50 mg by mouth as needed (cold symptoms).     No current facility-administered medications on file prior to visit.    BP (!) 148/76   Pulse 79   Temp (!) 97.2 F (36.2 C) (Temporal)   Ht 5' 3 (1.6 m)   Wt 116 lb (52.6 kg)   SpO2 93%   BMI 20.55 kg/m  Objective:   Physical Exam Cardiovascular:     Rate and Rhythm: Normal rate and regular rhythm.  Pulmonary:     Effort: Pulmonary effort is normal.     Breath sounds: Normal breath sounds.  Abdominal:     General: Bowel sounds are normal.     Palpations: Abdomen is soft.     Tenderness: There is no abdominal tenderness.  Musculoskeletal:     Cervical back: Neck supple.  Skin:    General: Skin is warm and dry.  Neurological:     Mental Status: She is alert and oriented to person, place, and time.  Psychiatric:        Mood and Affect: Mood normal.           Assessment & Plan:  Coronary artery disease involving native heart  without angina pectoris, unspecified vessel or lesion type Assessment & Plan: Asymptomatic.   Following with cardiology, office notes reviewed from July 2025.  Refuses statin therapy and aspirin .   Orders: -     C-reactive protein -     Lipid panel -     Magnesium  Osteoporosis without current pathological fracture, unspecified osteoporosis type Assessment & Plan: Declines repeat bone density scan. Continue calcium and vitamin D .  Encouraged regular weight bearing exercise.   Orders: -     VITAMIN D  25 Hydroxy (Vit-D Deficiency, Fractures)  Familial hyperlipidemia Assessment & Plan: Repeat lipid panel pending.  Declines all prescription treatment despite recommendations.   Orders: -     C-reactive protein -     Lipid panel  Prediabetes Assessment & Plan: Repeat A1C pending.  Continue regular exercise.    Orders: -     Hemoglobin A1c  Anxiety Assessment & Plan: Controlled.- No concerns today.           Christpoher Sievers K Jenisha Faison, NP

## 2024-04-03 ENCOUNTER — Ambulatory Visit: Payer: Self-pay | Admitting: Primary Care

## 2024-04-03 DIAGNOSIS — E7849 Other hyperlipidemia: Secondary | ICD-10-CM

## 2024-04-03 DIAGNOSIS — R7303 Prediabetes: Secondary | ICD-10-CM

## 2024-04-09 ENCOUNTER — Ambulatory Visit: Payer: Self-pay | Admitting: Primary Care

## 2024-04-09 ENCOUNTER — Other Ambulatory Visit (INDEPENDENT_AMBULATORY_CARE_PROVIDER_SITE_OTHER)

## 2024-04-09 DIAGNOSIS — E7849 Other hyperlipidemia: Secondary | ICD-10-CM

## 2024-04-09 DIAGNOSIS — R7303 Prediabetes: Secondary | ICD-10-CM | POA: Diagnosis not present

## 2024-04-09 LAB — COMPREHENSIVE METABOLIC PANEL WITH GFR
ALT: 16 U/L (ref 0–35)
AST: 21 U/L (ref 0–37)
Albumin: 4.3 g/dL (ref 3.5–5.2)
Alkaline Phosphatase: 115 U/L (ref 39–117)
BUN: 22 mg/dL (ref 6–23)
CO2: 30 meq/L (ref 19–32)
Calcium: 9.3 mg/dL (ref 8.4–10.5)
Chloride: 103 meq/L (ref 96–112)
Creatinine, Ser: 0.85 mg/dL (ref 0.40–1.20)
GFR: 65.76 mL/min (ref >60.00)
Glucose, Bld: 94 mg/dL (ref 70–99)
Potassium: 4.2 meq/L (ref 3.5–5.1)
Sodium: 141 meq/L (ref 135–145)
Total Bilirubin: 0.7 mg/dL (ref 0.2–1.2)
Total Protein: 6.9 g/dL (ref 6.0–8.3)

## 2024-06-01 ENCOUNTER — Encounter: Payer: Self-pay | Admitting: Dermatology

## 2024-06-01 ENCOUNTER — Ambulatory Visit: Payer: Medicare Other | Admitting: Dermatology

## 2024-06-01 DIAGNOSIS — L821 Other seborrheic keratosis: Secondary | ICD-10-CM | POA: Diagnosis not present

## 2024-06-01 DIAGNOSIS — D229 Melanocytic nevi, unspecified: Secondary | ICD-10-CM

## 2024-06-01 DIAGNOSIS — W908XXA Exposure to other nonionizing radiation, initial encounter: Secondary | ICD-10-CM | POA: Diagnosis not present

## 2024-06-01 DIAGNOSIS — L814 Other melanin hyperpigmentation: Secondary | ICD-10-CM

## 2024-06-01 DIAGNOSIS — R202 Paresthesia of skin: Secondary | ICD-10-CM

## 2024-06-01 DIAGNOSIS — L578 Other skin changes due to chronic exposure to nonionizing radiation: Secondary | ICD-10-CM

## 2024-06-01 DIAGNOSIS — Z86007 Personal history of in-situ neoplasm of skin: Secondary | ICD-10-CM

## 2024-06-01 DIAGNOSIS — Z1283 Encounter for screening for malignant neoplasm of skin: Secondary | ICD-10-CM | POA: Diagnosis not present

## 2024-06-01 DIAGNOSIS — D1801 Hemangioma of skin and subcutaneous tissue: Secondary | ICD-10-CM

## 2024-06-01 DIAGNOSIS — Q825 Congenital non-neoplastic nevus: Secondary | ICD-10-CM

## 2024-06-01 NOTE — Patient Instructions (Signed)
 NOTALGIA PARESTHETICA  Chronic condition without cure secondary to pinched nerve along spine causing itching or sensation changes in an area of skin. Chronic rubbing or scratching causes darkening of the skin.  OTC treatments which can help with itch include numbing creams like pramoxine or lidocaine  which temporarily reduce itch or Capsaicin-containing creams which cause a burning sensation but which sometimes over time will reset the nerves to stop producing itch.  If you choose to use Capsaicin cream, it is recommended to use it 5 times daily for 1 week followed by 3 times daily for 3-6 weeks. You may have to continue using it long-term.    Melanoma ABCDEs  Melanoma is the most dangerous type of skin cancer, and is the leading cause of death from skin disease.  You are more likely to develop melanoma if you: Have light-colored skin, light-colored eyes, or red or blond hair Spend a lot of time in the sun Tan regularly, either outdoors or in a tanning bed Have had blistering sunburns, especially during childhood Have a close family member who has had a melanoma Have atypical moles or large birthmarks  Early detection of melanoma is key since treatment is typically straightforward and cure rates are extremely high if we catch it early.   The first sign of melanoma is often a change in a mole or a new dark spot.  The ABCDE system is a way of remembering the signs of melanoma.  A for asymmetry:  The two halves do not match. B for border:  The edges of the growth are irregular. C for color:  A mixture of colors are present instead of an even brown color. D for diameter:  Melanomas are usually (but not always) greater than 6mm - the size of a pencil eraser. E for evolution:  The spot keeps changing in size, shape, and color.  Please check your skin once per month between visits. You can use a small mirror in front and a large mirror behind you to keep an eye on the back side or your body.    If you see any new or changing lesions before your next follow-up, please call to schedule a visit.  Please continue daily skin protection including broad spectrum sunscreen SPF 30+ to sun-exposed areas, reapplying every 2 hours as needed when you're outdoors.    Due to recent changes in healthcare laws, you may see results of your pathology and/or laboratory studies on MyChart before the doctors have had a chance to review them. We understand that in some cases there may be results that are confusing or concerning to you. Please understand that not all results are received at the same time and often the doctors may need to interpret multiple results in order to provide you with the best plan of care or course of treatment. Therefore, we ask that you please give us  2 business days to thoroughly review all your results before contacting the office for clarification. Should we see a critical lab result, you will be contacted sooner.   If You Need Anything After Your Visit  If you have any questions or concerns for your doctor, please call our main line at [phone] and press option 4 to reach your doctor's medical assistant. If no one answers, please leave a voicemail as directed and we will return your call as soon as possible. Messages left after 4 pm will be answered the following business day.   You may also send us  a message via MyChart.  We typically respond to MyChart messages within 1-2 business days.  For prescription refills, please ask your pharmacy to contact our office. Our fax number is [phone].  If you have an urgent issue when the clinic is closed that cannot wait until the next business day, you can page your doctor at the number below.    Please note that while we do our best to be available for urgent issues outside of office hours, we are not available 24/7.   If you have an urgent issue and are unable to reach us , you may choose to seek medical care at your doctor's  office, retail clinic, urgent care center, or emergency room.  If you have a medical emergency, please immediately call 911 or go to the emergency department.  Pager Numbers  - Dr. Hester: [phone]  - Dr. Jackquline: [phone]  - Dr. Claudene: [phone]   - Dr. Raymund: [phone]  In the event of inclement weather, please call our main line at [phone] for an update on the status of any delays or closures.  Dermatology Medication Tips: Please keep the boxes that topical medications come in in order to help keep track of the instructions about where and how to use these. Pharmacies typically print the medication instructions only on the boxes and not directly on the medication tubes.   If your medication is too expensive, please contact our office at [phone] option 4 or send us  a message through MyChart.   We are unable to tell what your co-pay for medications will be in advance as this is different depending on your insurance coverage. However, we may be able to find a substitute medication at lower cost or fill out paperwork to get insurance to cover a needed medication.   If a prior authorization is required to get your medication covered by your insurance company, please allow us  1-2 business days to complete this process.  Drug prices often vary depending on where the prescription is filled and some pharmacies may offer cheaper prices.  The website www.goodrx.com contains coupons for medications through different pharmacies. The prices here do not account for what the cost may be with help from insurance (it may be cheaper with your insurance), but the website can give you the price if you did not use any insurance.  - You can print the associated coupon and take it with your prescription to the pharmacy.  - You may also stop by our office during regular business hours and pick up a GoodRx coupon card.  - If you need your prescription sent electronically to a  different pharmacy, notify our office through [facility] or by phone at [phone] option 4.

## 2024-06-01 NOTE — Progress Notes (Signed)
 Follow-Up Visit   Subjective  Cynthia Collins is a 78 y.o. female who presents for the following: Skin Cancer Screening and Full Body Skin Exam  The patient presents for Total-Body Skin Exam (TBSE) for skin cancer screening and mole check. The patient has spots, moles and lesions to be evaluated, some may be new or changing and the patient may have concern these could be cancer.  Hx SCCis. Patient with itchy back, mostly at left shoulder blade. Spot at right shoulder, thinks it could be where she had a bx done. Spot at right upper thigh and a few at right side,   The following portions of the chart were reviewed this encounter and updated as appropriate: medications, allergies, medical history  Review of Systems:  No other skin or systemic complaints except as noted in HPI or Assessment and Plan.  Objective  Well appearing patient in no apparent distress; mood and affect are within normal limits.  A full examination was performed including scalp, head, eyes, ears, nose, lips, neck, chest, axillae, abdomen, back, buttocks, bilateral upper extremities, bilateral lower extremities, hands, feet, fingers, toes, fingernails, and toenails. All findings within normal limits unless otherwise noted below.   Relevant physical exam findings are noted in the Assessment and Plan.    Assessment & Plan   SKIN CANCER SCREENING PERFORMED TODAY.  ACTINIC DAMAGE - Chronic condition, secondary to cumulative UV/sun exposure - diffuse scaly erythematous macules with underlying dyspigmentation - Recommend daily broad spectrum sunscreen SPF 30+ to sun-exposed areas, reapply every 2 hours as needed.  - Staying in the shade or wearing long sleeves, sun glasses (UVA+UVB protection) and wide brim hats (4-inch brim around the entire circumference of the hat) are also recommended for sun protection.  - Call for new or changing lesions.  LENTIGINES, SEBORRHEIC KERATOSES, HEMANGIOMAS - Benign normal skin  lesions - Benign-appearing - Call for any changes  MELANOCYTIC NEVI - Tan-brown and/or pink-flesh-colored symmetric macules and papules - Benign appearing on exam today - Observation - Call clinic for new or changing moles - Recommend daily use of broad spectrum spf 30+ sunscreen to sun-exposed areas.   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 Chronic rubbing or scratching causes darkening of the skin.  OTC treatments which can help with itch include numbing creams like pramoxine or lidocaine  which temporarily reduce itch or Capsaicin-containing creams which cause a burning sensation but which sometimes over time will reset the nerves to stop producing itch.  If you choose to use Capsaicin cream, it is recommended to use it 5 times daily for 1 week followed by 3 times daily for 3-6 weeks. You may have to continue using it long-term.  If not doing well with OTC options, could consider [facility] compounded prescription anti-itch cream with Amitriptyline 5% / Lidocaine  5% / Pramoxine 1% or Amitriptyline 5% / Gabapentin 10% / Lidocaine   5% Cream or other prescription cream or pill options.      Return in about 1 year (around 06/01/2025) for TBSE, with Dr. Claudene, HxSCCis.  LILLETTE Lonell Drones, RMA, am acting as scribe for Boneta Claudene, MD .   Documentation: I  have reviewed the above documentation for accuracy and completeness, and I agree with the above.  Boneta Claudene, MD

## 2025-03-04 ENCOUNTER — Ambulatory Visit

## 2025-03-05 ENCOUNTER — Ambulatory Visit

## 2025-06-01 ENCOUNTER — Ambulatory Visit: Admitting: Dermatology
